# Patient Record
Sex: Female | Born: 1975 | Race: Black or African American | Hispanic: No | Marital: Married | Smoking: Current some day smoker
Health system: Southern US, Community
[De-identification: ages and names within clinical notes are randomized; demographics above are authoritative.]

## PROBLEM LIST (undated history)

## (undated) DIAGNOSIS — I10 Essential (primary) hypertension: Secondary | ICD-10-CM

## (undated) DIAGNOSIS — F1910 Other psychoactive substance abuse, uncomplicated: Secondary | ICD-10-CM

## (undated) DIAGNOSIS — G4730 Sleep apnea, unspecified: Secondary | ICD-10-CM

## (undated) DIAGNOSIS — D649 Anemia, unspecified: Secondary | ICD-10-CM

## (undated) DIAGNOSIS — F419 Anxiety disorder, unspecified: Secondary | ICD-10-CM

## (undated) DIAGNOSIS — J45909 Unspecified asthma, uncomplicated: Secondary | ICD-10-CM

## (undated) DIAGNOSIS — G4733 Obstructive sleep apnea (adult) (pediatric): Secondary | ICD-10-CM

## (undated) DIAGNOSIS — K219 Gastro-esophageal reflux disease without esophagitis: Secondary | ICD-10-CM

## (undated) DIAGNOSIS — F32A Depression, unspecified: Secondary | ICD-10-CM

## (undated) DIAGNOSIS — E119 Type 2 diabetes mellitus without complications: Secondary | ICD-10-CM

## (undated) DIAGNOSIS — T7840XA Allergy, unspecified, initial encounter: Secondary | ICD-10-CM

## (undated) DIAGNOSIS — M1990 Unspecified osteoarthritis, unspecified site: Secondary | ICD-10-CM

## (undated) HISTORY — DX: Unspecified asthma, uncomplicated: J45.909

## (undated) HISTORY — PX: KNEE ARTHROSCOPY: SUR90

## (undated) HISTORY — DX: Gastro-esophageal reflux disease without esophagitis: K21.9

## (undated) HISTORY — DX: Sleep apnea, unspecified: G47.30

## (undated) HISTORY — DX: Unspecified osteoarthritis, unspecified site: M19.90

## (undated) HISTORY — DX: Type 2 diabetes mellitus without complications: E11.9

## (undated) HISTORY — PX: BICEPS TENDON REPAIR: SHX566

## (undated) HISTORY — DX: Anemia, unspecified: D64.9

## (undated) HISTORY — DX: Essential (primary) hypertension: I10

## (undated) HISTORY — DX: Other psychoactive substance abuse, uncomplicated: F19.10

## (undated) HISTORY — PX: ROTATOR CUFF REPAIR: SHX139

## (undated) HISTORY — DX: Anxiety disorder, unspecified: F41.9

## (undated) HISTORY — DX: Allergy, unspecified, initial encounter: T78.40XA

## (undated) HISTORY — PX: FOOT SURGERY: SHX648

## (undated) HISTORY — DX: Obstructive sleep apnea (adult) (pediatric): G47.33

## (undated) HISTORY — DX: Depression, unspecified: F32.A

---

## 2000-07-28 ENCOUNTER — Emergency Department (HOSPITAL_COMMUNITY): Admission: EM | Admit: 2000-07-28 | Discharge: 2000-07-28 | Payer: Self-pay | Admitting: Emergency Medicine

## 2000-12-29 ENCOUNTER — Emergency Department (HOSPITAL_COMMUNITY): Admission: EM | Admit: 2000-12-29 | Discharge: 2000-12-29 | Payer: Self-pay

## 2001-07-09 ENCOUNTER — Encounter: Payer: Self-pay | Admitting: Emergency Medicine

## 2001-07-09 ENCOUNTER — Emergency Department (HOSPITAL_COMMUNITY): Admission: EM | Admit: 2001-07-09 | Discharge: 2001-07-09 | Payer: Self-pay | Admitting: Emergency Medicine

## 2002-08-20 ENCOUNTER — Emergency Department (HOSPITAL_COMMUNITY): Admission: EM | Admit: 2002-08-20 | Discharge: 2002-08-20 | Payer: Self-pay | Admitting: Emergency Medicine

## 2002-08-20 ENCOUNTER — Encounter: Payer: Self-pay | Admitting: Emergency Medicine

## 2004-03-13 ENCOUNTER — Emergency Department (HOSPITAL_COMMUNITY): Admission: EM | Admit: 2004-03-13 | Discharge: 2004-03-13 | Payer: Self-pay | Admitting: Family Medicine

## 2004-08-30 ENCOUNTER — Emergency Department (HOSPITAL_COMMUNITY): Admission: EM | Admit: 2004-08-30 | Discharge: 2004-08-30 | Payer: Self-pay | Admitting: Emergency Medicine

## 2005-04-12 ENCOUNTER — Emergency Department (HOSPITAL_COMMUNITY): Admission: EM | Admit: 2005-04-12 | Discharge: 2005-04-12 | Payer: Self-pay | Admitting: Emergency Medicine

## 2005-05-09 ENCOUNTER — Emergency Department (HOSPITAL_COMMUNITY): Admission: EM | Admit: 2005-05-09 | Discharge: 2005-05-09 | Payer: Self-pay | Admitting: Family Medicine

## 2005-05-31 ENCOUNTER — Encounter: Admission: RE | Admit: 2005-05-31 | Discharge: 2005-05-31 | Payer: Self-pay | Admitting: Cardiology

## 2005-07-30 ENCOUNTER — Other Ambulatory Visit: Admission: RE | Admit: 2005-07-30 | Discharge: 2005-07-30 | Payer: Self-pay | Admitting: Obstetrics and Gynecology

## 2005-09-05 ENCOUNTER — Encounter (INDEPENDENT_AMBULATORY_CARE_PROVIDER_SITE_OTHER): Payer: Self-pay | Admitting: Specialist

## 2005-09-05 ENCOUNTER — Ambulatory Visit (HOSPITAL_COMMUNITY): Admission: RE | Admit: 2005-09-05 | Discharge: 2005-09-06 | Payer: Self-pay | Admitting: Obstetrics and Gynecology

## 2005-11-27 ENCOUNTER — Encounter: Admission: RE | Admit: 2005-11-27 | Discharge: 2005-11-27 | Payer: Self-pay | Admitting: Orthopedic Surgery

## 2005-12-17 ENCOUNTER — Encounter: Admission: RE | Admit: 2005-12-17 | Discharge: 2005-12-17 | Payer: Self-pay | Admitting: Orthopedic Surgery

## 2006-02-19 HISTORY — PX: ABDOMINAL HYSTERECTOMY: SHX81

## 2006-12-13 ENCOUNTER — Encounter: Admission: RE | Admit: 2006-12-13 | Discharge: 2006-12-13 | Payer: Self-pay | Admitting: Family Medicine

## 2008-01-24 ENCOUNTER — Emergency Department (HOSPITAL_COMMUNITY): Admission: EM | Admit: 2008-01-24 | Discharge: 2008-01-24 | Payer: Self-pay | Admitting: Emergency Medicine

## 2008-03-16 ENCOUNTER — Emergency Department (HOSPITAL_COMMUNITY): Admission: EM | Admit: 2008-03-16 | Discharge: 2008-03-16 | Payer: Self-pay | Admitting: Emergency Medicine

## 2008-04-16 ENCOUNTER — Ambulatory Visit (HOSPITAL_BASED_OUTPATIENT_CLINIC_OR_DEPARTMENT_OTHER): Admission: RE | Admit: 2008-04-16 | Discharge: 2008-04-16 | Payer: Self-pay | Admitting: Family Medicine

## 2008-04-25 ENCOUNTER — Ambulatory Visit: Payer: Self-pay | Admitting: Internal Medicine

## 2008-06-28 DIAGNOSIS — Z6841 Body Mass Index (BMI) 40.0 and over, adult: Secondary | ICD-10-CM

## 2008-06-28 DIAGNOSIS — E039 Hypothyroidism, unspecified: Secondary | ICD-10-CM | POA: Insufficient documentation

## 2008-06-28 DIAGNOSIS — R079 Chest pain, unspecified: Secondary | ICD-10-CM

## 2008-06-29 ENCOUNTER — Encounter: Payer: Self-pay | Admitting: Pulmonary Disease

## 2009-02-03 ENCOUNTER — Encounter: Admission: RE | Admit: 2009-02-03 | Discharge: 2009-02-03 | Payer: Self-pay | Admitting: Family Medicine

## 2009-09-07 ENCOUNTER — Emergency Department (HOSPITAL_COMMUNITY): Admission: EM | Admit: 2009-09-07 | Discharge: 2009-09-07 | Payer: Self-pay | Admitting: Emergency Medicine

## 2009-10-25 ENCOUNTER — Observation Stay (HOSPITAL_COMMUNITY): Admission: EM | Admit: 2009-10-25 | Discharge: 2009-10-26 | Payer: Self-pay | Admitting: Emergency Medicine

## 2009-10-25 ENCOUNTER — Encounter (INDEPENDENT_AMBULATORY_CARE_PROVIDER_SITE_OTHER): Payer: Self-pay | Admitting: Internal Medicine

## 2009-11-02 ENCOUNTER — Encounter: Admission: RE | Admit: 2009-11-02 | Discharge: 2009-11-02 | Payer: Self-pay | Admitting: Family Medicine

## 2009-11-27 ENCOUNTER — Encounter: Admission: RE | Admit: 2009-11-27 | Discharge: 2009-11-27 | Payer: Self-pay | Admitting: Orthopaedic Surgery

## 2010-05-04 LAB — BASIC METABOLIC PANEL
CO2: 25 mEq/L (ref 19–32)
Calcium: 9.2 mg/dL (ref 8.4–10.5)
Chloride: 106 mEq/L (ref 96–112)
Creatinine, Ser: 0.9 mg/dL (ref 0.4–1.2)
Glucose, Bld: 98 mg/dL (ref 70–99)

## 2010-05-04 LAB — DIFFERENTIAL
Basophils Relative: 1 % (ref 0–1)
Eosinophils Absolute: 0.1 10*3/uL (ref 0.0–0.7)
Eosinophils Relative: 1 % (ref 0–5)
Monocytes Relative: 7 % (ref 3–12)
Neutrophils Relative %: 45 % (ref 43–77)

## 2010-05-04 LAB — CARDIAC PANEL(CRET KIN+CKTOT+MB+TROPI)
CK, MB: 1 ng/mL (ref 0.3–4.0)
CK, MB: 1.1 ng/mL (ref 0.3–4.0)
Relative Index: 0.5 (ref 0.0–2.5)
Relative Index: 0.5 (ref 0.0–2.5)
Relative Index: 0.5 (ref 0.0–2.5)
Total CK: 179 U/L — ABNORMAL HIGH (ref 7–177)
Total CK: 207 U/L — ABNORMAL HIGH (ref 7–177)
Troponin I: 0.01 ng/mL (ref 0.00–0.06)
Troponin I: 0.02 ng/mL (ref 0.00–0.06)

## 2010-05-04 LAB — HEPATIC FUNCTION PANEL
Albumin: 3.1 g/dL — ABNORMAL LOW (ref 3.5–5.2)
Alkaline Phosphatase: 51 U/L (ref 39–117)
Indirect Bilirubin: 0.5 mg/dL (ref 0.3–0.9)
Total Protein: 6.4 g/dL (ref 6.0–8.3)

## 2010-05-04 LAB — CBC
Hemoglobin: 13 g/dL (ref 12.0–15.0)
MCH: 30.1 pg (ref 26.0–34.0)
MCHC: 34.3 g/dL (ref 30.0–36.0)
MCV: 87.8 fL (ref 78.0–100.0)
Platelets: 174 10*3/uL (ref 150–400)

## 2010-05-04 LAB — LIPASE, BLOOD: Lipase: 28 U/L (ref 11–59)

## 2010-05-04 LAB — LIPID PANEL
Cholesterol: 140 mg/dL (ref 0–200)
LDL Cholesterol: 82 mg/dL (ref 0–99)
Triglycerides: 44 mg/dL (ref <150)
VLDL: 9 mg/dL (ref 0–40)

## 2010-05-04 LAB — TSH: TSH: 2.693 u[IU]/mL (ref 0.350–4.500)

## 2010-05-04 LAB — POCT CARDIAC MARKERS: Myoglobin, poc: 88.3 ng/mL (ref 12–200)

## 2010-06-05 LAB — POCT CARDIAC MARKERS
CKMB, poc: <1 ng/mL — ABNORMAL LOW (ref 1.0–8.0)
Troponin i, poc: <0.05 ng/mL (ref 0.00–0.09)

## 2010-06-05 LAB — BASIC METABOLIC PANEL
BUN: 8 mg/dL (ref 6–23)
CO2: 24 mEq/L (ref 19–32)
Calcium: 9.1 mg/dL (ref 8.4–10.5)
Chloride: 105 mEq/L (ref 96–112)
Creatinine, Ser: 0.75 mg/dL (ref 0.4–1.2)
Glucose, Bld: 98 mg/dL (ref 70–99)

## 2010-06-05 LAB — CBC
Platelets: 189 10*3/uL (ref 150–400)
RDW: 13.7 % (ref 11.5–15.5)
WBC: 5 10*3/uL (ref 4.0–10.5)

## 2010-06-05 LAB — COMPREHENSIVE METABOLIC PANEL
ALT: 13 U/L (ref 0–35)
AST: 23 U/L (ref 0–37)
Albumin: 3.5 g/dL (ref 3.5–5.2)
Alkaline Phosphatase: 67 U/L (ref 39–117)
GFR calc Af Amer: >60 mL/min (ref >60)
Potassium: 3.9 mEq/L (ref 3.5–5.1)
Sodium: 136 mEq/L (ref 135–145)
Total Protein: 7.6 g/dL (ref 6.0–8.3)

## 2010-06-05 LAB — D-DIMER, QUANTITATIVE: D-Dimer, Quant: 0.8 ug/mL-FEU — ABNORMAL HIGH (ref 0.00–0.48)

## 2010-07-04 NOTE — Procedures (Signed)
Monica [name], Monica [name] NO.:  [id number]   MEDICAL RECORD NO.:  [id number]          PATIENT TYPE:  OUT   LOCATION:  SLEEP CENTER                 FACILITY:  [facility]   PHYSICIAN:  Clinton D. Maple Hudson, MD, FCCP, FACPDATE OF BIRTH:  1973/07/23   DATE OF STUDY:  04/16/2008                            NOCTURNAL POLYSOMNOGRAM   REFERRING PHYSICIAN:   REFERRING PHYSICIAN:  Lillia Carmel, MD   DATE OF THE STUDY:  April 16, 2008   INDICATION FOR STUDY:  Hypersomnia with sleep apnea.   EPWORTH SLEEPINESS SCORE:  10/24, BMI 39.  Weight 304 pounds, height 74  inches.  Neck 17 inches.   MEDICATIONS:  Charted and reviewed.   SLEEP ARCHITECTURE:  Split study protocol.  During the diagnostic phase,  total sleep time was 134.5 minutes with sleep efficiency 83%.  Stage I  was 11.9%, stage II 58%, stage III absent, REM 30.1% of total sleep  time.  Sleep latency 20 minutes, REM latency 48.5 minutes, awake after  sleep onset 7.5 minutes, arousal index 14.7.  No bedtime medication was  taken.   RESPIRATORY DATA:  Split study protocol.  Apnea/hypopnea index (AHI)  13.8 per hour.  A total of 31 events was scored, all as hypopneas.  Events were not positional.  REM AHI 37.  CPAP was then titrated to 10  CWP, AHI 0 per hour.  She wore a medium ResMed full-face Quattro mask  with heated humidifier.   OXYGEN DATA:  Moderately loud snoring with oxygen desaturation to a  nadir of 89% before CPAP.  After CPAP control, snoring was prevented and  mean oxygen saturation held 95.3% on room air.   CARDIAC DATA:  Normal sinus rhythm.   MOVEMENT-PARASOMNIA:  No significant movement disturbance.  No bathroom  trips.   IMPRESSIONS-RECOMMENDATIONS:  1. Mild obstructive sleep apnea/hypopnea syndrome, apnea-hypopnea      index 13.8 per hour with non-positional events, all hypopneas.      Moderately loud snoring with oxygen desaturation to a nadir of 89%      on room air.  2. Successful  continuous positive airway pressure titration to 10      centimeters of water pressure, apnea-hypopnea index 0 per hour.      She wore a medium ResMed full-face Quattro mask with heated      humidifier.      Clinton D. Maple Hudson, MD, [facility], FACP  Diplomate, Biomedical engineer of Sleep Medicine  Electronically Signed     CDY/MEDQ  D:  04/24/2008 09:41:39  T:  04/24/2008 23:51:52  Job:  [id number]

## 2010-07-07 NOTE — H&P (Signed)
[name], [name] NO.:  [id number]   MEDICAL RECORD NO.:  [id number]          PATIENT TYPE:  AMB   LOCATION:                                FACILITY:  [facility]   PHYSICIAN:  Janine Limbo, M.D.DATE OF BIRTH:  March 23, 1975   DATE OF ADMISSION:  09/05/2005  DATE OF DISCHARGE:                                HISTORY & PHYSICAL   HISTORY OF PRESENT ILLNESS:  [name] is a 35 year old female, para 1-  0-0-1, who presents with menorrhagia, dysmenorrhea, anemia, and fibroids.  The patient has been followed at the [facility] and  Gynecology division of [facility].  She presents at this  time for vaginal hysterectomy.  Her hemoglobin has been as low as 8.8.  her  gonorrhea and chlamydia cultures were negative.  Her Pap smear was within  normal limits.  An endometrial biopsy was performed that showed benign  elements.  The patient had an ultrasound performed which showed a uterus  that measures 9.8 x 7.1 cm and several fibroids were noted.  The ovaries  appeared normal.  The patient denies a prior history of sexually transmitted  infections.   PAST MEDICAL HISTORY:  The patient reports hypertension of pregnancy as well  as diabetes of pregnancy.  Her current medications include Imitrex for  frequent headaches. She also takes iron for her anemia.  She has had surgery on her wisdom teeth as well as surgery on her knee.   DRUG ALLERGIES:  Codeine, but the patient does tolerate Vicodin.   OBSTETRICAL HISTORY:  The patient has had one term vaginal delivery.   SOCIAL HISTORY:  The patient smokes cigarettes.  She drinks alcohol  socially.  She denies other recreational drug uses.   REVIEW OF SYSTEMS:  Please see History of Present Illness.   FAMILY HISTORY:  The patient has a family history of heart disease, thyroid  disease, hypertension, diabetes, strokes, cancer, and joint problems.   PHYSICAL EXAMINATION:  VITAL SIGNS:   Height is 6 feet, weight is 250 pounds.  HEENT:  Is within normal limits.  CHEST:  The chest is clear.  HEART:  Regular rate and rhythm.  BREASTS:  Her breasts are without masses.  ABDOMEN: Her abdomen is nontender and no masses are appreciated.  EXTREMITIES:  Her extremities are within normal limits.  NEUROLOGIC:  Her neurologic exam is grossly normal.  PELVIC EXAM:  External genitalia is normal.  The vaginal is normal.  The  cervix is nontender.  The uterus is upper limits normal size.  Adnexa -  masses are appreciated and rectovaginal exam confirms.   ASSESSMENT:  1.  Fibroid uterus.  2.  Menorrhagia.  3.  Dysmenorrhea.  4.  Anemia.   PLAN:  The patient will undergo a vaginal hysterectomy.  She has tried  nonsteroidal antiinflammatory agents and she continues to have her  difficulties.  She declines long term hormonal therapy.  The patient  understands the indications for her surgical procedure and she accepts the  risk of, but not limited to, anesthetic complications, bleeding,  infection,  and possible damage to the surrounding organs.      Janine Limbo, M.D.  Electronically Signed     AVS/MEDQ  D:  09/03/2005  T:  09/03/2005  Job:  [id number]   cc:   Osvaldo Shipper. Spruill, M.D.  Fax: [phone]

## 2010-07-07 NOTE — Op Note (Signed)
Monica [name], Monica [name] NO.:  [id number]   MEDICAL RECORD NO.:  [id number]          PATIENT TYPE:  OIB   LOCATION:  9313                          FACILITY:  [facility]   PHYSICIAN:  Janine Limbo, M.D.DATE OF BIRTH:  [birth year]   DATE OF PROCEDURE:  09/05/2005  DATE OF DISCHARGE:                                 OPERATIVE REPORT   PREOPERATIVE DIAGNOSES:  1.  Fibroid uterus.  2.  Menorrhagia  3.  Dysmenorrhea.  4.  Anemia (hemoglobin 10.1).   POSTOPERATIVE DIAGNOSES:  1.  Fibroid uterus.  2.  Menorrhagia  3.  Dysmenorrhea.  4.  Anemia (hemoglobin 10.1).   PROCEDURE:  Vaginal hysterectomy.   SURGEON:  Dr. Leonard Schwartz.   FIRST ASSISTANT:  Crist Fat. Rivard, M.D.   ANESTHESIA:  General.   DISPOSITION:  Monica [name] is a 35 year old female, para 1-0-0-1, who presents  with the above-mentioned diagnosis.  Her hemoglobin has been as low as 8.8.  She understands the indications for her surgical procedure and she accepts  the risk of, but not limited to, anesthetic complications, bleeding,  infection, and possible damage to the surrounding organs.   FINDINGS:  The patient was noted to have a fibroid uterus that weighed  approximately 170 g.  The fallopian tubes and the ovaries appeared normal.   PROCEDURE:  The patient was taken to the operating room where a general  anesthetic was given.  The patient's abdomen, perineum, and vagina were  prepped with multiple layers of Betadine.  A Foley catheter was placed in  the bladder.  Examination under anesthesia was performed.  The patient was  then sterilely draped.  The cervix was injected with 28 mL of a diluted  solution of Pitressin and saline.  A circumferential incision was made  around the cervix and the vaginal mucosa was advanced anteriorly and  posteriorly.  The posterior cul-de-sac was sharply entered.  The anterior  cul-de-sac was then sharply entered.  Alternating from right to left, the  uterosacral ligaments, the paracervical tissues, the parametrial tissues,  the uterine arteries, and the upper pedicles were clamped, cut, sutured, and  tied securely.  The uterus was inverted through the posterior colpotomy.  The remainder of the upper pedicles were then secured using clamps and the  uterus was transected from our operative field.  The upper pedicles were  secured using a free tie and then a suture ligature.  Hemostasis was noted  to be adequate.  The sutures attached to the uterosacral ligaments were  brought out through the vaginal angles and tied securely.  A McCall  culdoplasty suture was placed in the posterior cul-de-sac incorporating the  uterosacral ligaments bilaterally and the posterior peritoneum.  A final  check was made for hemostasis and hemostasis was noted to be adequate.  The  vaginal cuff was then closed using figure-of-eight sutures incorporating the  anterior vaginal mucosa, the anterior peritoneum, the posterior peritoneum,  and then the posterior vaginal mucosa.  The McCall culdoplasty suture was  tied securely and the apex of the vagina was noted to elevate into  the mid  pelvis.  Sponge, needle and instrument counts were correct on two occasions.  The estimated blood loss for the procedure was 150 mL.  The patient  tolerated her procedure well.  The 0 Vicryl was the suture material used  throughout the procedure.  The patient was awakened from her anesthetic and  taken to the recovery room in stable condition.  The patient was noted to  have approximately 350 mL of clear urine in her Foley bag.  The uterus was  sent to pathology for evaluation.      Janine Limbo, M.D.  Electronically Signed     AVS/MEDQ  D:  09/05/2005  T:  09/05/2005  Job:  [id number]   cc:   Osvaldo Shipper. Spruill, M.D.  Fax: [phone]

## 2010-07-07 NOTE — Discharge Summary (Signed)
NAMESEDRA, MORFIN NO.:  [id number]   MEDICAL RECORD NO.:  [id number]          PATIENT TYPE:  OIB   LOCATION:  9313                          FACILITY:  [facility]   PHYSICIAN:  Janine Limbo, M.D.DATE OF BIRTH:  [birth year]   DATE OF ADMISSION:  09/05/2005  DATE OF DISCHARGE:  09/06/2005                                 DISCHARGE SUMMARY   DISCHARGE DIAGNOSES:  1.  Fibroid uterus.  2.  Menorrhagia.  3.  Dysmenorrhea.  4.  Anemia (hemoglobin 9.1).   PROCEDURE THIS ADMISSION:  September 05, 2005: Vaginal hysterectomy.   HISTORY OF PRESENT ILLNESS:  The patient is a 35 year old female with the  above-mentioned diagnoses.  She presents for a vaginal hysterectomy.  Please  see dictated HISTORY AND PHYSICAL EXAM for details.   ADMISSION EXAM:  The patient's uterus was upper limits of normal size and  fibroids were noted.   HOSPITAL COURSE:  On the day of admission, the patient underwent a vaginal  hysterectomy.  Operative findings included a 170-gram uterus with multiple  fibroids.  The fallopian tubes and ovaries appeared normal.  The patient  tolerated her procedure well.  The estimated blood loss was 150 mL.  The  postoperative course was uneventful.  She remained afebrile.  She quickly  tolerated a regular diet.  Her postoperative hemoglobin was 9.1  (preoperative hemoglobin 10.1).  On postoperative day #1 the patient was  felt to be ready for discharge.   DISCHARGE MEDICATIONS:  1.  Vicodin 0.2 p.o. q.4 h p.r.n. pain.  2.  Ibuprofen 100 mg q.8 h p.r.n. pain.  3.  Iron 325 mg twice each day.  4.  Phenergan 25 mg every 6 hours as needed for nausea.   DISCHARGE INSTRUCTIONS:  The patient will return to see Dr. Stefano Gaul 6 weeks  for follow-up examination.  She will refrain from driving for 2 weeks, heavy  lifting for 4 weeks, and vaginal entry for 6 weeks.  She will call for  questions or concerns.  She will call for a temperature greater than or  equal to 100.4.   She was given a copy of the postoperative instruction sheet  as prepared [facility] and Gynecology.   FINAL PATHOLOGY REPORT:  Pending.      Janine Limbo, M.D.  Electronically Signed     AVS/MEDQ  D:  09/06/2005  T:  09/06/2005  Job:  [id number]   cc:   Osvaldo Shipper. Spruill, M.D.  Fax: [phone]

## 2010-11-01 ENCOUNTER — Emergency Department (HOSPITAL_COMMUNITY): Payer: BC Managed Care – PPO

## 2010-11-01 ENCOUNTER — Emergency Department (HOSPITAL_COMMUNITY)
Admission: EM | Admit: 2010-11-01 | Discharge: 2010-11-01 | Disposition: A | Discharge status: Home / Self Care | Payer: BC Managed Care – PPO | Attending: Emergency Medicine | Admitting: Emergency Medicine

## 2010-11-01 DIAGNOSIS — M25519 Pain in unspecified shoulder: Secondary | ICD-10-CM | POA: Insufficient documentation

## 2010-11-01 DIAGNOSIS — M546 Pain in thoracic spine: Secondary | ICD-10-CM | POA: Insufficient documentation

## 2010-11-01 DIAGNOSIS — M79609 Pain in unspecified limb: Secondary | ICD-10-CM | POA: Insufficient documentation

## 2010-11-01 DIAGNOSIS — K219 Gastro-esophageal reflux disease without esophagitis: Secondary | ICD-10-CM | POA: Insufficient documentation

## 2010-11-01 DIAGNOSIS — M7989 Other specified soft tissue disorders: Secondary | ICD-10-CM

## 2010-11-01 DIAGNOSIS — R079 Chest pain, unspecified: Secondary | ICD-10-CM | POA: Insufficient documentation

## 2010-11-01 LAB — DIFFERENTIAL
Basophils Relative: 1 % (ref 0–1)
Eosinophils Absolute: 0.1 10*3/uL (ref 0.0–0.7)
Lymphs Abs: 2.1 10*3/uL (ref 0.7–4.0)
Neutro Abs: 1.3 10*3/uL — ABNORMAL LOW (ref 1.7–7.7)
Neutrophils Relative %: 34 % — ABNORMAL LOW (ref 43–77)

## 2010-11-01 LAB — D-DIMER, QUANTITATIVE: D-Dimer, Quant: 0.38 ug/mL-FEU (ref 0.00–0.48)

## 2010-11-01 LAB — POCT I-STAT, CHEM 8
Calcium, Ion: 1.2 mmol/L (ref 1.12–1.32)
Chloride: 105 mEq/L (ref 96–112)
HCT: 39 % (ref 36.0–46.0)
Sodium: 139 mEq/L (ref 135–145)

## 2010-11-01 LAB — CBC
MCV: 85 fL (ref 78.0–100.0)
Platelets: 174 10*3/uL (ref 150–400)
RBC: 4.21 MIL/uL (ref 3.87–5.11)
WBC: 3.9 10*3/uL — ABNORMAL LOW (ref 4.0–10.5)

## 2010-11-01 LAB — POCT I-STAT TROPONIN I: Troponin i, poc: 0.01 ng/mL (ref 0.00–0.08)

## 2013-01-25 ENCOUNTER — Emergency Department (HOSPITAL_COMMUNITY)
Admission: EM | Admit: 2013-01-25 | Discharge: 2013-01-25 | Disposition: A | Discharge status: Home / Self Care | Payer: BC Managed Care – PPO | Attending: Emergency Medicine | Admitting: Emergency Medicine

## 2013-01-25 ENCOUNTER — Encounter (HOSPITAL_COMMUNITY): Payer: Self-pay | Admitting: Emergency Medicine

## 2013-01-25 ENCOUNTER — Emergency Department (HOSPITAL_COMMUNITY): Payer: BC Managed Care – PPO

## 2013-01-25 DIAGNOSIS — R0602 Shortness of breath: Secondary | ICD-10-CM | POA: Insufficient documentation

## 2013-01-25 DIAGNOSIS — Z79899 Other long term (current) drug therapy: Secondary | ICD-10-CM | POA: Insufficient documentation

## 2013-01-25 DIAGNOSIS — IMO0002 Reserved for concepts with insufficient information to code with codable children: Secondary | ICD-10-CM | POA: Insufficient documentation

## 2013-01-25 DIAGNOSIS — J3489 Other specified disorders of nose and nasal sinuses: Secondary | ICD-10-CM | POA: Insufficient documentation

## 2013-01-25 DIAGNOSIS — IMO0001 Reserved for inherently not codable concepts without codable children: Secondary | ICD-10-CM | POA: Insufficient documentation

## 2013-01-25 DIAGNOSIS — J029 Acute pharyngitis, unspecified: Secondary | ICD-10-CM | POA: Insufficient documentation

## 2013-01-25 DIAGNOSIS — R0982 Postnasal drip: Secondary | ICD-10-CM | POA: Insufficient documentation

## 2013-01-25 DIAGNOSIS — B9789 Other viral agents as the cause of diseases classified elsewhere: Secondary | ICD-10-CM | POA: Insufficient documentation

## 2013-01-25 DIAGNOSIS — J069 Acute upper respiratory infection, unspecified: Secondary | ICD-10-CM

## 2013-01-25 DIAGNOSIS — B349 Viral infection, unspecified: Secondary | ICD-10-CM

## 2013-01-25 DIAGNOSIS — F17200 Nicotine dependence, unspecified, uncomplicated: Secondary | ICD-10-CM | POA: Insufficient documentation

## 2013-01-25 DIAGNOSIS — R5381 Other malaise: Secondary | ICD-10-CM | POA: Insufficient documentation

## 2013-01-25 MED ORDER — BENZONATATE 100 MG PO CAPS: 100.0000 mg | ORAL_CAPSULE | Freq: Three times a day (TID) | ORAL | Status: DC

## 2013-01-25 MED ORDER — SALINE SPRAY 0.65 % NA SOLN: 1.0000 | NASAL | Status: DC | PRN

## 2013-01-25 MED ORDER — OXYMETAZOLINE HCL 0.05 % NA SOLN: 1.0000 | Freq: Two times a day (BID) | NASAL | Status: DC

## 2013-01-25 MED ORDER — IBUPROFEN 600 MG PO TABS: 600.0000 mg | ORAL_TABLET | Freq: Four times a day (QID) | ORAL | Status: DC | PRN

## 2013-01-25 NOTE — ED Provider Notes (Signed)
CSN: [id number]     Arrival date & time 01/25/13  2440 History   First MD Initiated Contact with Patient 01/25/13 [phone]     Chief Complaint  Patient presents with  . URI   (Consider location/radiation/quality/duration/timing/severity/associated sxs/prior Treatment) HPI Comments: SUBJECTIVE:  Monica [name] is a 37 y.o. female who complains of congestion, sore throat, post nasal drip, dry cough, headache, bilateral sinus pain and clear nasal discharge for 14 days, worse since last the last 3 days. She denies a history of chills, fevers, and nausea and denies a history of asthma. Patient denies smoke cigarettes. ROS is also + for myalgias.    Patient is a 37 y.o. female presenting with URI. The history is provided by the patient.  URI Presenting symptoms: congestion, cough, fatigue, rhinorrhea and sore throat   Presenting symptoms: no fever   Associated symptoms: myalgias   Associated symptoms: no wheezing     History reviewed. No pertinent past medical history. Past Surgical History  Procedure Laterality Date  . Foot surgery      Left  . Knee arthroscopy    . Rotator cuff repair Left   . Abdominal hysterectomy     History reviewed. No pertinent family history. History  Substance Use Topics  . Smoking status: Current Some Day Smoker -- 0.50 packs/day  . Smokeless tobacco: Not on file  . Alcohol Use: No   OB History   Grav Para Term Preterm Abortions TAB SAB Ect Mult Living                 Review of Systems  Constitutional: Positive for fatigue. Negative for fever and chills.  HENT: Positive for congestion, postnasal drip, rhinorrhea, sinus pressure, sore throat and voice change. Negative for drooling and trouble swallowing.   Eyes: Negative for discharge.  Respiratory: Positive for cough and shortness of breath. Negative for chest tightness and wheezing.   Musculoskeletal: Positive for myalgias.    Allergies  Codeine  Home Medications   Current Outpatient Rx  Name   Route  Sig  Dispense  Refill  . fluticasone (FLONASE) 50 MCG/ACT nasal spray   Each Nare   Place 2 sprays into both nostrils daily as needed for allergies or rhinitis.         . FUROSEMIDE PO   Oral   Take 1 tablet by mouth daily.         . benzonatate (TESSALON) 100 MG capsule   Oral   Take 1 capsule (100 mg total) by mouth every 8 (eight) hours.   21 capsule   0   . ibuprofen (ADVIL,MOTRIN) 600 MG tablet   Oral   Take 1 tablet (600 mg total) by mouth every 6 (six) hours as needed.   30 tablet   0   . oxymetazoline (AFRIN NASAL SPRAY) 0.05 % nasal spray   Each Nare   Place 1 spray into both nostrils 2 (two) times daily.   15 mL   0     May cause rebound blood pressure elevation, use it ...   . sodium chloride (OCEAN) 0.65 % SOLN nasal spray   Each Nare   Place 1 spray into both nostrils as needed for congestion.   30 mL   0    BP 116/74  Pulse 79  Temp(Src) 98.2 F (36.8 C) (Oral)  Resp 18  Ht 6\' 2"  (1.88 m)  Wt 310 lb (140.615 kg)  BMI 39.78 kg/m2  SpO2 100% Physical Exam  Nursing  note and vitals reviewed. Constitutional: She is oriented to person, place, and time. She appears well-developed and well-nourished.  HENT:  Head: Normocephalic and atraumatic.  Eyes: EOM are normal. Pupils are equal, round, and reactive to light.  Neck: Neck supple. No JVD present.  Cardiovascular: Normal rate, regular rhythm and normal heart sounds.   No murmur heard. Pulmonary/Chest: Effort normal. No respiratory distress. She has no wheezes.  Abdominal: Soft. She exhibits no distension. There is no tenderness. There is no rebound and no guarding.  Neurological: She is alert and oriented to person, place, and time.  Skin: Skin is warm and dry.    ED Course  Procedures (including critical care time) Labs Review Labs Reviewed - No data to display Imaging Review Dg Chest 2 View  01/25/2013   CLINICAL DATA:  Productive cough and chest pain. History of tobacco use  EXAM:  CHEST  2 VIEW  COMPARISON:  Chest x-ray of November 01, 2010.  FINDINGS: The lungs are well-expanded. There is no focal infiltrate. The central hilar structures are prominent but stable. The cardiopericardial silhouette is normal in size. The mediastinum is normal in width. There is no pleural effusion or pneumothorax. The observed portions of the bony thorax exhibit no acute abnormalities.  IMPRESSION: There is no evidence of pneumonia nor CHF nor other acute cardiopulmonary abnormality.   Electronically Signed   By: David  [place]   On: 01/25/2013 11:02    EKG Interpretation    Date/Time:  Sunday January 25 2013 09:02:26 EST Ventricular Rate:  79 PR Interval:  144 QRS Duration: 76 QT Interval:  378 QTC Calculation: 433 R Axis:   66 Text Interpretation:  Normal sinus rhythm Normal ECG Normal intervals Confirmed by [place], MD, Cesia Orf ([id number]) on 01/25/2013 9:11:19 AM            MDM   1. Viral syndrome   2. Acute URI    DDX includes: Viral syndrome Influenza Pharyngitis Sinusitis Mononucleosis Electrolyte abnormality  Pt comes in with cc of congestion, cough, sinus headaches. Chest pain with cough. No fevers. CXr is clear, lung exam was clear. Sx going on for few days now. Likely a viral syndrome. Will give meds for symptom control.  Derwood Kaplan, MD 01/25/13 1145

## 2013-01-25 NOTE — ED Notes (Signed)
Dr. Nanivati at bedside  

## 2013-01-25 NOTE — ED Notes (Signed)
Pt from home c/o cough, HA, head congestion, and ear pain x 2 weeks Pt states mild symptoms getting worse last Thurs. Pt is afebrile. Complains of chest pressure.

## 2014-03-11 ENCOUNTER — Other Ambulatory Visit (INDEPENDENT_AMBULATORY_CARE_PROVIDER_SITE_OTHER): Payer: Self-pay | Admitting: Otolaryngology

## 2014-03-11 DIAGNOSIS — J0101 Acute recurrent maxillary sinusitis: Secondary | ICD-10-CM

## 2015-11-28 ENCOUNTER — Ambulatory Visit (INDEPENDENT_AMBULATORY_CARE_PROVIDER_SITE_OTHER): Payer: Worker's Compensation | Admitting: Orthopaedic Surgery

## 2015-11-28 DIAGNOSIS — M25511 Pain in right shoulder: Secondary | ICD-10-CM | POA: Diagnosis not present

## 2015-11-29 ENCOUNTER — Ambulatory Visit (INDEPENDENT_AMBULATORY_CARE_PROVIDER_SITE_OTHER): Payer: Self-pay | Admitting: Orthopaedic Surgery

## 2016-01-24 ENCOUNTER — Encounter (INDEPENDENT_AMBULATORY_CARE_PROVIDER_SITE_OTHER): Payer: Self-pay | Admitting: Orthopaedic Surgery

## 2016-01-24 ENCOUNTER — Ambulatory Visit (INDEPENDENT_AMBULATORY_CARE_PROVIDER_SITE_OTHER): Payer: Worker's Compensation | Admitting: Orthopaedic Surgery

## 2016-01-24 VITALS — BP 176/102 | HR 90

## 2016-01-24 DIAGNOSIS — M25511 Pain in right shoulder: Secondary | ICD-10-CM

## 2016-01-24 DIAGNOSIS — G8929 Other chronic pain: Secondary | ICD-10-CM | POA: Diagnosis not present

## 2016-01-24 NOTE — Progress Notes (Signed)
Office Visit Note   Patient: Monica [name]           Date of Birth: Mar 27, 1975           MRN: [id number] Visit Date: 01/24/2016              Requested by: No referring provider defined for this encounter. PCP: Pcp Not In System   Assessment & Plan: Visit Diagnoses:  1. Chronic right shoulder pain    Patient originally had a short rest with the glenoid repair this did well for. Time and then later she had the on-the-job injury some years later with the biceps tendon tear and rotator cuff tear. She had biceps tenodesis and also rotator cuff repair but continues to have problems of outstretched reaching overhead activities and has been restricted from doing these 2 activities with the weights more than 15 pounds.         Plan: Patient is reached maximal medical medical improvement concerning her right arm. Surgery that was in March 2017 include biceps tenodesis and also rotator cuff repair. Based on the surgery done with persistent symptoms are greater impairment at 15% of the right arm. Patient is at [place]. She is under work  restrictions avoiding outstretched lifting with the right arm more than 15 pounds and avoid overhead lifting more than 15 pounds. Office follow-up when necessary she is aware of for her restrictions recommendations and is aware of what activities tend aggravate her shoulder. Bess Kinds RN was the medical case manager is present for her exam today and part of the discussion. Fax is [phone].  Follow-Up Instructions: No Follow-up on file.   Orders:  No orders of the defined types were placed in this encounter.  No orders of the defined types were placed in this encounter.     Procedures: No procedures performed   Clinical Data: No additional findings.   Subjective: Chief Complaint  Patient presents with  . Right Shoulder - Pain    Patient returns for three month follow up.  She is continuing to have spasms in her right arm. She states that her  arm hurts everyday. She is at a 5/6 out of 10 on pain scale. She is status post right shoulder scope and rotator cuff repair on 05/09/2015. She is continuing modified duty at work. She describes the pain as sharp and deep.    Review of Systems 14 point review of systems updated and is unchanged.   Objective: Vital Signs: BP (!) 176/102   Pulse 90   Physical Exam patient has good cervical range of motion she continues to have problems with right hand outstretched reaching and overhead activities and still has some discomfort with impingement test. She's been through therapy strengthening exercises  some  tubing bands at home that she continue doing her strengthening exercises as instructed.   Ortho Exam arthroscopic portals are well-healed. Shows full extension the elbow good cessation of the hand good cervical range of motion.  Specialty Comments:  No specialty comments available.  Imaging: No results found.   PMFS History: Patient Active Problem List   Diagnosis Date Noted  . HYPOTHYROIDISM 06/28/2008  . OBESITY, MORBID 06/28/2008  . CHEST PAIN 06/28/2008   No past medical history on file.  No family history on file.  Past Surgical History:  Procedure Laterality Date  . ABDOMINAL HYSTERECTOMY    . FOOT SURGERY     Left  . KNEE ARTHROSCOPY    . ROTATOR CUFF REPAIR  Left    Social History   Occupational History  . Not on file.   Social History Main Topics  . Smoking status: Current Some Day Smoker    Packs/day: 0.50  . Smokeless tobacco: Not on file  . Alcohol use No  . Drug use: Unknown  . Sexual activity: Not on file

## 2016-02-28 ENCOUNTER — Ambulatory Visit (INDEPENDENT_AMBULATORY_CARE_PROVIDER_SITE_OTHER): Payer: Self-pay | Admitting: Orthopaedic Surgery

## 2016-03-16 NOTE — Addendum Note (Signed)
Addended by: Marybelle Killings on: 03/16/2016 02:15 PM   Modules accepted: Level of Service

## 2016-04-19 ENCOUNTER — Telehealth (INDEPENDENT_AMBULATORY_CARE_PROVIDER_SITE_OTHER): Payer: Self-pay | Admitting: *Deleted

## 2016-04-19 DIAGNOSIS — E119 Type 2 diabetes mellitus without complications: Secondary | ICD-10-CM

## 2016-04-19 HISTORY — DX: Type 2 diabetes mellitus without complications: E11.9

## 2016-04-19 NOTE — Telephone Encounter (Signed)
Please advise 

## 2016-04-19 NOTE — Telephone Encounter (Signed)
Patient called in this afternoon in regards to needing a latter from Dr. Lorin Mercy. She is wanting to get an Ergonomic chair to help with her arm and shoulder pain? Her CB # [phone]. Thank you

## 2016-04-22 NOTE — Telephone Encounter (Signed)
OK to do thanks 

## 2016-04-24 ENCOUNTER — Encounter: Payer: Self-pay | Admitting: Family Medicine

## 2016-04-24 ENCOUNTER — Ambulatory Visit (INDEPENDENT_AMBULATORY_CARE_PROVIDER_SITE_OTHER): Payer: BC Managed Care – PPO | Admitting: Family Medicine

## 2016-04-24 VITALS — BP 128/90 | HR 92 | Resp 12 | Ht 74.0 in | Wt 341.4 lb

## 2016-04-24 DIAGNOSIS — Z6841 Body Mass Index (BMI) 40.0 and over, adult: Secondary | ICD-10-CM | POA: Diagnosis not present

## 2016-04-24 DIAGNOSIS — Z800 Family history of malignant neoplasm of digestive organs: Secondary | ICD-10-CM | POA: Diagnosis not present

## 2016-04-24 DIAGNOSIS — Z1231 Encounter for screening mammogram for malignant neoplasm of breast: Secondary | ICD-10-CM | POA: Diagnosis not present

## 2016-04-24 DIAGNOSIS — E1165 Type 2 diabetes mellitus with hyperglycemia: Secondary | ICD-10-CM

## 2016-04-24 DIAGNOSIS — I10 Essential (primary) hypertension: Secondary | ICD-10-CM | POA: Diagnosis not present

## 2016-04-24 DIAGNOSIS — Z23 Encounter for immunization: Secondary | ICD-10-CM

## 2016-04-24 DIAGNOSIS — Z1239 Encounter for other screening for malignant neoplasm of breast: Secondary | ICD-10-CM

## 2016-04-24 LAB — HEMOGLOBIN A1C: Hgb A1c MFr Bld: 8.4 % — ABNORMAL HIGH (ref 4.6–6.5)

## 2016-04-24 LAB — BASIC METABOLIC PANEL
BUN: 8 mg/dL (ref 6–23)
CALCIUM: 9.4 mg/dL (ref 8.4–10.5)
CO2: 31 meq/L (ref 19–32)
CREATININE: 0.66 mg/dL (ref 0.40–1.20)
Chloride: 101 mEq/L (ref 96–112)
GFR: 125.67 mL/min (ref >60.00)
GLUCOSE: 161 mg/dL — AB (ref 70–99)
Potassium: 4.4 mEq/L (ref 3.5–5.1)
Sodium: 137 mEq/L (ref 135–145)

## 2016-04-24 LAB — LIPID PANEL
CHOLESTEROL: 160 mg/dL (ref 0–200)
HDL: 55.1 mg/dL (ref >39.00)
LDL Cholesterol: 87 mg/dL (ref 0–99)
NonHDL: 104.92
TRIGLYCERIDES: 89 mg/dL (ref 0.0–149.0)
Total CHOL/HDL Ratio: 3
VLDL: 17.8 mg/dL (ref 0.0–40.0)

## 2016-04-24 MED ORDER — AMLODIPINE BESYLATE 2.5 MG PO TABS: 2.5000 mg | ORAL_TABLET | Freq: Every day | ORAL | 0 refills | Status: DC

## 2016-04-24 MED ORDER — LISINOPRIL-HYDROCHLOROTHIAZIDE 10-12.5 MG PO TABS: 1.0000 | ORAL_TABLET | Freq: Every day | ORAL | 1 refills | Status: DC

## 2016-04-24 NOTE — Progress Notes (Signed)
Pre visit review using our clinic review tool, if applicable. No additional management support is needed unless otherwise documented below in the visit note. 

## 2016-04-24 NOTE — Progress Notes (Signed)
HPI:   Monica [name] is a 41 y.o. female, who is here today to establish care.  Former PCP: N/A. Last preventive routine visit: Many years ago,she has not had mammogram done and s/p hysterectomy.  Chronic medical problems: HTN,obesity,knee OA  She follows with ortho,has work permanent restrictions due to shoulder injury after work related accident.  Hypertension:   Dx about a year ago. Currently on Lisinopril-HCTZ 10-12.5 mg daily.   BP's at home > 140/90 a few times.. She is taking medications as instructed, no side effects reported.  She has not noted unusual headache, visual changes, exertional chest pain, dyspnea,  focal weakness, or edema.   Lab Results  Component Value Date   CREATININE 0.80 11/01/2010   BUN 7 11/01/2010   NA 139 11/01/2010   K 4.0 11/01/2010   CL 105 11/01/2010   CO2 25 10/24/2009   She follows a healthy diet and exercises regularly, she has lost about 15-16 pounds sine 12/2015.   Hx of OSA, she follows with pulmonologist.   Concerns today: Mammogram and labs requested.  FHx for DM II (parents and siblings),she denies Hx of DM or HLD.  FHx + for colon cancer,  father at 30 and paternal uncle at 28.  Denies abdominal pain, nausea, vomiting, changes in bowel habits, blood in stool or melena. + Smoker.   Review of Systems  Constitutional: Negative for activity change, appetite change, fatigue, fever and unexpected weight change.  HENT: Negative for mouth sores, nosebleeds and trouble swallowing.   Eyes: Negative for redness and visual disturbance.  Respiratory: Negative for cough, shortness of breath and wheezing.   Cardiovascular: Negative for chest pain, palpitations and leg swelling.  Gastrointestinal: Negative for abdominal pain, blood in stool, nausea and vomiting.       Negative for changes in bowel habits.  Endocrine: Negative for polydipsia, polyphagia and polyuria.  Genitourinary: Negative for decreased urine volume,  dysuria and hematuria.  Musculoskeletal: Positive for arthralgias. Negative for gait problem.  Neurological: Negative for syncope, weakness and headaches.  Psychiatric/Behavioral: Negative for confusion. The patient is not nervous/anxious.       No current outpatient prescriptions on file prior to visit.   No current facility-administered medications on file prior to visit.      Past Medical History:  Diagnosis Date  . Hypertension   . OSA (obstructive sleep apnea)    Allergies  Allergen Reactions  . Codeine     Unable to Urinate     Family History  Problem Relation Age of Onset  . Hyperlipidemia Mother   . Stroke Mother   . Heart disease Mother   . Hypertension Mother   . Diabetes Mother   . Cancer Father   . Hyperlipidemia Father   . Heart disease Father   . Stroke Father   . Hypertension Father   . Diabetes Father   . Diabetes Brother   . Cancer Brother     colon    Social History   Social History  . Marital status: Married    Spouse name: N/A  . Number of children: N/A  . Years of education: N/A   Social History Main Topics  . Smoking status: Current Some Day Smoker    Packs/day: 0.50  . Smokeless tobacco: Never Used  . Alcohol use No  . Drug use: No  . Sexual activity: Not Asked   Other Topics Concern  . None   Social History Narrative  . None  Vitals:   04/24/16 0847  BP: 128/90  Pulse: 92  Resp: 12  O2 sat 98% at RA.  Body mass index is 43.83 kg/m.   Wt Readings from Last 3 Encounters:  04/24/16 (!) 341 lb 6 oz (154.8 kg)  01/25/13 (!) 310 lb (140.6 kg)     Physical Exam  Nursing note and vitals reviewed. Constitutional: She is oriented to person, place, and time. She appears well-developed. No distress.  HENT:  Head: Atraumatic.  Mouth/Throat: Oropharynx is clear and moist and mucous membranes are normal.  Eyes: Conjunctivae and EOM are normal. Pupils are equal, round, and reactive to light.  Neck: No JVD present. No  tracheal deviation present. No thyroid mass and no thyromegaly present.  Cardiovascular: Normal rate and regular rhythm.   No murmur heard. Pulses:      Dorsalis pedis pulses are 2+ on the right side, and 2+ on the left side.  Respiratory: Effort normal and breath sounds normal. No respiratory distress.  GI: Soft. She exhibits no mass. There is no hepatomegaly. There is no tenderness.  Musculoskeletal: She exhibits no edema.  Lymphadenopathy:    She has no cervical adenopathy.  Neurological: She is alert and oriented to person, place, and time. She has normal strength. Coordination and gait normal.  Skin: Skin is warm. No erythema.  Psychiatric: She has a normal mood and affect.  Well groomed, good eye contact.      ASSESSMENT AND PLAN:    Camisha was seen today for establish care.  Diagnoses and all orders for this visit:  Hypertension, essential, benign  DBP slightly elevated. No changes in current management. DASH-low salt diet recommended. Amlodipine added,some side effects discussed.  Eye exam recommended annually. Monitor BP at home. F/U in 2-3 months, before if needed.  -     Lipid panel -     Basic metabolic panel -     Hemoglobin A1c -     amLODipine (NORVASC) 2.5 MG tablet; Take 1 tablet (2.5 mg total) by mouth daily. -     lisinopril-hydrochlorothiazide (PRINZIDE,ZESTORETIC) 10-12.5 MG tablet; Take 1 tablet by mouth daily.  BMI 40.0-44.9, adult ([place])  Reporting wt loss since she started a healthier lifestyle. We discussed benefits of wt loss as well as adverse effects of obesity. Consistency with healthy diet and physical activity recommended.   -     Lipid panel -     Basic metabolic panel -     Hemoglobin A1c -     Ambulatory referral to diabetic education  Breast cancer screening -     MM SCREENING BREAST TOMO BILATERAL; Future  Family history of colon cancer in father -     Ambulatory referral to Gastroenterology  Need for  diphtheria-tetanus-pertussis (Tdap) vaccine -     Tdap vaccine greater than or equal to 7yo IM  Uncontrolled type 2 diabetes mellitus with hyperglycemia, without long-term current use of insulin ([place])  Lab Results  Component Value Date   HGBA1C 8.4 (H) 04/24/2016   New Dx. Metfromin will be recommended. Foot exam to be done next OV.   -     Ambulatory referral to diabetic education        Abeer Deskins G. Martinique, MD  [facility]. [place] office.

## 2016-04-24 NOTE — Patient Instructions (Addendum)
A few things to remember from today's visit:   BMI 40.0-44.9, adult ([place]) - Plan: Lipid panel, Basic metabolic panel, Hemoglobin A1c  Hypertension, essential, benign - Plan: Lipid panel, Basic metabolic panel, Hemoglobin A1c, amLODipine (NORVASC) 2.5 MG tablet, lisinopril-hydrochlorothiazide (PRINZIDE,ZESTORETIC) 10-12.5 MG tablet  Breast cancer screening - Plan: MM SCREENING BREAST TOMO BILATERAL  Family history of colon cancer in father - Plan: Ambulatory referral to Gastroenterology  Blood pressure goal for most people is less than 140/90. Some populations (older than 60) the goal is less than 150/90.  Most recent cardiologists' recommendations recommend blood pressure at or less than 130/80.   Elevated blood pressure increases the risk of strokes, heart and kidney disease, and eye problems. Regular physical activity and a healthy diet (DASH diet) usually help. Low salt diet. Take medications as instructed.  Caution with some over the counter medications as cold medications, dietary products (for weight loss), and Ibuprofen or Aleve (frequent use);all these medications could cause elevation of blood pressure.  Please work on smoking cessation.  We have ordered labs or studies at this visit.  It can take up to 1-2 weeks for results and processing. IF results require follow up or explanation, we will call you with instructions. Clinically stable results will be released to your [facility]. If you have not heard from [place] or cannot find your results in [facility] in 2 weeks please contact our office at [phone].  If you are not yet signed up for [facility], please consider signing up  Please be sure medication list is accurate. If a new problem present, please set up appointment sooner than planned today.

## 2016-04-25 ENCOUNTER — Encounter: Payer: Self-pay | Admitting: Gastroenterology

## 2016-04-25 ENCOUNTER — Other Ambulatory Visit: Payer: Self-pay

## 2016-04-25 MED ORDER — METFORMIN HCL 500 MG PO TABS: 500.0000 mg | ORAL_TABLET | Freq: Two times a day (BID) | ORAL | 3 refills | Status: DC

## 2016-04-27 ENCOUNTER — Encounter: Payer: Self-pay | Admitting: Family Medicine

## 2016-04-28 DIAGNOSIS — E1169 Type 2 diabetes mellitus with other specified complication: Secondary | ICD-10-CM | POA: Insufficient documentation

## 2016-04-29 ENCOUNTER — Encounter: Payer: Self-pay | Admitting: Family Medicine

## 2016-05-03 ENCOUNTER — Ambulatory Visit (INDEPENDENT_AMBULATORY_CARE_PROVIDER_SITE_OTHER): Payer: BC Managed Care – PPO | Admitting: Gastroenterology

## 2016-05-03 ENCOUNTER — Encounter: Payer: Self-pay | Admitting: Gastroenterology

## 2016-05-03 VITALS — BP 110/78 | HR 104 | Ht 74.0 in | Wt 337.2 lb

## 2016-05-03 DIAGNOSIS — Z800 Family history of malignant neoplasm of digestive organs: Secondary | ICD-10-CM | POA: Insufficient documentation

## 2016-05-03 DIAGNOSIS — Z1211 Encounter for screening for malignant neoplasm of colon: Secondary | ICD-10-CM | POA: Diagnosis not present

## 2016-05-03 MED ORDER — NA SULFATE-K SULFATE-MG SULF 17.5-3.13-1.6 GM/177ML PO SOLN: 1.0000 | ORAL | 0 refills | Status: DC

## 2016-05-03 NOTE — Patient Instructions (Signed)

## 2016-05-03 NOTE — Progress Notes (Addendum)
     05/03/2016 CYNDRA FEINBERG [id number] 05-02-1975   HISTORY OF PRESENT ILLNESS:  This is a pleasant 41 year old female who is new to our practice.  She is here today to discuss colonoscopy.  She has family history of colon cancer in her father and a paternal uncle. Both diagnosed in their 44's and both died from the disease within a couple of years of each other.  The patient denies any GI complaints.   Past Medical History:  Diagnosis Date  . Diabetes mellitus without complication ([place]) 65/4650  . Hypertension   . OSA (obstructive sleep apnea)    Past Surgical History:  Procedure Laterality Date  . ABDOMINAL HYSTERECTOMY    . FOOT SURGERY     Left  . KNEE ARTHROSCOPY    . ROTATOR CUFF REPAIR Left     reports that she has been smoking.  She has been smoking about 0.50 packs per day. She has never used smokeless tobacco. She reports that she drinks alcohol. She reports that she does not use drugs. family history includes Colon cancer in her father and paternal uncle; Diabetes in her brother, father, and mother; Diabetes Mellitus II in her maternal grandmother; Heart disease in her father and mother; Hyperlipidemia in her father and mother; Hypertension in her father, maternal grandmother, and mother; Stroke in her father and mother. Allergies  Allergen Reactions  . Codeine     Unable to Urinate       Outpatient Encounter Prescriptions as of 05/03/2016  Medication Sig  . amLODipine (NORVASC) 2.5 MG tablet Take 1 tablet (2.5 mg total) by mouth daily.  Marland Kitchen lisinopril-hydrochlorothiazide (PRINZIDE,ZESTORETIC) 10-12.5 MG tablet Take 1 tablet by mouth daily.  . metFORMIN (GLUCOPHAGE) 500 MG tablet Take 1 tablet (500 mg total) by mouth 2 (two) times daily with a meal.  . ranitidine (ZANTAC) 150 MG tablet Take 150 mg by mouth as needed.    No facility-administered encounter medications on file as of 05/03/2016.      REVIEW OF SYSTEMS  : All other systems reviewed and negative except  where noted in the History of Present Illness.   PHYSICAL EXAM: BP 110/78   Pulse (!) 104   Ht 6\' 2"  (1.88 m)   Wt (!) 337 lb 4 oz (153 kg)   BMI 43.30 kg/m  General: Well developed black female in no acute distress Head: Normocephalic and atraumatic Eyes:  Sclerae anicteric, conjunctiva pink. Ears: Normal auditory acuity Lungs: Clear throughout to auscultation Heart: Regular rate and rhythm Abdomen: Soft, non-distended.  Normal bowel sounds.  Non-tender. Rectal:  Will be done at the time of colonoscopy. Musculoskeletal: Symmetrical with no gross deformities  Skin: No lesions on visible extremities Extremities: No edema  Neurological: Alert oriented x 4, grossly non-focal Psychological:  Alert and cooperative. Normal mood and affect  ASSESSMENT AND PLAN: -Family history of colon cancer in her father and a paternal uncle:  Both diagnosed in their 35's and both died from the disease within a couple of years of each other.  Will schedule patient for colonoscopy.  The risks, benefits, and alternatives to colonoscopy were discussed with the patient and she consents to proceed.   CC:  Martinique, Betty G, MD  Addendum: Reviewed and agree with initial management. Jerene Bears, MD

## 2016-05-04 NOTE — Telephone Encounter (Signed)
Letter completed. Patient advised. Faxed to [phone] per patient request.

## 2016-05-07 ENCOUNTER — Other Ambulatory Visit: Payer: Self-pay | Admitting: Family Medicine

## 2016-05-07 DIAGNOSIS — E1165 Type 2 diabetes mellitus with hyperglycemia: Secondary | ICD-10-CM

## 2016-05-07 MED ORDER — EMPAGLIFLOZIN 10 MG PO TABS: 10.0000 mg | ORAL_TABLET | Freq: Every day | ORAL | 4 refills | Status: DC

## 2016-05-10 ENCOUNTER — Encounter: Payer: Self-pay | Admitting: Internal Medicine

## 2016-05-21 ENCOUNTER — Encounter: Payer: BC Managed Care – PPO | Admitting: Internal Medicine

## 2016-05-21 ENCOUNTER — Telehealth: Payer: Self-pay | Admitting: Internal Medicine

## 2016-05-21 MED ORDER — NA SULFATE-K SULFATE-MG SULF 17.5-3.13-1.6 GM/177ML PO SOLN: 1.0000 | ORAL | 0 refills | Status: DC

## 2016-05-21 NOTE — Telephone Encounter (Signed)
No charge Okay to refill prep and please facilitate rescheduling

## 2016-05-21 NOTE — Telephone Encounter (Signed)
Rx resent to [facility]

## 2016-05-23 ENCOUNTER — Ambulatory Visit (INDEPENDENT_AMBULATORY_CARE_PROVIDER_SITE_OTHER): Payer: BC Managed Care – PPO | Admitting: Podiatry

## 2016-05-23 ENCOUNTER — Encounter: Payer: Self-pay | Admitting: Podiatry

## 2016-05-23 VITALS — BP 155/90 | HR 84

## 2016-05-23 DIAGNOSIS — B351 Tinea unguium: Secondary | ICD-10-CM

## 2016-05-23 DIAGNOSIS — M79676 Pain in unspecified toe(s): Secondary | ICD-10-CM | POA: Diagnosis not present

## 2016-05-23 DIAGNOSIS — L600 Ingrowing nail: Secondary | ICD-10-CM

## 2016-05-23 NOTE — Progress Notes (Signed)
   Subjective:    Patient ID: Monica [name], female    DOB: May 10, 1975, 41 y.o.   MRN: [id number]  HPI this patient presents the office with chief complaint of pain noted on the outside border the big toe of the right foot. She says that the nail grows into the side and she usually days out the painful corner  of the nail.  She denies any drainage or pus noted from this site. She says she has lived with her long thick nails for a long period of time. She presents the office today for an evaluation and definitive treatment for her painful nail    Review of Systems     Objective:   Physical Exam GENERAL APPEARANCE: Alert, conversant. Appropriately groomed. No acute distress.  VASCULAR: Pedal pulses are  palpable at  [facility] and PT bilateral.  Capillary refill time is immediate to all digits,  Normal temperature gradient.   NEUROLOGIC: sensation is normal to 5.07 monofilament at 5/5 sites bilateral.  Light touch is intact bilateral, Muscle strength normal.  MUSCULOSKELETAL: acceptable muscle strength, tone and stability bilateral.  Intrinsic muscluature intact bilateral.  Rectus appearance of foot and digits noted bilateral.   DERMATOLOGIC: skin color, texture, and turgor are within normal limits.  No preulcerative lesions or ulcers  are seen, no interdigital maceration noted.  No open lesions present.   No drainage noted.  NAILS  Thick disfigured hallux toenails bilaterally.  Marked incurvation noted along the lateral border of the right great toenail. No evidence of any redness, swelling or drainage only pain.          Assessment & Plan:  Onychomycosis  B/L  Ingrown Toenail right hallux toenail.  IE  To schedule this patient for stabilization of the lateral border of the right great toe at a later date.   Gardiner Barefoot DPM

## 2016-05-30 NOTE — Telephone Encounter (Signed)
Patient has rescheduled to 07/05/16 @ 2:30 pm. New instructions have been mailed to patient and she verbalizes understanding.

## 2016-05-31 ENCOUNTER — Encounter: Payer: Self-pay | Admitting: Podiatry

## 2016-05-31 ENCOUNTER — Ambulatory Visit (INDEPENDENT_AMBULATORY_CARE_PROVIDER_SITE_OTHER): Payer: BC Managed Care – PPO | Admitting: Podiatry

## 2016-05-31 DIAGNOSIS — L600 Ingrowing nail: Secondary | ICD-10-CM | POA: Diagnosis not present

## 2016-05-31 DIAGNOSIS — M79676 Pain in unspecified toe(s): Secondary | ICD-10-CM

## 2016-05-31 DIAGNOSIS — B351 Tinea unguium: Secondary | ICD-10-CM

## 2016-05-31 NOTE — Progress Notes (Signed)
   Subjective:    Patient ID: Monica [name], female    DOB: 1976/02/12, 41 y.o.   MRN: [id number]  HPI this patient presents the office with chief complaint of pain noted on the outside border the big toe of the right foot. She says that the nail grows into the side and she usually days out the painful corner  of the nail.  She was initially seen last week and scheduled for nail surgery today.  She presents for surgery.    Review of Systems     Objective:   Physical Exam GENERAL APPEARANCE: Alert, conversant. Appropriately groomed. No acute distress.  VASCULAR: Pedal pulses are  palpable at  [facility] and PT bilateral.  Capillary refill time is immediate to all digits,  Normal temperature gradient.   NEUROLOGIC: sensation is normal to 5.07 monofilament at 5/5 sites bilateral.  Light touch is intact bilateral, Muscle strength normal.  MUSCULOSKELETAL: acceptable muscle strength, tone and stability bilateral.  Intrinsic muscluature intact bilateral.  Rectus appearance of foot and digits noted bilateral.   DERMATOLOGIC: skin color, texture, and turgor are within normal limits.  No preulcerative lesions or ulcers  are seen, no interdigital maceration noted.  No open lesions present.   No drainage noted.  NAILS  Thick disfigured hallux toenails bilaterally.  Marked incurvation noted along the lateral border of the right great toenail. No evidence of any redness, swelling or drainage only pain.          Assessment & Plan:  Onychomycosis  B/L  Ingrown Toenail right hallux toenail.  Nail surgery. Treatment options and alternatives discussed.  Recommended permanent phenol matrixectomy and patient agreed.  Right hallux  was prepped with alcohol and a toe block of 3cc of 2% lidocaine plain was administered in a digital toe block. .  The toe was then prepped with betadine solution .  The offending nail border was then excised and matrix tissue exposed.  Phenol was then applied to the matrix tissue  followed by an alcohol wash.  Antibiotic ointment and a dry sterile dressing was applied.  The patient was dispensed instructions for aftercare. RTC 1 week.     Gardiner Barefoot DPM    Gardiner Barefoot DPM

## 2016-06-06 ENCOUNTER — Encounter: Payer: Self-pay | Admitting: Family Medicine

## 2016-06-06 ENCOUNTER — Encounter: Payer: BC Managed Care – PPO | Attending: Family Medicine | Admitting: Registered"

## 2016-06-06 DIAGNOSIS — Z6841 Body Mass Index (BMI) 40.0 and over, adult: Secondary | ICD-10-CM | POA: Diagnosis not present

## 2016-06-06 DIAGNOSIS — E1165 Type 2 diabetes mellitus with hyperglycemia: Secondary | ICD-10-CM | POA: Insufficient documentation

## 2016-06-06 DIAGNOSIS — Z713 Dietary counseling and surveillance: Secondary | ICD-10-CM | POA: Insufficient documentation

## 2016-06-06 DIAGNOSIS — E119 Type 2 diabetes mellitus without complications: Secondary | ICD-10-CM

## 2016-06-06 NOTE — Patient Instructions (Signed)
Plan:   Aim for 2-4 Carb Choices per meal (30-60 grams) +/- 1 either way   Aim for 0-1 Carbs per snack if hungry   Include protein in moderation with your meals and snacks  Consider including nuts in your diet on a regular basis  Consider reading food labels for Total Carbohydrate and sat Fat Grams of foods  Continue your physical activity as tolerated  Call insurance to see what meter is covered, then call Dr for prescription,  Check BG 2x day (not necessary every day); in the morning before eating, and once 2 hrs after a meal.  Consider checking BG at alternate times per day as directed by MD   Continue taking medication as directed by MD  website resources  Www.diabetes.org  Www.calorieking.com  https://www.healthydiningfinder.com/

## 2016-06-06 NOTE — Progress Notes (Signed)
Diabetes Self-Management Education  Visit Type: First/Initial  Appt. Start Time: 1005 Appt. End Time: 1120  06/06/2016  [name], identified by name and date of birth, is a 41 y.o. female with a diagnosis of Diabetes: Type 2.   ASSESSMENT Pt states she has a family history of diabetes and wants to prevent having complications. Pt reports making several lifestyle changes since diagnosis including increasing exercise, increased meal prepping and eating more structured meals. Pt states she wants to start checking her BG. Pt currently tracks her intake on my fitness pal. Pt states she has lost ~40 lbs since she has made lifestyle changes.  Height 6\' 2"  (1.88 m), weight (!) 328 lb 12.8 oz (149.1 kg). Body mass index is 42.22 kg/m.      Diabetes Self-Management Education - 06/06/16 1011      Visit Information   Visit Type First/Initial     Initial Visit   Diabetes Type Type 2   Are you currently following a meal plan? Yes   What type of meal plan do you follow? pescatarian - started when diagnosed   Are you taking your medications as prescribed? Yes   Date Diagnosed March 2018     Health Coping   How would you rate your overall health? Fair     Psychosocial Assessment   Patient Belief/Attitude about Diabetes Motivated to manage diabetes   How often do you need to have someone help you when you read instructions, pamphlets, or other written materials from your doctor or pharmacy? 1 - Never   What is the last grade level you completed in school? [employer]     Complications   Last HgB A1C per patient/outside source 8.4 %  March 2018   How often do you check your blood sugar? 0 times/day (not testing)   Number of hypoglycemic episodes per month 0  accidentally took double dose of jardiance   Have you had a dilated eye exam in the past 12 months? Yes   Have you had a dental exam in the past 12 months? Yes   Are you checking your feet? Yes   How many days per week are you  checking your feet? 7  checked by podiatristrist recently     Dietary Intake   Breakfast yogurt, banana OR overnight oats with fruit OR apple crisp with chia seed   Snack (morning) none   Lunch salmon salad, edamame, cheese, cranberries   Snack (afternoon) none   Dinner salmon, veggie   Snack (evening) apple OR cereal OR rice cakes   Beverage(s) water, (pH)     Exercise   Exercise Type Moderate (swimming / aerobic walking)  joined a gym   How many days per week to you exercise? 4   How many minutes per day do you exercise? 30   Total minutes per week of exercise 120     Patient Education   Previous Diabetes Education No   Disease state  Definition of diabetes, type 1 and 2, and the diagnosis of diabetes   Nutrition management  Role of diet in the treatment of diabetes and the relationship between the three main macronutrients and blood glucose level;Carbohydrate counting;Food label reading, portion sizes and measuring food.;Reviewed blood glucose goals for pre and post meals and how to evaluate the patients' food intake on their blood glucose level.;Effects of alcohol on blood glucose and safety factors with consumption of alcohol.;Information on hints to eating out and maintain blood glucose control.  Physical activity and exercise  Role of exercise on diabetes management, blood pressure control and cardiac health.   Monitoring Identified appropriate SMBG and/or A1C goals.;Purpose and frequency of SMBG.   Acute complications Taught treatment of hypoglycemia - the 15 rule.   Chronic complications Relationship between chronic complications and blood glucose control;Assessed and discussed foot care and prevention of foot problems;Retinopathy and reason for yearly dilated eye exams     Individualized Goals (developed by patient)   Nutrition Follow meal plan discussed     Outcomes   Expected Outcomes Demonstrated interest in learning. Expect positive outcomes   Future DMSE PRN    Program Status Completed      Individualized Plan for Diabetes Self-Management Training:   Learning Objective:  Patient will have a greater understanding of diabetes self-management. Patient education plan is to attend individual and/or group sessions per assessed needs and concerns.   Patient Instructions  Plan:   Aim for 2-4 Carb Choices per meal (30-60 grams) +/- 1 either way   Aim for 0-1 Carbs per snack if hungry   Include protein in moderation with your meals and snacks  Consider including nuts in your diet on a regular basis  Consider reading food labels for Total Carbohydrate and sat Fat Grams of foods  Continue your physical activity as tolerated  Call insurance to see what meter is covered, then call Dr for prescription,  Check BG 2x day (not necessary every day); in the morning before eating, and once 2 hrs after a meal.  Consider checking BG at alternate times per day as directed by MD   Continue taking medication as directed by MD  website resources  Www.diabetes.org  Www.calorieking.com  https://www.healthydiningfinder.com/   Expected Outcomes:  Demonstrated interest in learning. Expect positive outcomes  Education material provided: Living Well with Diabetes, A1C conversion sheet, My Plate and Carbohydrate counting sheet, Diabetes.org sign-up sheet  If problems or questions, patient to contact team via:  Phone and Email  Future DSME appointment: PRN

## 2016-06-07 ENCOUNTER — Other Ambulatory Visit: Payer: Self-pay

## 2016-06-07 ENCOUNTER — Ambulatory Visit (INDEPENDENT_AMBULATORY_CARE_PROVIDER_SITE_OTHER): Payer: BC Managed Care – PPO | Admitting: Podiatry

## 2016-06-07 DIAGNOSIS — Z09 Encounter for follow-up examination after completed treatment for conditions other than malignant neoplasm: Secondary | ICD-10-CM

## 2016-06-07 MED ORDER — ONETOUCH DELICA LANCETS 33G MISC: 1 refills | Status: DC

## 2016-06-07 MED ORDER — GLUCOSE BLOOD VI STRP: ORAL_STRIP | 1 refills | Status: DC

## 2016-06-07 MED ORDER — CEPHALEXIN 500 MG PO CAPS: 500.0000 mg | ORAL_CAPSULE | Freq: Two times a day (BID) | ORAL | 0 refills | Status: DC

## 2016-06-07 NOTE — Progress Notes (Signed)
This patient returns to the office following nail surgery one week ago.  The patient says toe has been soaked and bandaged as directed.  There has been improvement of the toe since the surgery has been performed. The patient presents for continued evaluation and treatment.  GENERAL APPEARANCE: Alert, conversant. Appropriately groomed. No acute distress.  VASCULAR: Pedal pulses palpable at  [facility] and PT bilateral.  Capillary refill time is immediate to all digits,  Normal temperature gradient.    NEUROLOGIC: sensation is normal to 5.07 monofilament at 5/5 sites bilateral.  Light touch is intact bilateral, Muscle strength normal.  MUSCULOSKELETAL: acceptable muscle strength, tone and stability bilateral.  Intrinsic muscluature intact bilateral.  Rectus appearance of foot and digits noted bilateral.   DERMATOLOGIC: skin color, texture, and turgor are within normal limits.  No preulcerative lesions or ulcers  are seen, no interdigital maceration noted.   NAILS  There is necrotic tissue along the nail groove  In the absence of redness swelling and pain. Redness and swelling noted along nail groove with drainage.  DX  S/p nail surgery  ROV  Home instructions were discussed.  Patient to call the office if there are any questions or concerns. Prescribed cephalexin 500 mg  # 15.   RTC prn  Gardiner Barefoot DPM

## 2016-06-07 NOTE — Addendum Note (Signed)
Addended byDeidre Ala, Annaelle Kasel L on: 06/07/2016 02:20 PM   Modules accepted: Orders

## 2016-06-09 ENCOUNTER — Emergency Department (HOSPITAL_COMMUNITY)
Admission: EM | Admit: 2016-06-09 | Discharge: 2016-06-09 | Disposition: A | Discharge status: Home / Self Care | Payer: BC Managed Care – PPO | Attending: Emergency Medicine | Admitting: Emergency Medicine

## 2016-06-09 ENCOUNTER — Encounter (HOSPITAL_COMMUNITY): Payer: Self-pay | Admitting: *Deleted

## 2016-06-09 ENCOUNTER — Emergency Department (HOSPITAL_COMMUNITY): Payer: BC Managed Care – PPO

## 2016-06-09 DIAGNOSIS — Z79899 Other long term (current) drug therapy: Secondary | ICD-10-CM | POA: Diagnosis not present

## 2016-06-09 DIAGNOSIS — W1812XA Fall from or off toilet with subsequent striking against object, initial encounter: Secondary | ICD-10-CM | POA: Diagnosis not present

## 2016-06-09 DIAGNOSIS — R0781 Pleurodynia: Secondary | ICD-10-CM | POA: Insufficient documentation

## 2016-06-09 DIAGNOSIS — M7918 Myalgia, other site: Secondary | ICD-10-CM

## 2016-06-09 DIAGNOSIS — Y92002 Bathroom of unspecified non-institutional (private) residence single-family (private) house as the place of occurrence of the external cause: Secondary | ICD-10-CM | POA: Insufficient documentation

## 2016-06-09 DIAGNOSIS — Y939 Activity, unspecified: Secondary | ICD-10-CM | POA: Insufficient documentation

## 2016-06-09 DIAGNOSIS — W19XXXA Unspecified fall, initial encounter: Secondary | ICD-10-CM

## 2016-06-09 DIAGNOSIS — M25562 Pain in left knee: Secondary | ICD-10-CM | POA: Diagnosis not present

## 2016-06-09 DIAGNOSIS — Y999 Unspecified external cause status: Secondary | ICD-10-CM | POA: Diagnosis not present

## 2016-06-09 DIAGNOSIS — E039 Hypothyroidism, unspecified: Secondary | ICD-10-CM | POA: Insufficient documentation

## 2016-06-09 DIAGNOSIS — I10 Essential (primary) hypertension: Secondary | ICD-10-CM | POA: Insufficient documentation

## 2016-06-09 DIAGNOSIS — Z7984 Long term (current) use of oral hypoglycemic drugs: Secondary | ICD-10-CM | POA: Diagnosis not present

## 2016-06-09 DIAGNOSIS — E119 Type 2 diabetes mellitus without complications: Secondary | ICD-10-CM | POA: Diagnosis not present

## 2016-06-09 DIAGNOSIS — S0990XA Unspecified injury of head, initial encounter: Secondary | ICD-10-CM | POA: Insufficient documentation

## 2016-06-09 DIAGNOSIS — F17200 Nicotine dependence, unspecified, uncomplicated: Secondary | ICD-10-CM | POA: Diagnosis not present

## 2016-06-09 MED ORDER — METHOCARBAMOL 500 MG PO TABS: 500.0000 mg | ORAL_TABLET | Freq: Two times a day (BID) | ORAL | 0 refills | Status: DC

## 2016-06-09 MED ORDER — IBUPROFEN 400 MG PO TABS
600.0000 mg | ORAL_TABLET | Freq: Once | ORAL | Status: AC
  Administered 2016-06-09: 16:00:00 600 mg via ORAL
  Filled 2016-06-09: qty 1

## 2016-06-09 MED ORDER — OXYCODONE-ACETAMINOPHEN 5-325 MG PO TABS
1.0000 | ORAL_TABLET | Freq: Once | ORAL | Status: AC
  Administered 2016-06-09: 1 via ORAL
  Filled 2016-06-09: qty 1

## 2016-06-09 MED ORDER — IBUPROFEN 600 MG PO TABS: 600.0000 mg | ORAL_TABLET | Freq: Four times a day (QID) | ORAL | 0 refills | Status: DC | PRN

## 2016-06-09 NOTE — ED Provider Notes (Signed)
[facility] DEPT Provider Note   CSN: [id number] Arrival date & time: 06/09/16  1233  By signing my name below, I, Monica [name], attest that this documentation has been prepared under the direction and in the presence of Monica Decamp, PA-C. Electronically Signed: Neta [name], ED Scribe. 06/09/2016. 4:05 PM.   History   Chief Complaint Chief Complaint  Patient presents with  . Head Injury  . Fall   The history is provided by the patient. No language interpreter was used.   HPI Comments: Monica [name] is a 41 y.o. female with PMHx of DM and HTN, who presents to the Emergency Department here due to a head injury that occurred at 83 this AM. Pt reports that she walked to the bathroom because she felt nauseous and then she fell over, hitting her head on the toilet.  She also hit her left knee on the floor and her left side on the wall during the fall. She did not vomit or lose consciousness. She complains of a headache and worsening pain and swelling to the left knee. She has taken tylenol with no relief. Denies blurred vision, numbness, tingling.   Past Medical History:  Diagnosis Date  . Diabetes mellitus without complication ([place]) 39/0300  . Hypertension   . OSA (obstructive sleep apnea)     Patient Active Problem List   Diagnosis Date Noted  . Special screening for malignant neoplasms, colon 05/03/2016  . Family history of colon cancer in father 05/03/2016  . Diabetes mellitus type II, uncontrolled (Monica [name]) 04/28/2016  . Hypertension, essential, benign 04/24/2016  . HYPOTHYROIDISM 06/28/2008  . BMI 40.0-44.9, adult ([place]) 06/28/2008  . CHEST PAIN 06/28/2008    Past Surgical History:  Procedure Laterality Date  . ABDOMINAL HYSTERECTOMY    . FOOT SURGERY     Left  . KNEE ARTHROSCOPY    . ROTATOR CUFF REPAIR Left     OB History    No data available     Home Medications    Prior to Admission medications   Medication Sig Start Date End Date Taking?  Authorizing Provider  amLODipine (NORVASC) 2.5 MG tablet Take 1 tablet (2.5 mg total) by mouth daily. 04/24/16   Betty G Martinique, MD  cephALEXin (KEFLEX) 500 MG capsule Take 1 capsule (500 mg total) by mouth 2 (two) times daily. 06/07/16   Gardiner Barefoot, DPM  Dextromethorphan-Guaifenesin (MUCINEX DM PO) Take by mouth.    Historical Provider, MD  empagliflozin (JARDIANCE) 10 MG TABS tablet Take 10 mg by mouth daily. In the morning 05/07/16   Betty G Martinique, MD  glucose blood test strip Use to test blood sugar once daily. One Touch Verio Test Strips. 06/07/16   Betty G Martinique, MD  lisinopril-hydrochlorothiazide (PRINZIDE,ZESTORETIC) 10-12.5 MG tablet Take 1 tablet by mouth daily. 04/24/16   Betty G Martinique, MD  metFORMIN (GLUCOPHAGE) 500 MG tablet Take 1 tablet (500 mg total) by mouth 2 (two) times daily with a meal. Patient not taking: Reported on 06/06/2016 04/25/16   Betty G Martinique, MD  Na Sulfate-K Sulfate-Mg Sulf 17.5-3.13-1.6 GM/180ML SOLN Take 1 kit by mouth as directed. Patient not taking: Reported on 05/23/2016 05/21/16   Jerene Bears, MD  [facility] DELICA LANCETS 92Z MISC Use to test blood sugar once daily. 06/07/16   Betty G Martinique, MD  ranitidine (ZANTAC) 150 MG tablet Take 150 mg by mouth as needed.     Historical Provider, MD    Family History Family History  Problem Relation  Age of Onset  . Hyperlipidemia Mother   . Stroke Mother   . Heart disease Mother   . Hypertension Mother   . Diabetes Mother   . Hyperlipidemia Father   . Heart disease Father   . Stroke Father   . Hypertension Father   . Diabetes Father   . Colon cancer Father   . Diabetes Brother   . Hypertension Maternal Grandmother   . Diabetes Mellitus II Maternal Grandmother   . Colon cancer Paternal Uncle     Social History Social History  Substance Use Topics  . Smoking status: Current Some Day Smoker    Packs/day: 0.50  . Smokeless tobacco: Never Used     Comment: down to 3 cigarettes per day  . Alcohol use Yes      Comment: socially   Allergies   Codeine  Review of Systems Review of Systems  Eyes: Negative for visual disturbance.  Gastrointestinal: Positive for nausea. Negative for vomiting.  Musculoskeletal: Positive for arthralgias and joint swelling.  Neurological: Positive for headaches. Negative for syncope and numbness.    Physical Exam Updated Vital Signs BP 115/69 (BP Location: Right Arm)   Pulse 86   Temp 98.7 F (37.1 C) (Oral)   Resp 18   SpO2 99%   Physical Exam  Constitutional: She is oriented to person, place, and time. Vital signs are normal. She appears well-developed and well-nourished.  HENT:  Head: Normocephalic and atraumatic.  Right Ear: Hearing normal.  Left Ear: Hearing normal.  Eyes: Conjunctivae and EOM are normal. Pupils are equal, round, and reactive to light.  Neck: Normal range of motion. Neck supple.  Cardiovascular: Normal rate, regular rhythm, normal heart sounds and intact distal pulses.   Pulmonary/Chest: Effort normal and breath sounds normal.  Abdominal: Soft. There is no tenderness.  Musculoskeletal: Normal range of motion.  Left Knee: Negative anterior/poster drawer bilaterally. Negative ballottement test. No varus or valgus laxity. No crepitus. No pain with flexion or extension. No TTP of knees or ankles.   Neurological: She is alert and oriented to person, place, and time. She has normal strength. No cranial nerve deficit or sensory deficit.  Cranial Nerves:  II: Pupils equal, round, reactive to light III,IV, VI: ptosis not present, extra-ocular motions intact bilaterally  V,VII: smile symmetric, facial light touch sensation equal VIII: hearing grossly normal bilaterally  IX,X: midline uvula rise  XI: bilateral shoulder shrug equal and strong XII: midline tongue extension  Skin: Skin is warm and dry.  Psychiatric: She has a normal mood and affect. Her speech is normal and behavior is normal. Thought content normal.  Nursing note and vitals  reviewed.  ED Treatments / Results  DIAGNOSTIC STUDIES:  Oxygen Saturation is 99% on RA, normal by my interpretation.    COORDINATION OF CARE:  4:03 PM Discussed treatment plan with pt at bedside and pt agreed to plan.  Labs (all labs ordered are listed, but only abnormal results are displayed) Labs Reviewed - No data to display  EKG  EKG Interpretation None       Radiology Dg Ribs Unilateral W/chest Left  Result Date: 06/09/2016 CLINICAL DATA:  Left upper and lower rib pain following a fall. EXAM: LEFT RIBS AND CHEST - 3+ VIEW COMPARISON:  01/25/2013. FINDINGS: Normal sized heart.  Clear lungs.  No fracture or pneumothorax seen. IMPRESSION: No acute abnormality. Electronically Signed   By: Claudie Revering M.D.   On: 06/09/2016 16:38   Dg Knee Complete 4 Views Left  Result Date: 06/09/2016 CLINICAL DATA:  Left knee pain following a fall. EXAM: LEFT KNEE - COMPLETE 4+ VIEW COMPARISON:  None. FINDINGS: Minimal medial spur formation and vacuum phenomenon. Moderate anterior patellar spur formation. Anterior tibial spine spur. No fracture, dislocation or effusion. IMPRESSION: No fracture.  Minimal medial compartment degenerative changes. Electronically Signed   By: Claudie Revering M.D.   On: 06/09/2016 16:39    Procedures Procedures (including critical care time)  Medications Ordered in ED Medications - No data to display  Initial Impression / Assessment and Plan / ED Course  I have reviewed the triage vital signs and the nursing notes.  Pertinent labs & imaging results that were available during my care of the patient were reviewed by me and considered in my medical decision making (see chart for details).  Final Clinical Impressions(s) / ED Diagnoses   {I have reviewed and evaluated the relevant imaging studies.  {I have reviewed the relevant previous healthcare records.  {I obtained HPI from historian.   ED Course:  Assessment: Pt with mechanical fall this AM. Noted head  trauma. No LOC. No N/V. No vision changes. CN evaluated and unremarkable. Noted left knee pain and left rib cage pain. Patient X-Ray negative for obvious fracture or dislocation.  Pt advised to follow up with PCP. Patient given knee brace while in ED, conservative therapy recommended and discussed. Patient will be discharged home & is agreeable with above plan. Returns precautions discussed. Pt appears safe for discharge.  Disposition/Plan:  DC Home Additional Verbal discharge instructions given and discussed with patient.  Pt Instructed to f/u with PCP in the next week for evaluation and treatment of symptoms. Return precautions given Pt acknowledges and agrees with plan  Supervising Physician Deno Etienne, DO  Final diagnoses:  Fall, initial encounter  Acute pain of left knee  Rib pain on left side  Musculoskeletal pain    New Prescriptions New Prescriptions   No medications on file   I personally performed the services described in this documentation, which was scribed in my presence. The recorded information has been reviewed and is accurate.    Monica Decamp, PA-C 06/09/16 [place], DO 06/10/16 [phone]

## 2016-06-09 NOTE — ED Notes (Signed)
Pt not tolerating XL knee sleeve. Ortho paged.

## 2016-06-09 NOTE — Progress Notes (Signed)
Orthopedic Tech Progress Note Patient Details:  Monica [name] 08-19-1975 [id number]  Ortho Devices Type of Ortho Device: Ace wrap, Knee Sleeve   [name] 06/09/2016, 5:39 PM

## 2016-06-09 NOTE — Discharge Instructions (Signed)
Please read and follow all provided instructions.  Your diagnoses today include:  1. Fall, initial encounter   2. Acute pain of left knee   3. Rib pain on left side   4. Musculoskeletal pain     Tests performed today include: Vital signs. See below for your results today.   Medications prescribed:  Take as prescribed   Home care instructions:  Follow any educational materials contained in this packet.  Follow-up instructions: Please follow-up with your primary care provider for further evaluation of symptoms and treatment   Return instructions:  Please return to the Emergency Department if you do not get better, if you get worse, or new symptoms OR  - Fever (temperature greater than 101.46F)  - Bleeding that does not stop with holding pressure to the area    -Severe pain (please note that you may be more sore the day after your accident)  - Chest Pain  - Difficulty breathing  - Severe nausea or vomiting  - Inability to tolerate food and liquids  - Passing out  - Skin becoming red around your wounds  - Change in mental status (confusion or lethargy)  - New numbness or weakness    Please return if you have any other emergent concerns.  Additional Information:  Your vital signs today were: BP 115/69 (BP Location: Right Arm)    Pulse 86    Temp 98.7 F (37.1 C) (Oral)    Resp 18    SpO2 99%  If your blood pressure (BP) was elevated above 135/85 this visit, please have this repeated by your doctor within one month. ---------------

## 2016-06-09 NOTE — ED Notes (Signed)
Applied XLarge knee sleeve, pt did not tolerate sleeve well. States sleeve was too tight, notified Jessica(RN). Ortho tech contacted.

## 2016-06-09 NOTE — ED Triage Notes (Signed)
Pt reports that she felt nauseated this am, went to bathroom and hit her head on toilet and injured left knee. Denies loc.

## 2016-06-18 ENCOUNTER — Ambulatory Visit
Admission: RE | Admit: 2016-06-18 | Discharge: 2016-06-18 | Disposition: A | Discharge status: Home / Self Care | Payer: BC Managed Care – PPO | Source: Ambulatory Visit | Attending: Family Medicine | Admitting: Family Medicine

## 2016-06-18 DIAGNOSIS — Z1239 Encounter for other screening for malignant neoplasm of breast: Secondary | ICD-10-CM

## 2016-06-20 ENCOUNTER — Telehealth: Payer: Self-pay | Admitting: *Deleted

## 2016-06-20 NOTE — Telephone Encounter (Signed)
Pt states she has completed the antibiotics, but it does not look any better. I encouraged pt to make an appt if the toe did not look any better. Pt agreed and I transferred to schedulers.

## 2016-06-27 ENCOUNTER — Ambulatory Visit (INDEPENDENT_AMBULATORY_CARE_PROVIDER_SITE_OTHER): Payer: Self-pay | Admitting: Podiatry

## 2016-06-27 DIAGNOSIS — L03031 Cellulitis of right toe: Secondary | ICD-10-CM

## 2016-06-27 DIAGNOSIS — Z09 Encounter for follow-up examination after completed treatment for conditions other than malignant neoplasm: Secondary | ICD-10-CM

## 2016-06-27 DIAGNOSIS — L02611 Cutaneous abscess of right foot: Secondary | ICD-10-CM

## 2016-06-27 MED ORDER — DOXYCYCLINE HYCLATE 100 MG PO TABS: 100.0000 mg | ORAL_TABLET | Freq: Two times a day (BID) | ORAL | 0 refills | Status: DC

## 2016-06-27 NOTE — Progress Notes (Signed)
This patient returns to the office following nail surgery  .  The patient says toe has been soaked and bandaged as directed.  She says there is continued drainage and pain at the surgical site.  Patient was prescribed cephalexin and soaks but pain and drainage persists.  The patient presents for continued evaluation and treatment. This patient is diabetic.  GENERAL APPEARANCE: Alert, conversant. Appropriately groomed. No acute distress.  VASCULAR: Pedal pulses palpable at  [facility] and PT bilateral.  Capillary refill time is immediate to all digits,  Normal temperature gradient.    NEUROLOGIC: sensation is normal to 5.07 monofilament at 5/5 sites bilateral.  Light touch is intact bilateral, Muscle strength normal.  MUSCULOSKELETAL: acceptable muscle strength, tone and stability bilateral.  Intrinsic muscluature intact bilateral.  Rectus appearance of foot and digits noted bilateral.   DERMATOLOGIC: skin color, texture, and turgor are within normal limits.  No preulcerative lesions or ulcers  are seen, no interdigital maceration noted.   NAILS  Healing of skin at surgical site.  There is continued pain and swelling and pus at the proximal nail fold.  DX  S/p nail surgery  ROV  Home instructions were discussed.  Patient to call the office if there are any questions or concerns. Prescribed doxycycline for infection.  RTC 10 days   RTC prn  Gardiner Barefoot DPM

## 2016-07-04 ENCOUNTER — Ambulatory Visit: Payer: Worker's Compensation | Admitting: Podiatry

## 2016-07-05 ENCOUNTER — Encounter: Payer: BC Managed Care – PPO | Admitting: Internal Medicine

## 2016-07-18 ENCOUNTER — Other Ambulatory Visit: Payer: Self-pay | Admitting: Family Medicine

## 2016-07-18 DIAGNOSIS — I10 Essential (primary) hypertension: Secondary | ICD-10-CM

## 2016-07-20 ENCOUNTER — Other Ambulatory Visit: Payer: BC Managed Care – PPO

## 2016-07-23 NOTE — Progress Notes (Deleted)
HPI:   Monica [name] is a 41 y.o. female, who is here today for her routine physical.  Last seen on 04/24/16.   Regular exercise 3 or more time per week: *** Following a healthy diet: *** She lives with ***  Chronic medical problems: HTN,OA, OSA,and obesity among some. Also recently Dx with DM II.  Lab Results  Component Value Date   HGBA1C 8.4 (H) 04/24/2016   S/P hysterectomy. Pap smear **** Hx of abnormal pap smears: *** Hx of STD's ***  Mammogram: 06/18/16 [place] 1  She has *** concerns today.    Review of Systems    Current Outpatient Prescriptions on File Prior to Visit  Medication Sig Dispense Refill  . amLODipine (NORVASC) 2.5 MG tablet TAKE 1 TABLET(2.5 MG) BY MOUTH DAILY 90 tablet 1  . cephALEXin (KEFLEX) 500 MG capsule Take 1 capsule (500 mg total) by mouth 2 (two) times daily. 15 capsule 0  . Dextromethorphan-Guaifenesin (MUCINEX DM PO) Take by mouth.    . doxycycline (VIBRA-TABS) 100 MG tablet Take 1 tablet (100 mg total) by mouth 2 (two) times daily. 20 tablet 0  . empagliflozin (JARDIANCE) 10 MG TABS tablet Take 10 mg by mouth daily. In the morning 30 tablet 4  . glucose blood test strip Use to test blood sugar once daily. One Touch Verio Test Strips. 100 each 1  . ibuprofen (ADVIL,MOTRIN) 600 MG tablet Take 1 tablet (600 mg total) by mouth every 6 (six) hours as needed. 30 tablet 0  . lisinopril-hydrochlorothiazide (PRINZIDE,ZESTORETIC) 10-12.5 MG tablet Take 1 tablet by mouth daily. 90 tablet 1  . metFORMIN (GLUCOPHAGE) 500 MG tablet Take 1 tablet (500 mg total) by mouth 2 (two) times daily with a meal. (Patient not taking: Reported on 06/06/2016) 60 tablet 3  . methocarbamol (ROBAXIN) 500 MG tablet Take 1 tablet (500 mg total) by mouth 2 (two) times daily. 20 tablet 0  . Na Sulfate-K Sulfate-Mg Sulf 17.5-3.13-1.6 GM/180ML SOLN Take 1 kit by mouth as directed. (Patient not taking: Reported on 05/23/2016) 354 mL 0  . ONETOUCH DELICA LANCETS 22L  MISC Use to test blood sugar once daily. 100 each 1  . ranitidine (ZANTAC) 150 MG tablet Take 150 mg by mouth as needed.      No current facility-administered medications on file prior to visit.      Past Medical History:  Diagnosis Date  . Diabetes mellitus without complication ([place]) 79/8921  . Hypertension   . OSA (obstructive sleep apnea)     Allergies  Allergen Reactions  . Codeine     Unable to Urinate     Family History  Problem Relation Age of Onset  . Hyperlipidemia Mother   . Stroke Mother   . Heart disease Mother   . Hypertension Mother   . Diabetes Mother   . Hyperlipidemia Father   . Heart disease Father   . Stroke Father   . Hypertension Father   . Diabetes Father   . Colon cancer Father   . Diabetes Brother   . Hypertension Maternal Grandmother   . Diabetes Mellitus II Maternal Grandmother   . Colon cancer Paternal Uncle     Social History   Social History  . Marital status: Married    Spouse name: N/A  . Number of children: 1  . Years of education: N/A   Occupational History  . Technology Support Analyst    Social History Main Topics  . Smoking status: Current Some  Day Smoker    Packs/day: 0.50  . Smokeless tobacco: Never Used     Comment: down to 3 cigarettes per day  . Alcohol use Yes     Comment: socially  . Drug use: No  . Sexual activity: Not on file   Other Topics Concern  . Not on file   Social History Narrative  . No narrative on file     There were no vitals filed for this visit. There is no height or weight on file to calculate BMI.  _0 (3)@  Wt Readings from Last 3 Encounters:  06/06/16 (!) 328 lb 12.8 oz (149.1 kg)  05/03/16 (!) 337 lb 4 oz (153 kg)  04/24/16 (!) 341 lb 6 oz (154.8 kg)          Physical Exam    ASSESSMENT AND PLAN:      There are no diagnoses linked to this encounter.       HgA1C , BMP,fructosamine,microal/cr    No Follow-up on file.          Americus Scheurich G.  Martinique, MD  [facility]. [place] office.

## 2016-07-24 ENCOUNTER — Encounter: Payer: BC Managed Care – PPO | Admitting: Family Medicine

## 2016-07-24 DIAGNOSIS — Z0289 Encounter for other administrative examinations: Secondary | ICD-10-CM

## 2016-08-12 NOTE — Progress Notes (Signed)
HPI:   Monica [name] is a 41 y.o. female, who is here today for her routine physical.  Last seen on 04/24/16.  Regular exercise 3 or more time per week: Yes, cross fit gym at least 4 times per week. Following a healthy diet: Yes. She lives with wife and 2 children.  Chronic medical problems: HTN,OA, OSA,and obesity among some.  Also recently Dx with DM II.  Lab Results  Component Value Date   HGBA1C 8.4 (H) 04/24/2016   FG: 130's, post prandial 150's. She discontinued Metformin because GI side effects. She is on Jardiance 10 mg daily.  Hx of hypothyroidism, medication was discontinued a few years ago.   HTN:  Home BP's: 118-121/90-low 100's. Denies headache, visual changes, chest pain, dyspnea, palpitation, claudication, focal weakness, or edema. Currently she is on Lisinopril-HCTZ 10-12.5 mg and Amlodipine 2.5 mg daily.   S/P hysterectomy because heavy menses and anemia. Hx of abnormal pap smears: Denies. Hx of STD's: Denies.  Last eye exam 2 months ago.  Mammogram: 06/18/16 [facility] 1  Colonoscopy was recommended because positive FHX, she cancelled appt and has not re-scheduled it. + Smoker.   Review of Systems  Constitutional: Negative for appetite change, fatigue and fever.  HENT: Positive for ear pain (Left ear, 2 weeks ago.). Negative for dental problem, ear discharge, hearing loss, mouth sores, sinus pain, sore throat, trouble swallowing and voice change.   Eyes: Negative for pain and visual disturbance.  Respiratory: Negative for cough, shortness of breath and wheezing.   Cardiovascular: Negative for chest pain and leg swelling.  Gastrointestinal: Negative for abdominal pain, nausea and vomiting.       No changes in bowel habits.  Endocrine: Negative for cold intolerance, heat intolerance, polydipsia, polyphagia and polyuria.  Genitourinary: Negative for decreased urine volume, dysuria, hematuria, vaginal bleeding and vaginal discharge.    Musculoskeletal: Positive for arthralgias and back pain. Negative for neck pain.       Hx of OA and back pain.  Skin: Negative for color change and rash.  Neurological: Negative for syncope, weakness, numbness and headaches.  Hematological: Negative for adenopathy. Does not bruise/bleed easily.  Psychiatric/Behavioral: Negative for confusion and sleep disturbance. The patient is not nervous/anxious.   All other systems reviewed and are negative.     Current Outpatient Prescriptions on File Prior to Visit  Medication Sig Dispense Refill  . amLODipine (NORVASC) 2.5 MG tablet TAKE 1 TABLET(2.5 MG) BY MOUTH DAILY 90 tablet 1  . empagliflozin (JARDIANCE) 10 MG TABS tablet Take 10 mg by mouth daily. In the morning 30 tablet 4  . glucose blood test strip Use to test blood sugar once daily. One Touch Verio Test Strips. 100 each 1  . Na Sulfate-K Sulfate-Mg Sulf 17.5-3.13-1.6 GM/180ML SOLN Take 1 kit by mouth as directed. 354 mL 0  . ONETOUCH DELICA LANCETS 28N MISC Use to test blood sugar once daily. 100 each 1  . ranitidine (ZANTAC) 150 MG tablet Take 150 mg by mouth as needed.      No current facility-administered medications on file prior to visit.      Past Medical History:  Diagnosis Date  . Diabetes mellitus without complication ([place]) 86/7672  . Hypertension   . OSA (obstructive sleep apnea)     Allergies  Allergen Reactions  . Codeine     Unable to Urinate     Family History  Problem Relation Age of Onset  . Hyperlipidemia Mother   . Stroke  Mother   . Heart disease Mother   . Hypertension Mother   . Diabetes Mother   . Hyperlipidemia Father   . Heart disease Father   . Stroke Father   . Hypertension Father   . Diabetes Father   . Colon cancer Father   . Diabetes Brother   . Hypertension Maternal Grandmother   . Diabetes Mellitus II Maternal Grandmother   . Colon cancer Paternal Uncle     Social History   Social History  . Marital status: Married    Spouse  name: N/A  . Number of children: 1  . Years of education: N/A   Occupational History  . Technology Support Analyst    Social History Main Topics  . Smoking status: Current Some Day Smoker    Packs/day: 0.50  . Smokeless tobacco: Never Used     Comment: down to 3 cigarettes per day  . Alcohol use Yes     Comment: socially  . Drug use: No  . Sexual activity: Not Asked   Other Topics Concern  . None   Social History Narrative  . None     Vitals:   08/13/16 0649 08/13/16 0730  BP: 130/84 125/90  Pulse: 100   Resp: 12    Body mass index is 41.78 kg/m.  O2 sat at RA 96%  Wt Readings from Last 3 Encounters:  08/13/16 (!) 325 lb 6 oz (147.6 kg)  06/06/16 (!) 328 lb 12.8 oz (149.1 kg)  05/03/16 (!) 337 lb 4 oz (153 kg)     Physical Exam  Nursing note and vitals reviewed. Constitutional: She is oriented to person, place, and time. She appears well-developed. No distress.  HENT:  Head: Atraumatic.  Right Ear: Hearing, tympanic membrane, external ear and ear canal normal.  Left Ear: Hearing, tympanic membrane, external ear and ear canal normal.  Mouth/Throat: Uvula is midline, oropharynx is clear and moist and mucous membranes are normal.  Eyes: Conjunctivae and EOM are normal. Pupils are equal, round, and reactive to light.  Neck: No tracheal deviation present. No thyromegaly present.  Cardiovascular: Normal rate and regular rhythm.   No murmur heard. Pulses:      Dorsalis pedis pulses are 2+ on the right side, and 2+ on the left side.  Respiratory: Effort normal and breath sounds normal. No respiratory distress.  GI: Soft. She exhibits no mass. There is no hepatomegaly. There is no tenderness.  Genitourinary: No breast swelling or tenderness.  Genitourinary Comments: Breast: No masses,skin changes,or nipple discharge bilateral. Inverted nipples bilateral.  Musculoskeletal: She exhibits edema (Trace pitting LE edema.Bilateral).  No major deformity or signs of  synovitis appreciated.  Lymphadenopathy:    She has no cervical adenopathy.    She has no axillary adenopathy.       Right: No supraclavicular adenopathy present.       Left: No supraclavicular adenopathy present.  Neurological: She is alert and oriented to person, place, and time. She has normal strength. No cranial nerve deficit. Coordination and gait normal.  Reflex Scores:      Bicep reflexes are 2+ on the right side and 2+ on the left side.      Patellar reflexes are 2+ on the right side and 2+ on the left side. Skin: Skin is warm. No rash noted. No erythema.  Psychiatric: She has a normal mood and affect. Her speech is normal.  Well groomed, good eye contact.    Diabetic foot exam:  Monofilament normal bilateral. Peripheral  pulses present (DP). No calluses + Hypertrophic/long toenails.  ASSESSMENT AND PLAN:   Shiza was seen today for annual exam.  Diagnoses and all orders for this visit:  Lab Results  Component Value Date   HGBA1C 7.6 (H) 08/13/2016   Lab Results  Component Value Date   CREATININE 0.83 08/13/2016   BUN 11 08/13/2016   NA 136 08/13/2016   K 4.6 08/13/2016   CL 101 08/13/2016   CO2 26 08/13/2016   Lab Results  Component Value Date   CHOL 183 08/13/2016   HDL 54.60 08/13/2016   LDLCALC 112 (H) 08/13/2016   TRIG 79.0 08/13/2016   CHOLHDL 3 08/13/2016    Routine general medical examination at a health care facility  We discussed the importance of regular physical activity and healthy diet for prevention of chronic illness and/or complications. Preventive guidelines reviewed. Vaccination updated. Smoking cessation strongly recommended. Ca++ and vit D supplementation recommended. She needs to reschedule colonoscopy. Next CPE in 1-2 years.  Uncontrolled type 2 diabetes mellitus with hyperglycemia, without long-term current use of insulin ([place])  HgA1C pending. No changes in current management for now, will consider increasing  Jardiance. Regular exercise and healthy diet with avoidance of added sugar food intake is an important part of treatment and recommended to continue. Annual eye exam, periodic dental and foot care recommended. F/U in 5-6 months  -     Lipid panel -     Hemoglobin A1c -     Basic metabolic panel -     Fructosamine -     Microalbumin / creatinine urine ratio  Hypertension, essential, benign  Mildly elevated and also reported DBP > 90 at home. Increase Lisinopril dose from 10 to 20 mg daily. HCTZ and Amlodipine unchanged. Possible complications of elevated BP discussed. Annual eye examination. F/U in 6 weeks (nurse visit) and I will see her back in 5 months.  -     Basic metabolic panel -     lisinopril-hydrochlorothiazide (ZESTORETIC) 20-12.5 MG tablet; Take 1 tablet by mouth daily.  Hypothyroidism, unspecified type  Further recommendations will be given according to lab results.  -     TSH  Need for 23-polyvalent pneumococcal polysaccharide vaccine -     Pneumococcal polysaccharide vaccine 23-valent greater than or equal to 2yo subcutaneous/IM    Return in 5 months (on 01/13/2017) for Nurse visit BP check in 6 weeks. .          Loyola Santino G. Martinique, MD  [facility]. [place] office.

## 2016-08-13 ENCOUNTER — Ambulatory Visit (INDEPENDENT_AMBULATORY_CARE_PROVIDER_SITE_OTHER): Payer: BC Managed Care – PPO | Admitting: Family Medicine

## 2016-08-13 ENCOUNTER — Encounter: Payer: Self-pay | Admitting: Family Medicine

## 2016-08-13 VITALS — BP 125/90 | HR 100 | Resp 12 | Ht 74.0 in | Wt 325.4 lb

## 2016-08-13 DIAGNOSIS — Z0000 Encounter for general adult medical examination without abnormal findings: Secondary | ICD-10-CM | POA: Diagnosis not present

## 2016-08-13 DIAGNOSIS — E1165 Type 2 diabetes mellitus with hyperglycemia: Secondary | ICD-10-CM | POA: Diagnosis not present

## 2016-08-13 DIAGNOSIS — Z23 Encounter for immunization: Secondary | ICD-10-CM

## 2016-08-13 DIAGNOSIS — E039 Hypothyroidism, unspecified: Secondary | ICD-10-CM

## 2016-08-13 DIAGNOSIS — I10 Essential (primary) hypertension: Secondary | ICD-10-CM | POA: Diagnosis not present

## 2016-08-13 LAB — BASIC METABOLIC PANEL
BUN: 11 mg/dL (ref 6–23)
CO2: 26 meq/L (ref 19–32)
Calcium: 9.4 mg/dL (ref 8.4–10.5)
Chloride: 101 mEq/L (ref 96–112)
Creatinine, Ser: 0.83 mg/dL (ref 0.40–1.20)
GFR: 96.33 mL/min (ref >60.00)
GLUCOSE: 157 mg/dL — AB (ref 70–99)
POTASSIUM: 4.6 meq/L (ref 3.5–5.1)
SODIUM: 136 meq/L (ref 135–145)

## 2016-08-13 LAB — LIPID PANEL
CHOL/HDL RATIO: 3
CHOLESTEROL: 183 mg/dL (ref 0–200)
HDL: 54.6 mg/dL (ref >39.00)
LDL Cholesterol: 112 mg/dL — ABNORMAL HIGH (ref 0–99)
NonHDL: 127.96
TRIGLYCERIDES: 79 mg/dL (ref 0.0–149.0)
VLDL: 15.8 mg/dL (ref 0.0–40.0)

## 2016-08-13 LAB — HEMOGLOBIN A1C: Hgb A1c MFr Bld: 7.6 % — ABNORMAL HIGH (ref 4.6–6.5)

## 2016-08-13 LAB — MICROALBUMIN / CREATININE URINE RATIO
CREATININE, U: 159.5 mg/dL
MICROALB/CREAT RATIO: 0.6 mg/g (ref 0.0–30.0)
Microalb, Ur: 1 mg/dL (ref 0.0–1.9)

## 2016-08-13 LAB — TSH: TSH: 2.87 u[IU]/mL (ref 0.35–4.50)

## 2016-08-13 MED ORDER — LISINOPRIL-HYDROCHLOROTHIAZIDE 20-12.5 MG PO TABS: 1.0000 | ORAL_TABLET | Freq: Every day | ORAL | 2 refills | Status: DC

## 2016-08-13 NOTE — Patient Instructions (Signed)
A few things to remember from today's visit:   Routine general medical examination at a health care facility  Uncontrolled type 2 diabetes mellitus with hyperglycemia, without long-term current use of insulin ([place]) - Plan: Lipid panel, Hemoglobin Q7M, Basic metabolic panel, Fructosamine, Microalbumin / creatinine urine ratio  Hypertension, essential, benign - Plan: lisinopril-hydrochlorothiazide (ZESTORETIC) 20-12.5 MG tablet  Hypothyroidism, unspecified type - Plan: TSH    At least 150 minutes of moderate exercise per week, daily brisk walking for 15-30 min is a good exercise option. Healthy diet low in saturated (animal) fats and sweets and consisting of fresh fruits and vegetables, lean meats such as fish and white chicken and whole grains.   - Vaccines:  Tdap vaccine every 10 years.  Shingles vaccine recommended at age 49, could be given after 43 years of age but not sure about insurance coverage.  Pneumonia vaccines:  Prevnar 13 at 65 and Pneumovax at 79.  Screening recommendations for low/normal risk women:  Screening for diabetes at age 60-45 and every 3 years.  Cervical cancer prevention:  -HPV vaccination between 59-22 years old. -Pap smear starts at 43 years of age and continues periodically until 43 years old in low risk women. Pap smear every 3 years between 55 and 21 years old. Pap smear every 3 years between women 40 and older if pap smear negative and HPV screening negative.   -Breast cancer: Mammogram: There is disagreement between experts about when to start screening in low risk asymptomatic female but recent recommendations are to start screening at 65 and not later than 44 years old , every 1-2 years and after 43 yo q 2 years. Screening is recommended until 43 years old but some women can continue screening depending of healthy issues.   Colon cancer screening: starts at 43 years old until 43 years old.  Cholesterol disorder screening at age 24 and every 3  years.  Also recommended:  1. Dental visit- Brush and floss your teeth twice daily; visit your dentist twice a year. 2. Eye doctor- Get an eye exam at least every 2 years. 3. Helmet use- Always wear a helmet when riding a bicycle, motorcycle, rollerblading or skateboarding. 4. Safe sex- If you may be exposed to sexually transmitted infections, use a condom. 5. Seat belts- Seat belts can save your live; always wear one. 6. Smoke/Carbon Monoxide detectors- These detectors need to be installed on the appropriate level of your home. Replace batteries at least once a year. 7. Skin cancer- When out in the sun please cover up and use sunscreen 15 SPF or higher. 8. Violence- If anyone is threatening or hurting you, please tell your healthcare provider.  9. Drink alcohol in moderation- Limit alcohol intake to one drink or less per day. Never drink and drive.  Please be sure medication list is accurate. If a new problem present, please set up appointment sooner than planned today.

## 2016-08-15 LAB — FRUCTOSAMINE: Fructosamine: 254 umol/L (ref 190–270)

## 2016-08-25 ENCOUNTER — Other Ambulatory Visit: Payer: Self-pay | Admitting: Family Medicine

## 2016-08-25 ENCOUNTER — Encounter: Payer: Self-pay | Admitting: Family Medicine

## 2016-08-25 DIAGNOSIS — E1165 Type 2 diabetes mellitus with hyperglycemia: Secondary | ICD-10-CM

## 2016-08-25 MED ORDER — EMPAGLIFLOZIN 25 MG PO TABS: 25.0000 mg | ORAL_TABLET | Freq: Every day | ORAL | 2 refills | Status: DC

## 2016-09-27 ENCOUNTER — Ambulatory Visit: Payer: BC Managed Care – PPO

## 2016-09-27 ENCOUNTER — Telehealth: Payer: Self-pay

## 2016-09-27 VITALS — BP 110/96

## 2016-09-27 DIAGNOSIS — I10 Essential (primary) hypertension: Secondary | ICD-10-CM

## 2016-09-27 NOTE — Telephone Encounter (Signed)
Patient visited to have her BP check this morning, BP recorded was  110/96 on the right Arm with a large cuff, and a recheck on the left Arm was 118/92 with a large cuff.

## 2016-09-27 NOTE — Progress Notes (Signed)
Patient had a visit for BP check, Redings sent to Dr Martinique for Advise.

## 2016-10-01 NOTE — Telephone Encounter (Signed)
Spoke with pt voiced understanding that doctor Martinique is recommending for her to continue monitoring her BP at home and to keep her f/u appointment scheduled for November 2018.

## 2016-10-01 NOTE — Telephone Encounter (Signed)
For now she can continue monitoring at home, no changes. Keep f/u appt. Thanks, BJ

## 2016-10-04 ENCOUNTER — Encounter: Payer: Self-pay | Admitting: Family Medicine

## 2016-10-15 ENCOUNTER — Ambulatory Visit (INDEPENDENT_AMBULATORY_CARE_PROVIDER_SITE_OTHER): Payer: BC Managed Care – PPO

## 2016-10-15 ENCOUNTER — Telehealth: Payer: Self-pay | Admitting: Family Medicine

## 2016-10-15 DIAGNOSIS — Z111 Encounter for screening for respiratory tuberculosis: Secondary | ICD-10-CM | POA: Diagnosis not present

## 2016-10-15 NOTE — Progress Notes (Signed)
PPD Placement note Monica [name], 41 y.o. female is here today for placement of PPD test Reason for PPD test: employment Pt taken PPD test before: yes Verified in allergy area and with patient that they are not allergic to the products PPD is made of (Phenol or Tween). Yes Is patient taking any oral or IV steroid medication now or have they taken it in the last month? no Has the patient ever received the BCG vaccine?: no Has the patient been in recent contact with anyone known or suspected of having active TB disease?: no      Date of exposure (if applicable): n/a      Name of person they were exposed to (if applicable): n/a Patient's Country of origin?: [place] O: Alert and oriented in NAD. P:  PPD placed on 10/15/2016.  Patient advised to return for reading within 48-72 hours.

## 2016-10-15 NOTE — Telephone Encounter (Signed)
Form on MD's desk.

## 2016-10-15 NOTE — Telephone Encounter (Signed)
Patient dropped off Health Assessment /Medical Report Call patient to pick up at: [phone]  Would like to pick up when she comes back in for her TB test reading on Wednesday.  Disposition: Dr's Folder

## 2016-10-16 NOTE — Telephone Encounter (Signed)
Form is completed for patient to pick up tomorrow when she comes back in for her TB test to be read.

## 2016-10-17 NOTE — Progress Notes (Signed)
PPD Reading Note  PPD read and results entered in EpicCare.  Result: 0  mm induration.  Interpretation:Negative   If test not read within 48-72 hours of initial placement, patient advised to repeat in other arm 1-3 weeks after this test.  Allergic reaction:No

## 2016-11-08 ENCOUNTER — Encounter: Payer: Self-pay | Admitting: Family Medicine

## 2016-12-12 ENCOUNTER — Encounter: Payer: Self-pay | Admitting: Family Medicine

## 2016-12-12 ENCOUNTER — Ambulatory Visit (INDEPENDENT_AMBULATORY_CARE_PROVIDER_SITE_OTHER): Payer: BC Managed Care – PPO | Admitting: Family Medicine

## 2016-12-12 VITALS — BP 118/78 | HR 96 | Temp 98.1°F | Resp 12 | Ht 74.0 in | Wt 330.0 lb

## 2016-12-12 DIAGNOSIS — J309 Allergic rhinitis, unspecified: Secondary | ICD-10-CM | POA: Diagnosis not present

## 2016-12-12 DIAGNOSIS — R0981 Nasal congestion: Secondary | ICD-10-CM | POA: Diagnosis not present

## 2016-12-12 DIAGNOSIS — H9203 Otalgia, bilateral: Secondary | ICD-10-CM

## 2016-12-12 DIAGNOSIS — Z23 Encounter for immunization: Secondary | ICD-10-CM

## 2016-12-12 MED ORDER — FLUTICASONE PROPIONATE 50 MCG/ACT NA SUSP: 1.0000 | Freq: Two times a day (BID) | NASAL | 3 refills | Status: DC

## 2016-12-12 MED ORDER — AMOXICILLIN-POT CLAVULANATE 875-125 MG PO TABS: 1.0000 | ORAL_TABLET | Freq: Two times a day (BID) | ORAL | 0 refills | Status: AC

## 2016-12-12 MED ORDER — PREDNISONE 20 MG PO TABS: 40.0000 mg | ORAL_TABLET | Freq: Every day | ORAL | 0 refills | Status: AC

## 2016-12-12 NOTE — Patient Instructions (Signed)
A few things to remember from today's visit:   Nasal sinus congestion - Plan: predniSONE (DELTASONE) 20 MG tablet, fluticasone (FLONASE) 50 MCG/ACT nasal spray  Allergic rhinitis, unspecified seasonality, unspecified trigger  There are 2 forms of allergic rhinitis: . Seasonal (hay fever): Caused by an allergy to pollen and/or mold spores in the air. Pollen is the fine powder that comes from the stamen of flowering plants. It can be carried through the air and is easily inhaled. Symptoms are seasonal and usually occur in spring, late summer, and fall. Mar[place] Kitchen Perennial: Caused by other allergens such as dust mites, pet hair or dander, or mold. Symptoms occur year-round.  Symptoms: Your symptoms can vary, depending on the severity of your allergies. Symptoms can include: Sneezing, coughing.itching (mostly eyes, nose, mouth, throat and skin),runny nose,stuffy nose.headache,pressure in the nose and cheeks,ear fullness and popping, sore throat.watery, red, or swollen eyes,dark circles under your eyes,trouble smelling, and sometimes hives.  Allergic rhinitis cannot be prevented. You can help your symptoms by avoiding the things that you are allergic, including: . Keeping windows closed. This is especially important during high-pollen seasons. . Washing your hands after petting animals. . Using dust- and mite-proof bedding and mattress covers. . Wearing glasses outside to protect your eyes. . Showering before bed to wash off allergens from hair and skin. You can also avoid things that can make your symptoms worse, such as: . aerosol sprays . air pollution . cold temperatures . humidity . irritating fumes . tobacco smoke . wind . wood smoke.   Antihistamines help reduce the sneezing, runny nose, and itchiness of allergies. These come in pill form and as nasal sprays. Allegra,Zyrtec,or Claritin are some examples. Decongestants, such as pseudoephedrine and phenylephrine, help temporarily relieve the  stuffy nose of allergies. Decongestants are found in many medicines and come as pills, nose sprays, and nose drops. They could increase heart rate and cause tachycardia and tremor. Nasal Afrin should not be used for more than 3 days because you can become dependent on them. This causes you to feel even more stopped-up when you try to quit using them.  Nasal sprays: steroids or antihistaminics. Over the counter intranasal sterids: Nasocort,Rhinocort,or Flonase.You won't notice their benefits for up to 2 weeks after starting them. Allergy shots or sublingual tablets when other treatment do not help.This is done by immunologists.     Antibiotics also have side effects that go from gastrointestinal symptoms (diarrhea,nausea,bloating sensation) to bacterial resistance , and even serious illness as C. Diff  Infections.  If you are not feeling ANY better in 3-4 days you can start antibiotic treatment.   Please be sure medication list is accurate. If a new problem present, please set up appointment sooner than planned today.

## 2016-12-12 NOTE — Progress Notes (Signed)
ACUTE VISIT   HPI:  Chief Complaint  Patient presents with  . Otalgia  . Gum pain  . Sinus pressure    [name] is a 41 y.o. female, who is here today complaining of 2 weeks of intermittent upper gums achy pain, bilateral earache L>R,  She did have drainage from left ear for 2 weeks,resolved.  Recently did fly to [place] and symptoms are getting worse sine then.  Frontal pressure headaches.She denies associated visual changes. + Hx of allergies.She is not on treatment currently. She has not noted dental issues that could cause pain.  She has not identified exacerbating or alleviating factors.  Mild non productive cough. She denies fever, chills, body aches, dyspnea, wheezing, abdominal pain, nausea, or vomiting. No sick contact.  She has not tried OTC medication to treat symptoms.  History of DM 2, BS's reported as "good." + Smoker, has decreased cig/day.   Review of Systems  Constitutional: Negative for activity change, appetite change, fatigue and fever.  HENT: Positive for congestion, ear discharge, ear pain, postnasal drip, rhinorrhea and sinus pressure. Negative for dental problem, facial swelling, hearing loss, mouth sores, sore throat, trouble swallowing and voice change.   Eyes: Negative for discharge, redness and itching.  Respiratory: Positive for cough. Negative for chest tightness, shortness of breath and wheezing.   Gastrointestinal: Negative for abdominal pain, diarrhea, nausea and vomiting.  Musculoskeletal: Negative for gait problem, myalgias and neck pain.  Skin: Negative for rash.  Allergic/Immunologic: Positive for environmental allergies.  Neurological: Positive for headaches. Negative for syncope and weakness.  Hematological: Negative for adenopathy. Does not bruise/bleed easily.      Current Outpatient Prescriptions on File Prior to Visit  Medication Sig Dispense Refill  . amLODipine (NORVASC) 2.5 MG tablet TAKE 1 TABLET(2.5  MG) BY MOUTH DAILY 90 tablet 1  . empagliflozin (JARDIANCE) 25 MG TABS tablet Take 25 mg by mouth daily. 90 tablet 2  . glucose blood test strip Use to test blood sugar once daily. One Touch Verio Test Strips. 100 each 1  . lisinopril-hydrochlorothiazide (ZESTORETIC) 20-12.5 MG tablet Take 1 tablet by mouth daily. 90 tablet 2  . Na Sulfate-K Sulfate-Mg Sulf 17.5-3.13-1.6 GM/180ML SOLN Take 1 kit by mouth as directed. 354 mL 0  . ONETOUCH DELICA LANCETS 64P MISC Use to test blood sugar once daily. 100 each 1  . ranitidine (ZANTAC) 150 MG tablet Take 150 mg by mouth as needed.      No current facility-administered medications on file prior to visit.      Past Medical History:  Diagnosis Date  . Diabetes mellitus without complication ([place]) 32/9518  . Hypertension   . OSA (obstructive sleep apnea)    Allergies  Allergen Reactions  . Codeine     Unable to Urinate     Social History   Social History  . Marital status: Married    Spouse name: N/A  . Number of children: 1  . Years of education: N/A   Occupational History  . Technology Support Analyst    Social History Main Topics  . Smoking status: Current Some Day Smoker    Packs/day: 0.50  . Smokeless tobacco: Never Used     Comment: down to 3 cigarettes per day  . Alcohol use Yes     Comment: socially  . Drug use: No  . Sexual activity: Not Asked   Other Topics Concern  . None   Social History Narrative  . None  Vitals:   12/12/16 0718  BP: 118/78  Pulse: 96  Resp: 12  Temp: 98.1 F (36.7 C)  SpO2: 96%   Body mass index is 42.37 kg/m.   Physical Exam  Nursing note and vitals reviewed. Constitutional: She is oriented to person, place, and time. She appears well-developed. She does not appear ill. No distress.  HENT:  Head: Normocephalic and atraumatic.  Right Ear: Tympanic membrane, external ear and ear canal normal.  Left Ear: Tympanic membrane, external ear and ear canal normal.  Nose: Rhinorrhea  present. Right sinus exhibits frontal sinus tenderness. Right sinus exhibits no maxillary sinus tenderness. Left sinus exhibits frontal sinus tenderness. Left sinus exhibits no maxillary sinus tenderness.  Mouth/Throat: Oropharynx is clear and moist and mucous membranes are normal.  Mild pressure sensation frontal, bilateral. Normal transillumination. Hypertrophic turbinates. Postnasal drainage.   Eyes: Conjunctivae are normal.  Cardiovascular: Normal rate and regular rhythm.   No murmur heard. Respiratory: Effort normal and breath sounds normal. No respiratory distress.  Lymphadenopathy:       Head (right side): No preauricular and no posterior auricular adenopathy present.       Head (left side): No preauricular and no posterior auricular adenopathy present.    She has no cervical adenopathy.  Neurological: She is alert and oriented to person, place, and time. She has normal strength. Gait normal.  Skin: Skin is warm. No rash noted. No erythema.  Psychiatric: She has a normal mood and affect. Her speech is normal.  Well groomed, good eye contact.    ASSESSMENT AND PLAN:   [name] was seen today for otalgia, gum pain and sinus pressure.  Diagnoses and all orders for this visit:  Nasal sinus congestion  We discussed possible causes. I explained that I do not think abx is needed at this time but Rx for Augmentin given to start in 3-4 days of symptoms are not improved. We discussed some side effects of abx. Instructed about warnings. Smoking cessation encouraged. F/U as needed.  -     predniSONE (DELTASONE) 20 MG tablet; Take 2 tablets (40 mg total) by mouth daily with breakfast. -     fluticasone (FLONASE) 50 MCG/ACT nasal spray; Place 1 spray into both nostrils 2 (two) times daily.  Allergic rhinitis, unspecified seasonality, unspecified trigger  Recommended OTC antihistaminic. Because hx of HTN, I discouraged from decongestants use.  Oral course of Prednisone for 3 days  may also help.Side effects discussed, monitor BS's closely. Steam inhalations and nasal irrigations with saline as needed also may help.  Earache symptoms in both ears  Ear exam otherwise negative. Possible etiologies discussed: barometric changes due to recent travel, allergies,eustachian tube dysfunction among some.  Autoinflation maneuvers may also help. F/U as needed.  Other orders -     amoxicillin-clavulanate (AUGMENTIN) 875-125 MG tablet; Take 1 tablet by mouth 2 (two) times daily.    -[name] was advised to seek immediate medical attention if sudden worsening symptoms or to follow if they persist or if new concerns arise.       Betty G. Martinique, MD  [facility]. [place] office.

## 2017-01-13 DIAGNOSIS — E785 Hyperlipidemia, unspecified: Secondary | ICD-10-CM

## 2017-01-13 DIAGNOSIS — E1169 Type 2 diabetes mellitus with other specified complication: Secondary | ICD-10-CM | POA: Insufficient documentation

## 2017-01-13 NOTE — Progress Notes (Signed)
HPI:   Monica [name] is a 41 y.o. female, who is here today for 5 months follow up.   She was seen on 12/12/16 because respiratory symptoms, nasal sinus congestion.   Diabetes Mellitus II:  Dx 04/2016.  Currently on Jardiance 25 mg daily. Metformin caused GI side effects. Eye exam 05/2016 negative for retinopathy. Checking BS's : 130's Hypoglycemia: Denies  She is tolerating medications well. She denies abdominal pain, nausea, vomiting, polydipsia, polyuria, or polyphagia. No numbness, tingling, or burning.  Lab Results  Component Value Date   HGBA1C 7.6 (H) 08/13/2016   Lab Results  Component Value Date   MICROALBUR 1.0 08/13/2016    Hypertension:   Dx 2016-2017 Currently on Amlodipine 2.5 mg daily and Lisinopril-HCTZ 20-12.5 mg daily.   Home BP's: 120's/80-90's.  She is taking medications as instructed, no side effects reported.  She has not noted unusual headache, visual changes, exertional chest pain, dyspnea,  focal weakness, or edema.   Lab Results  Component Value Date   CREATININE 0.83 08/13/2016   BUN 11 08/13/2016   NA 136 08/13/2016   K 4.6 08/13/2016   CL 101 08/13/2016   CO2 26 08/13/2016     Hyperlipidemia:  Currently on non pharmacologic treatment. Following a low fat diet: Yes..   Lab Results  Component Value Date   CHOL 183 08/13/2016   HDL 54.60 08/13/2016   LDLCALC 112 (H) 08/13/2016   TRIG 79.0 08/13/2016   CHOLHDL 3 08/13/2016   Obesity:  Dietary changes since last OV: No red meat, plenty of fish, vegetables, and decreased starches. Exercise: None. She has "a lot pain", left knee, s/p 4 arthroscopies and follows with other.  Today sh is c/o fatigue, which is a chronic problem but seems to be worse. Allergic rhinitis, "crackling" chest at night, not sure about wheezing. Still coughing, mostly non productive. She denies associated chest tightness, dyspnea, palpitations, or fever.  She has appt with [facility] to help  with smoking cessation.  She denies depression. + Anxiety. Wife has weakness sleep apnea. She had sleep study many years ago and results were confusing.  She has Hx of headaches, "always" she has a headache. Dull,mild pain. Water and food seem to help. She has not identified exacerbating factors on this problem has been stable for years.   Review of Systems  Constitutional: Positive for fatigue. Negative for activity change, appetite change, fever and unexpected weight change.  HENT: Positive for congestion, postnasal drip, rhinorrhea and sinus pressure. Negative for facial swelling, mouth sores, nosebleeds, sore throat and trouble swallowing.   Eyes: Negative for redness and visual disturbance.  Respiratory: Positive for apnea and cough. Negative for shortness of breath and wheezing.   Cardiovascular: Negative for chest pain, palpitations and leg swelling.  Gastrointestinal: Negative for abdominal pain, nausea and vomiting.       Negative for changes in bowel habits.  Endocrine: Negative for cold intolerance, heat intolerance, polydipsia, polyphagia and polyuria.  Genitourinary: Negative for decreased urine volume, dysuria and hematuria.  Musculoskeletal: Positive for arthralgias. Negative for gait problem and myalgias.  Allergic/Immunologic: Positive for environmental allergies.  Neurological: Positive for headaches. Negative for syncope, weakness and numbness.  Hematological: Negative for adenopathy. Does not bruise/bleed easily.  Psychiatric/Behavioral: Negative for confusion. The patient is nervous/anxious.     Current Outpatient Medications on File Prior to Visit  Medication Sig Dispense Refill  . fluticasone (FLONASE) 50 MCG/ACT nasal spray Place 1 spray into both nostrils 2 (two)  times daily. 16 g 3  . glucose blood test strip Use to test blood sugar once daily. One Touch Verio Test Strips. 100 each 1  . lisinopril-hydrochlorothiazide (ZESTORETIC) 20-12.5 MG tablet Take 1  tablet by mouth daily. 90 tablet 2  . ONETOUCH DELICA LANCETS 38H MISC Use to test blood sugar once daily. 100 each 1  . ranitidine (ZANTAC) 150 MG tablet Take 150 mg by mouth as needed.      No current facility-administered medications on file prior to visit.      Past Medical History:  Diagnosis Date  . Diabetes mellitus without complication ([place]) 82/9937  . Hypertension   . OSA (obstructive sleep apnea)    Allergies  Allergen Reactions  . Codeine     Unable to Urinate     Social History   Socioeconomic History  . Marital status: Married    Spouse name: None  . Number of children: 1  . Years of education: None  . Highest education level: None  Social Needs  . Financial resource strain: None  . Food insecurity - worry: None  . Food insecurity - inability: None  . Transportation needs - medical: None  . Transportation needs - non-medical: None  Occupational History  . Occupation: Software engineer  Tobacco Use  . Smoking status: Current Some Day Smoker    Packs/day: 0.50  . Smokeless tobacco: Never Used  . Tobacco comment: down to 3 cigarettes per day  Substance and Sexual Activity  . Alcohol use: Yes    Comment: socially  . Drug use: No  . Sexual activity: None  Other Topics Concern  . None  Social History Narrative  . None    Vitals:   01/14/17 0717  BP: 120/84  Pulse: 95  Resp: 12  Temp: 98.4 F (36.9 C)  SpO2: 98%   Body mass index is 42.05 kg/m.  Wt Readings from Last 3 Encounters:  01/14/17 (!) 327 lb 8 oz (148.6 kg)  12/12/16 (!) 330 lb (149.7 kg)  08/13/16 (!) 325 lb 6 oz (147.6 kg)    Physical Exam  Nursing note and vitals reviewed. Constitutional: She is oriented to person, place, and time. She appears well-developed. No distress.  HENT:  Head: Normocephalic and atraumatic.  Nose: Right sinus exhibits no maxillary sinus tenderness and no frontal sinus tenderness. Left sinus exhibits no maxillary sinus tenderness and no  frontal sinus tenderness.  Mouth/Throat: Oropharynx is clear and moist and mucous membranes are normal.  Nasal voice. Hypertrophic turbinates.  Eyes: Conjunctivae and EOM are normal. Pupils are equal, round, and reactive to light.  Cardiovascular: Normal rate and regular rhythm.  No murmur heard. Pulses:      Dorsalis pedis pulses are 2+ on the right side, and 2+ on the left side.  Respiratory: Effort normal and breath sounds normal. No respiratory distress.  GI: Soft. She exhibits no mass. There is no hepatomegaly. There is no tenderness.  Musculoskeletal: She exhibits edema (Trace pitting LE edema,bilateral.).  Lymphadenopathy:    She has no cervical adenopathy.  Neurological: She is alert and oriented to person, place, and time. She has normal strength. No cranial nerve deficit. Coordination and gait normal.  Skin: Skin is warm. No erythema.  Psychiatric: She has a normal mood and affect.  Well groomed, good eye contact.   Foot exam 07/2016.   ASSESSMENT AND PLAN:   Ms. KIALA FARAJ was seen today for 5 months follow-up.  Diagnoses and all orders for this  visit:  Lab Results  Component Value Date   HGBA1C 7.9 (H) 01/14/2017   Lab Results  Component Value Date   CHOL 190 01/14/2017   HDL 53.60 01/14/2017   LDLCALC 116 (H) 01/14/2017   TRIG 101.0 01/14/2017   CHOLHDL 4 01/14/2017   Lab Results  Component Value Date   CREATININE 0.85 01/14/2017   BUN 14 01/14/2017   NA 136 01/14/2017   K 4.1 01/14/2017   CL 100 01/14/2017   CO2 27 01/14/2017    Uncontrolled type 2 diabetes mellitus with hyperglycemia ([place])  HgA1C has not been at goal. No changes in current management.  She has not tolerated Metformin in the past but she at least with trying combination Jardiance and Metformin if A1c is not at goal. Regular exercise and healthy diet with avoidance of added sugar food intake is an important part of treatment and recommended. Annual eye exam, periodic dental and  foot care recommended. F/U in 4 months  -     Basic metabolic panel -     Hemoglobin A1c -     Empagliflozin-Metformin HCl ER (SYNJARDY XR) 25-1000 MG TB24; Take 1 tablet by mouth daily.  Hypertension, essential, benign  Adequately controlled here in the office. Re[porting some DBP's elevated at home. No changes in current management for now. DASH diet recommended. Eye exam recommended annually. F/U in 4 months, before if needed.  BMI 40.0-44.9, adult [facility])  She has lost 2-3 pounds since her last visit in 11/2016. we discussed benefits of wt loss as well as adverse effects of obesity. Consistency with healthy diet and physical activity recommended.  Hyperlipidemia associated with type 2 diabetes mellitus ([place])  Continue nonpharmacologic treatment. Further recommendations will be given according to lab results. We discussed benefits of statin medication, which are usually recommended, patients with diabetes.  -     Lipid panel  Cough, persistent  We discussed possible etiologies, including residual symptoms from prior URI, allergies, COPD, asthma, and GERD among some. Today lung auscultation is negative. Recommend Albuterol inh 2 puff every 6 hours for a week then as needed for wheezing or shortness of breath.  Further recommendations will be given according to imaging. Strongly recommend smoking cessation.  -     DG Chest 2 View; Future -     albuterol (PROVENTIL HFA;VENTOLIN HFA) 108 (90 Base) MCG/ACT inhaler; Inhale 2 puffs into the lungs every 6 (six) hours as needed for wheezing or shortness of breath.  Sleep apnea, unspecified type  We discussed possible causes of fatigue, including OSA, among some.  She is reporting weakness sleep apnea, so she may need another sleep study. Referral to pulmonology placed.  -     Ambulatory referral to Pulmonology  Allergic rhinitis, unspecified seasonality, unspecified trigger  OTC antihistaminic daily recommended. Singulair 10  mg at bedtime added. Nasal irrigations with saline as needed.  -     montelukast (SINGULAIR) 10 MG tablet; Take 1 tablet (10 mg total) by mouth at bedtime.    -Monica [name] was advised to return sooner than planned today if new concerns arise.       Rozelle Caudle G. Martin[place], MD  [facility]. [place] office.

## 2017-01-14 ENCOUNTER — Encounter: Payer: Self-pay | Admitting: Family Medicine

## 2017-01-14 ENCOUNTER — Ambulatory Visit: Payer: BC Managed Care – PPO | Admitting: Family Medicine

## 2017-01-14 ENCOUNTER — Other Ambulatory Visit: Payer: Self-pay | Admitting: Family Medicine

## 2017-01-14 VITALS — BP 120/84 | HR 95 | Temp 98.4°F | Resp 12 | Ht 74.0 in | Wt 327.5 lb

## 2017-01-14 DIAGNOSIS — G4730 Sleep apnea, unspecified: Secondary | ICD-10-CM | POA: Diagnosis not present

## 2017-01-14 DIAGNOSIS — E1165 Type 2 diabetes mellitus with hyperglycemia: Secondary | ICD-10-CM | POA: Diagnosis not present

## 2017-01-14 DIAGNOSIS — I10 Essential (primary) hypertension: Secondary | ICD-10-CM

## 2017-01-14 DIAGNOSIS — E1169 Type 2 diabetes mellitus with other specified complication: Secondary | ICD-10-CM

## 2017-01-14 DIAGNOSIS — E785 Hyperlipidemia, unspecified: Secondary | ICD-10-CM

## 2017-01-14 DIAGNOSIS — Z6841 Body Mass Index (BMI) 40.0 and over, adult: Secondary | ICD-10-CM | POA: Diagnosis not present

## 2017-01-14 DIAGNOSIS — R05 Cough: Secondary | ICD-10-CM | POA: Diagnosis not present

## 2017-01-14 DIAGNOSIS — J309 Allergic rhinitis, unspecified: Secondary | ICD-10-CM | POA: Diagnosis not present

## 2017-01-14 DIAGNOSIS — R053 Chronic cough: Secondary | ICD-10-CM

## 2017-01-14 LAB — LIPID PANEL
CHOL/HDL RATIO: 4
Cholesterol: 190 mg/dL (ref 0–200)
HDL: 53.6 mg/dL (ref >39.00)
LDL Cholesterol: 116 mg/dL — ABNORMAL HIGH (ref 0–99)
NONHDL: 136.68
TRIGLYCERIDES: 101 mg/dL (ref 0.0–149.0)
VLDL: 20.2 mg/dL (ref 0.0–40.0)

## 2017-01-14 LAB — BASIC METABOLIC PANEL
BUN: 14 mg/dL (ref 6–23)
CALCIUM: 9.7 mg/dL (ref 8.4–10.5)
CO2: 27 meq/L (ref 19–32)
CREATININE: 0.85 mg/dL (ref 0.40–1.20)
Chloride: 100 mEq/L (ref 96–112)
GFR: 93.53 mL/min (ref >60.00)
Glucose, Bld: 162 mg/dL — ABNORMAL HIGH (ref 70–99)
Potassium: 4.1 mEq/L (ref 3.5–5.1)
Sodium: 136 mEq/L (ref 135–145)

## 2017-01-14 LAB — HEMOGLOBIN A1C: Hgb A1c MFr Bld: 7.9 % — ABNORMAL HIGH (ref 4.6–6.5)

## 2017-01-14 MED ORDER — ALBUTEROL SULFATE HFA 108 (90 BASE) MCG/ACT IN AERS: 2.0000 | INHALATION_SPRAY | Freq: Four times a day (QID) | RESPIRATORY_TRACT | 1 refills | Status: DC | PRN

## 2017-01-14 MED ORDER — MONTELUKAST SODIUM 10 MG PO TABS: 10.0000 mg | ORAL_TABLET | Freq: Every day | ORAL | 3 refills | Status: DC

## 2017-01-14 NOTE — Patient Instructions (Signed)
A few things to remember from today's visit:   Uncontrolled type 2 diabetes mellitus with hyperglycemia ([place]) - Plan: Basic metabolic panel, Hemoglobin A1c  Hypertension, essential, benign  BMI 40.0-44.9, adult (HCC)  Hyperlipidemia associated with type 2 diabetes mellitus ([place]) - Plan: Lipid panel  Cough, persistent - Plan: DG Chest 2 View  Sleep apnea, unspecified type - Plan: Ambulatory referral to Pulmonology  Albuterol inh 2 puff every 6 hours for a week then as needed for wheezing or shortness of breath.  Zyrtec 10 mg morning.  Today X ray was ordered.  This can be done at [facility] at [facility] between 8 am and 5 pm: [place]. [phone].   Please be sure medication list is accurate. If a new problem present, please set up appointment sooner than planned today.

## 2017-01-15 ENCOUNTER — Encounter: Payer: Self-pay | Admitting: Family Medicine

## 2017-01-15 MED ORDER — EMPAGLIFLOZIN-METFORMIN HCL ER 25-1000 MG PO TB24: 1.0000 | ORAL_TABLET | Freq: Every day | ORAL | 3 refills | Status: DC

## 2017-01-18 ENCOUNTER — Ambulatory Visit (INDEPENDENT_AMBULATORY_CARE_PROVIDER_SITE_OTHER)
Admission: RE | Admit: 2017-01-18 | Discharge: 2017-01-18 | Disposition: A | Discharge status: Home / Self Care | Payer: BC Managed Care – PPO | Source: Ambulatory Visit | Attending: Family Medicine | Admitting: Family Medicine

## 2017-01-18 DIAGNOSIS — R05 Cough: Secondary | ICD-10-CM

## 2017-01-18 DIAGNOSIS — R053 Chronic cough: Secondary | ICD-10-CM

## 2017-01-19 ENCOUNTER — Encounter: Payer: Self-pay | Admitting: Family Medicine

## 2017-02-25 ENCOUNTER — Institutional Professional Consult (permissible substitution): Payer: BC Managed Care – PPO | Admitting: Pulmonary Disease

## 2017-03-29 ENCOUNTER — Other Ambulatory Visit: Payer: Self-pay | Admitting: Family Medicine

## 2017-03-29 DIAGNOSIS — R0981 Nasal congestion: Secondary | ICD-10-CM

## 2017-04-07 ENCOUNTER — Other Ambulatory Visit: Payer: Self-pay | Admitting: Family Medicine

## 2017-04-07 DIAGNOSIS — I10 Essential (primary) hypertension: Secondary | ICD-10-CM

## 2017-04-30 ENCOUNTER — Other Ambulatory Visit: Payer: Self-pay | Admitting: Family Medicine

## 2017-04-30 DIAGNOSIS — J309 Allergic rhinitis, unspecified: Secondary | ICD-10-CM

## 2017-05-08 ENCOUNTER — Other Ambulatory Visit: Payer: Self-pay | Admitting: Family Medicine

## 2017-05-08 DIAGNOSIS — I10 Essential (primary) hypertension: Secondary | ICD-10-CM

## 2017-05-14 ENCOUNTER — Ambulatory Visit: Payer: BC Managed Care – PPO | Admitting: Family Medicine

## 2017-05-14 ENCOUNTER — Encounter: Payer: Self-pay | Admitting: Family Medicine

## 2017-05-14 VITALS — BP 124/82 | HR 87 | Temp 97.9°F | Resp 12 | Ht 74.0 in | Wt 314.1 lb

## 2017-05-14 DIAGNOSIS — R0789 Other chest pain: Secondary | ICD-10-CM

## 2017-05-14 DIAGNOSIS — K219 Gastro-esophageal reflux disease without esophagitis: Secondary | ICD-10-CM | POA: Insufficient documentation

## 2017-05-14 MED ORDER — OMEPRAZOLE 40 MG PO CPDR: 40.0000 mg | DELAYED_RELEASE_CAPSULE | Freq: Every day | ORAL | 3 refills | Status: DC

## 2017-05-14 NOTE — Progress Notes (Signed)
ACUTE VISIT   HPI:  Chief Complaint  Patient presents with  . Pain in chest when eating    sharp/shooting pain when eating solid foods. 1st noticed 1 month ago, went away and then happened again yesterday    [name] is a 42 y.o. female, who is here today complaining of a month of intermittent lower mid chest and epigastric "spasms." Sharp pain, no radiated. Gradual onset. Symptoms are not related with exertion.  She has history of GERD, she has not noted any heartburn. Pain started a month ago after several hours of not eating.  Subsequent episodes aggravated by food intake, basically solids. Symptoms lasted for 3 days a month ago. She denies dysphagia or odynophagia.  Problem started again yesterday after eating. If she eats soups she does not have a problem.  She had potato chips for breakfast this morning, which she tolerated well.  She has not identified alleviating factors.  She has not try OTC medications. She denies associated nausea, vomiting, changes in bowel habits, melena, or blood in the stool.  She has a mild cough, which she attributes to allergies. She has no noted dyspnea or wheezing.  DM 2, reporting "good" BS's   Review of Systems  Constitutional: Negative for activity change, appetite change, diaphoresis, fatigue, fever and unexpected weight change.  HENT: Positive for congestion, postnasal drip and rhinorrhea. Negative for mouth sores, sore throat and trouble swallowing.   Respiratory: Negative for cough, shortness of breath and wheezing.   Gastrointestinal: Positive for abdominal pain. Negative for abdominal distention, blood in stool, nausea and vomiting.       No changes in bowel habits.  Endocrine: Negative for cold intolerance and heat intolerance.  Musculoskeletal: Negative for back pain and myalgias.  Skin: Negative for rash.  Allergic/Immunologic: Negative for food allergies.  Neurological: Negative for syncope and  weakness.  Hematological: Negative for adenopathy. Does not bruise/bleed easily.      Current Outpatient Medications on File Prior to Visit  Medication Sig Dispense Refill  . albuterol (PROVENTIL HFA;VENTOLIN HFA) 108 (90 Base) MCG/ACT inhaler Inhale 2 puffs into the lungs every 6 (six) hours as needed for wheezing or shortness of breath. 1 Inhaler 1  . amLODipine (NORVASC) 2.5 MG tablet TAKE 1 TABLET(2.5 MG) BY MOUTH DAILY 90 tablet 0  . fluticasone (FLONASE) 50 MCG/ACT nasal spray SHAKE LIQUID AND USE 1 SPRAY IN EACH NOSTRIL TWICE DAILY 16 g 1  . glucose blood test strip Use to test blood sugar once daily. One Touch Verio Test Strips. 100 each 1  . JARDIANCE 25 MG TABS tablet TK 1 T PO QD  2  . lisinopril-hydrochlorothiazide (PRINZIDE,ZESTORETIC) 20-12.5 MG tablet TAKE 1 TABLET BY MOUTH DAILY 90 tablet 0  . montelukast (SINGULAIR) 10 MG tablet TAKE 1 TABLET(10 MG) BY MOUTH AT BEDTIME 30 tablet 0  . ONETOUCH DELICA LANCETS 69G MISC Use to test blood sugar once daily. 100 each 1  . ranitidine (ZANTAC) 150 MG tablet Take 150 mg by mouth as needed.     . Empagliflozin-Metformin HCl ER (SYNJARDY XR) 25-1000 MG TB24 Take 1 tablet by mouth daily. (Patient not taking: Reported on 05/14/2017) 30 tablet 3   No current facility-administered medications on file prior to visit.      Past Medical History:  Diagnosis Date  . Diabetes mellitus without complication ([place]) 29/5284  . Hypertension   . OSA (obstructive sleep apnea)    Allergies  Allergen Reactions  . Codeine  Unable to Urinate     Social History   Socioeconomic History  . Marital status: Married    Spouse name: Not on file  . Number of children: 1  . Years of education: Not on file  . Highest education level: Not on file  Occupational History  . Occupation: Software engineer  Social Needs  . Financial resource strain: Not on file  . Food insecurity:    Worry: Not on file    Inability: Not on file  .  Transportation needs:    Medical: Not on file    Non-medical: Not on file  Tobacco Use  . Smoking status: Current Some Day Smoker    Packs/day: 0.50  . Smokeless tobacco: Never Used  . Tobacco comment: down to 3 cigarettes per day  Substance and Sexual Activity  . Alcohol use: Yes    Comment: socially  . Drug use: No  . Sexual activity: Not on file  Lifestyle  . Physical activity:    Days per week: Not on file    Minutes per session: Not on file  . Stress: Not on file  Relationships  . Social connections:    Talks on phone: Not on file    Gets together: Not on file    Attends religious service: Not on file    Active member of club or organization: Not on file    Attends meetings of clubs or organizations: Not on file    Relationship status: Not on file  Other Topics Concern  . Not on file  Social History Narrative  . Not on file    Vitals:   05/14/17 0838  BP: 124/82  Pulse: 87  Resp: 12  Temp: 97.9 F (36.6 C)  SpO2: 95%   Body mass index is 40.33 kg/m.   Physical Exam  Nursing note and vitals reviewed. Constitutional: She is oriented to person, place, and time. She appears well-developed. She does not appear ill. No distress.  HENT:  Head: Normocephalic and atraumatic.  Mouth/Throat: Oropharynx is clear and moist and mucous membranes are normal.  Eyes: Conjunctivae are normal. No scleral icterus.  Cardiovascular: Normal rate and regular rhythm.  No murmur heard. Respiratory: Effort normal and breath sounds normal. No respiratory distress. She exhibits no tenderness.  GI: Soft. Bowel sounds are normal. She exhibits no distension and no mass. There is no hepatomegaly. There is tenderness in the epigastric area. There is no rigidity and no guarding.  Musculoskeletal: She exhibits no edema.  Lymphadenopathy:    She has no cervical adenopathy.  Neurological: She is alert and oriented to person, place, and time. She has normal strength. Gait normal.  Skin: Skin  is warm. No rash noted. No erythema.  Psychiatric: She has a normal mood and affect.  Well groomed, good eye contact.      ASSESSMENT AND PLAN:   [name] was seen today for pain in chest when eating.  Diagnoses and all orders for this visit:  Chest discomfort  We discussed possible etiologies, it seems to be related to GERD. She was instructed about warning signs. I do not think further workup is needed at this time. Follow-up in 4 weeks, before if needed.   GERD (gastroesophageal reflux disease) Recommend starting Omeprazole 40 mg daily. GERD precautions discussed.  instructed about warning signs. Smoking cessation is strongly recommended. Follow-up in 4 weeks.    Return in about 1 month (around 06/11/2017) for DM, GERD.     -MonicaKenadee MALCOLM HETZ was  advised to seek immediate medical attention if sudden worsening symptoms.      Haneef Hallquist G. [place], MD  [facility]. [place] office.

## 2017-05-14 NOTE — Patient Instructions (Signed)
A few things to remember from today's visit:   Chest discomfort  Gastroesophageal reflux disease, esophagitis presence not specified   GERD:  Avoid foods that make your symptoms worse, for example coffee, chocolate,pepermeint,alcohol, and greasy food. Raising the head of your bed about 6 inches may help with nocturnal symptoms.  Avoid tobacco use. Weight loss (if you are overweight). Avoid lying down for 3 hours after eating.  Instead 3 large meals daily try small and more frequent meals during the day.  Every medication have side effects and medications for GERD are not the exception.At this time I think benefit is greater than risk.    You should be evaluated immediately if bloody vomiting, bloody stools, black stools (like tar), difficulty swallowing, food gets stuck on the way down or choking when eating. Abnormal weight loss or severe abdominal pain.  If symptoms are not resolved sometimes endoscopy is necessary.  Please be sure medication list is accurate. If a new problem present, please set up appointment sooner than planned today.

## 2017-05-14 NOTE — Assessment & Plan Note (Addendum)
Recommend starting Omeprazole 40 mg daily. GERD precautions discussed.  instructed about warning signs. Smoking cessation is strongly recommended. Follow-up in 4 weeks.

## 2017-06-09 ENCOUNTER — Other Ambulatory Visit: Payer: Self-pay | Admitting: Family Medicine

## 2017-06-09 DIAGNOSIS — E1165 Type 2 diabetes mellitus with hyperglycemia: Secondary | ICD-10-CM

## 2017-06-09 DIAGNOSIS — J309 Allergic rhinitis, unspecified: Secondary | ICD-10-CM

## 2017-07-10 ENCOUNTER — Other Ambulatory Visit: Payer: Self-pay | Admitting: Family Medicine

## 2017-07-10 DIAGNOSIS — I10 Essential (primary) hypertension: Secondary | ICD-10-CM

## 2017-07-11 ENCOUNTER — Ambulatory Visit: Payer: BC Managed Care – PPO | Admitting: Family Medicine

## 2017-07-11 ENCOUNTER — Encounter: Payer: Self-pay | Admitting: Family Medicine

## 2017-07-11 VITALS — BP 124/83 | HR 83 | Temp 98.1°F | Resp 12 | Ht 74.0 in | Wt 303.4 lb

## 2017-07-11 DIAGNOSIS — J069 Acute upper respiratory infection, unspecified: Secondary | ICD-10-CM

## 2017-07-11 DIAGNOSIS — R05 Cough: Secondary | ICD-10-CM

## 2017-07-11 DIAGNOSIS — J309 Allergic rhinitis, unspecified: Secondary | ICD-10-CM | POA: Diagnosis not present

## 2017-07-11 DIAGNOSIS — R1032 Left lower quadrant pain: Secondary | ICD-10-CM

## 2017-07-11 DIAGNOSIS — R059 Cough, unspecified: Secondary | ICD-10-CM

## 2017-07-11 MED ORDER — HYDROCODONE-HOMATROPINE 5-1.5 MG/5ML PO SYRP: 5.0000 mL | ORAL_SOLUTION | Freq: Two times a day (BID) | ORAL | 0 refills | Status: DC | PRN

## 2017-07-11 NOTE — Progress Notes (Signed)
ACUTE VISIT  HPI:  Chief Complaint  Patient presents with  . Cough    sx started Monday, taking OTC medication  . Nasal Congestion  . Headache    [name] is a 42 y.o.female here today complaining of 4 days of respiratory symptoms.  Started with nonproductive cough, sore throat, nasal congestion, rhinorrhea. + Fatigue and muscle aches.  URI   This is a new problem. The current episode started in the past 7 days. The problem has been gradually improving. There has been no fever. Associated symptoms include congestion, coughing, diarrhea, nausea, a plugged ear sensation, rhinorrhea, sneezing and a sore throat. Pertinent negatives include no abdominal pain, chest pain, ear pain, headaches, rash, sinus pain, swollen glands, vomiting or wheezing. She has tried decongestant for the symptoms. The treatment provided mild relief.    She has some loose stools and mild nausea, she has not identified exacerbating or alleviating factors. She has not noted mucus or blood in the stool.   No Hx of recent travel. His wife has been sick with similar symptoms. No known insect bite.  Hx of allergies: History of allergic rhinitis and asthma.  She has not noted wheezing or dyspnea. Cough seems to be better today. He is exacerbated by laying down, is interfering with sleep. Alleviated by sitting up.   "Little" sputum, no hemoptysis. She mentioned that she noticed some blood when she was brushing her teeth this morning.  TheraFlu. OTC medications for this problem: TheraFlu.  Symptoms otherwise stable.  Today she has had LLQ abdominal pain upon coughing. No urinary symptoms.  Review of Systems  Constitutional: Positive for activity change and fatigue. Negative for appetite change and fever.  HENT: Positive for congestion, postnasal drip, rhinorrhea, sneezing and sore throat. Negative for ear pain, mouth sores, sinus pressure, sinus pain, trouble swallowing and voice change.    Eyes: Negative for discharge, redness and itching.  Respiratory: Positive for cough. Negative for shortness of breath and wheezing.   Cardiovascular: Negative.  Negative for chest pain.  Gastrointestinal: Positive for diarrhea and nausea. Negative for abdominal pain and vomiting.  Musculoskeletal: Positive for myalgias. Negative for gait problem and joint swelling.  Skin: Negative for rash.  Allergic/Immunologic: Positive for environmental allergies.  Neurological: Negative for weakness and headaches.  Hematological: Negative for adenopathy. Does not bruise/bleed easily.      Current Outpatient Medications on File Prior to Visit  Medication Sig Dispense Refill  . albuterol (PROVENTIL HFA;VENTOLIN HFA) 108 (90 Base) MCG/ACT inhaler Inhale 2 puffs into the lungs every 6 (six) hours as needed for wheezing or shortness of breath. 1 Inhaler 1  . amLODipine (NORVASC) 2.5 MG tablet TAKE 1 TABLET(2.5 MG) BY MOUTH DAILY 90 tablet 0  . Empagliflozin-Metformin HCl ER (SYNJARDY XR) 25-1000 MG TB24 Take 1 tablet by mouth daily. 30 tablet 3  . fluticasone (FLONASE) 50 MCG/ACT nasal spray SHAKE LIQUID AND USE 1 SPRAY IN EACH NOSTRIL TWICE DAILY 16 g 1  . glucose blood test strip Use to test blood sugar once daily. One Touch Verio Test Strips. 100 each 1  . lisinopril-hydrochlorothiazide (PRINZIDE,ZESTORETIC) 20-12.5 MG tablet TAKE 1 TABLET BY MOUTH DAILY 90 tablet 0  . montelukast (SINGULAIR) 10 MG tablet TAKE 1 TABLET(10 MG) BY MOUTH AT BEDTIME 90 tablet 0  . omeprazole (PRILOSEC) 40 MG capsule Take 1 capsule (40 mg total) by mouth daily. 30 capsule 3  . ONETOUCH DELICA LANCETS 10C MISC Use to test blood sugar  once daily. 100 each 1  . ranitidine (ZANTAC) 150 MG tablet Take 150 mg by mouth as needed.      No current facility-administered medications on file prior to visit.      Past Medical History:  Diagnosis Date  . Diabetes mellitus without complication ([place]) 41/9379  . Hypertension   . OSA  (obstructive sleep apnea)    Allergies  Allergen Reactions  . Codeine     Unable to Urinate     Social History   Socioeconomic History  . Marital status: Married    Spouse name: Not on file  . Number of children: 1  . Years of education: Not on file  . Highest education level: Not on file  Occupational History  . Occupation: Software engineer  Social Needs  . Financial resource strain: Not on file  . Food insecurity:    Worry: Not on file    Inability: Not on file  . Transportation needs:    Medical: Not on file    Non-medical: Not on file  Tobacco Use  . Smoking status: Current Some Day Smoker    Packs/day: 0.50  . Smokeless tobacco: Never Used  . Tobacco comment: down to 3 cigarettes per day  Substance and Sexual Activity  . Alcohol use: Yes    Comment: socially  . Drug use: No  . Sexual activity: Not on file  Lifestyle  . Physical activity:    Days per week: Not on file    Minutes per session: Not on file  . Stress: Not on file  Relationships  . Social connections:    Talks on phone: Not on file    Gets together: Not on file    Attends religious service: Not on file    Active member of club or organization: Not on file    Attends meetings of clubs or organizations: Not on file    Relationship status: Not on file  Other Topics Concern  . Not on file  Social History Narrative  . Not on file    Vitals:   07/11/17 1040  BP: 124/83  Pulse: 83  Resp: 12  Temp: 98.1 F (36.7 C)  SpO2: 97%   Body mass index is 38.95 kg/m.    Physical Exam  Nursing note and vitals reviewed. Constitutional: She is oriented to person, place, and time. She appears well-developed. She does not appear ill. No distress.  HENT:  Head: Normocephalic and atraumatic.  Right Ear: Tympanic membrane, external ear and ear canal normal.  Left Ear: Tympanic membrane, external ear and ear canal normal.  Nose: Rhinorrhea present. Right sinus exhibits no maxillary sinus  tenderness and no frontal sinus tenderness. Left sinus exhibits no maxillary sinus tenderness and no frontal sinus tenderness.  Mouth/Throat: Oropharynx is clear and moist and mucous membranes are normal.  Hypertrophic turbinates. Nasal voice. Postnasal drainage.  Eyes: Conjunctivae are normal.  Cardiovascular: Normal rate and regular rhythm.  No murmur heard. Respiratory: Effort normal and breath sounds normal. No respiratory distress.  GI: Soft. She exhibits no mass. There is no hepatomegaly. There is no tenderness.  Musculoskeletal: She exhibits no edema.  Lymphadenopathy:    She has no cervical adenopathy.  Neurological: She is alert and oriented to person, place, and time. She has normal strength. Gait normal.  Skin: Skin is warm. No rash noted. No erythema.  Psychiatric: She has a normal mood and affect.  Well groomed, good eye contact.    ASSESSMENT AND PLAN:  Ms. Tristina was seen today for cough, nasal congestion and headache.  Diagnoses and all orders for this visit:  URI, acute  Explained that it is most likely viral, so symptomatic treatment recommended. Symptoms seem to be gradually improving. Instructed about warning signs, including new onset of fever.  Allergic rhinitis, unspecified seasonality, unspecified trigger  Could be aggravating symptoms. Continue with Singulair 10 mg and Flonase nasal spray. Zyrtec 10 mg daily in the morning recommended. Saline nasal irrigations will also help.  Cough  Some side effects of medication discussed. I recommend taking it mainly at night. Explained that cough and congestion after URI could last a few days and even weeks.  -     HYDROcodone-homatropine (HYCODAN) 5-1.5 MG/5ML syrup; Take 5 mLs by mouth every 12 (twelve) hours as needed for cough.  LLQ abdominal pain  ?  Muscle related. Abdominal examination otherwise negative. Recommend continue monitoring for new associated symptoms.     Amna Welker G. Martinique,  MD  [facility]. [place] office.

## 2017-07-11 NOTE — Patient Instructions (Addendum)
A few things to remember from today's visit:   URI, acute  Allergic rhinitis, unspecified seasonality, unspecified trigger  Cough  viral infections are self-limited and we treat each symptom depending of severity.  Over the counter medications as decongestants and cold medications usually help, they need to be taken with caution if there is a history of high blood pressure or palpitations. Tylenol and/or Ibuprofen also helps with most symptoms (headache, muscle aching, fever,etc) Plenty of fluids. Honey helps with cough. Steam inhalations helps with runny nose, nasal congestion, and may prevent sinus infections. Cough and nasal congestion could last a few days and sometimes weeks. Please follow in not any better in 1-2 weeks or if symptoms get worse.  Nasal irrigations with saline may also help. Zyrtec 10 mg morning.  Please be sure medication list is accurate. If a new problem present, please set up appointment sooner than planned today.

## 2017-07-22 ENCOUNTER — Institutional Professional Consult (permissible substitution): Payer: BC Managed Care – PPO | Admitting: Pulmonary Disease

## 2017-07-31 ENCOUNTER — Other Ambulatory Visit: Payer: Self-pay | Admitting: Family Medicine

## 2017-07-31 DIAGNOSIS — R05 Cough: Secondary | ICD-10-CM

## 2017-07-31 DIAGNOSIS — R053 Chronic cough: Secondary | ICD-10-CM

## 2017-08-07 ENCOUNTER — Ambulatory Visit: Payer: BC Managed Care – PPO | Admitting: Family Medicine

## 2017-08-07 ENCOUNTER — Encounter: Payer: Self-pay | Admitting: Family Medicine

## 2017-08-07 ENCOUNTER — Encounter: Payer: Self-pay | Admitting: *Deleted

## 2017-08-07 VITALS — BP 120/82 | HR 73 | Temp 98.3°F | Resp 12 | Ht 74.0 in | Wt 302.0 lb

## 2017-08-07 DIAGNOSIS — E1165 Type 2 diabetes mellitus with hyperglycemia: Secondary | ICD-10-CM

## 2017-08-07 DIAGNOSIS — J4520 Mild intermittent asthma, uncomplicated: Secondary | ICD-10-CM | POA: Insufficient documentation

## 2017-08-07 DIAGNOSIS — K219 Gastro-esophageal reflux disease without esophagitis: Secondary | ICD-10-CM

## 2017-08-07 DIAGNOSIS — I10 Essential (primary) hypertension: Secondary | ICD-10-CM

## 2017-08-07 DIAGNOSIS — M62838 Other muscle spasm: Secondary | ICD-10-CM | POA: Diagnosis not present

## 2017-08-07 DIAGNOSIS — R05 Cough: Secondary | ICD-10-CM | POA: Diagnosis not present

## 2017-08-07 DIAGNOSIS — R053 Chronic cough: Secondary | ICD-10-CM

## 2017-08-07 LAB — COMPREHENSIVE METABOLIC PANEL
ALT: 14 U/L (ref 0–35)
AST: 13 U/L (ref 0–37)
Albumin: 3.9 g/dL (ref 3.5–5.2)
Alkaline Phosphatase: 71 U/L (ref 39–117)
BUN: 13 mg/dL (ref 6–23)
CALCIUM: 9.6 mg/dL (ref 8.4–10.5)
CHLORIDE: 102 meq/L (ref 96–112)
CO2: 29 meq/L (ref 19–32)
Creatinine, Ser: 0.89 mg/dL (ref 0.40–1.20)
GFR: 88.47 mL/min (ref >60.00)
GLUCOSE: 132 mg/dL — AB (ref 70–99)
Potassium: 4.2 mEq/L (ref 3.5–5.1)
Sodium: 139 mEq/L (ref 135–145)
Total Bilirubin: 0.4 mg/dL (ref 0.2–1.2)
Total Protein: 7 g/dL (ref 6.0–8.3)

## 2017-08-07 LAB — MICROALBUMIN / CREATININE URINE RATIO
CREATININE, U: 142.5 mg/dL
MICROALB UR: 0.8 mg/dL (ref 0.0–1.9)
MICROALB/CREAT RATIO: 0.6 mg/g (ref 0.0–30.0)

## 2017-08-07 LAB — HEMOGLOBIN A1C: Hgb A1c MFr Bld: 7.4 % — ABNORMAL HIGH (ref 4.6–6.5)

## 2017-08-07 LAB — CK: CK TOTAL: 174 U/L (ref 7–177)

## 2017-08-07 MED ORDER — LOSARTAN POTASSIUM-HCTZ 50-12.5 MG PO TABS: 1.0000 | ORAL_TABLET | Freq: Every day | ORAL | 1 refills | Status: DC

## 2017-08-07 MED ORDER — BACLOFEN 10 MG PO TABS: 10.0000 mg | ORAL_TABLET | Freq: Every evening | ORAL | 0 refills | Status: DC | PRN

## 2017-08-07 MED ORDER — AZITHROMYCIN 250 MG PO TABS: ORAL_TABLET | ORAL | 0 refills | Status: AC

## 2017-08-07 MED ORDER — FLUTICASONE PROPIONATE HFA 220 MCG/ACT IN AERO: 2.0000 | INHALATION_SPRAY | Freq: Two times a day (BID) | RESPIRATORY_TRACT | 3 refills | Status: DC

## 2017-08-07 NOTE — Addendum Note (Signed)
Addended by: Martinique, BETTY G on: 08/07/2017 01:31 PM   Modules accepted: Orders

## 2017-08-07 NOTE — Progress Notes (Addendum)
ACUTE VISIT   HPI:  Chief Complaint  Patient presents with  . Cough    cough with phlem, hasn't gone away  . Chest spams    previous issue that has returmed and seems like it is getting worse since Saturday  . Follow-up    Monica [name] is a 42 y.o. female, who is here today complaining of persistent cough and chest wall "spasms."  I saw her for acute visit on 07/11/2017, at that time she had nonproductive cough but now it has been productive, some phlegm, denies hemoptysis. She has not noted fever or chills. History of GERD, she has not noted heartburn.  She is on omeprazole 40 mg daily.  Denies abdominal pain, nausea, vomiting, changes in bowel habits, or melena.  History of asthma, she has been using albuterol inhaler daily. She is currently on Singulair 10 mg daily. Allergic rhinitis on Flonase nasal spray.  Intermittent wheezing and cough, exacerbated by exertion.  "Squeezing" like sensation on anterior chest that started about 5 days ago. She has had these spasms intermittently for years but they seem more frequent now. No associated palpitations or diaphoresis.  She has not identified exacerbating or alleviating factors. Episodes can last seconds or minutes.  DM 2:  Last follow-up in 12/2016. Hg A1c was 7.9. Currently she is on empagliflozin-metformin ER 25-1000 mg daily. Denies abdominal pain, nausea,vomiting, polydipsia,polyuria, or polyphagia.  Hypertension: She is on lisinopril-HCTZ 20-12.5 mg daily. Tolerating medication well, she has not noted side effects. She is not checking BP at home.   Review of Systems  Constitutional: Positive for fatigue. Negative for activity change, appetite change, fever and unexpected weight change.  HENT: Positive for congestion, postnasal drip and rhinorrhea. Negative for mouth sores, nosebleeds, sore throat and trouble swallowing.   Eyes: Negative for redness and visual disturbance.  Respiratory: Positive  for cough, shortness of breath and wheezing.   Cardiovascular: Negative for palpitations and leg swelling.  Gastrointestinal: Negative for abdominal pain, nausea and vomiting.       Negative for changes in bowel habits.  Endocrine: Negative for polydipsia, polyphagia and polyuria.  Genitourinary: Negative for decreased urine volume, dysuria and hematuria.  Musculoskeletal: Negative for gait problem and myalgias.  Skin: Negative for rash and wound.  Allergic/Immunologic: Positive for environmental allergies.  Neurological: Negative for syncope, weakness, numbness and headaches.      Current Outpatient Medications on File Prior to Visit  Medication Sig Dispense Refill  . albuterol (PROVENTIL HFA;VENTOLIN HFA) 108 (90 Base) MCG/ACT inhaler INHALE 2 PUFFS INTO THE LUNGS EVERY 6 HOURS AS NEEDED FOR WHEEZING OR SHORTNESS OF BREATH 8.5 g 0  . amLODipine (NORVASC) 2.5 MG tablet TAKE 1 TABLET(2.5 MG) BY MOUTH DAILY 90 tablet 0  . fluticasone (FLONASE) 50 MCG/ACT nasal spray SHAKE LIQUID AND USE 1 SPRAY IN EACH NOSTRIL TWICE DAILY 16 g 1  . glucose blood test strip Use to test blood sugar once daily. One Touch Verio Test Strips. 100 each 1  . montelukast (SINGULAIR) 10 MG tablet TAKE 1 TABLET(10 MG) BY MOUTH AT BEDTIME 90 tablet 0  . omeprazole (PRILOSEC) 40 MG capsule Take 1 capsule (40 mg total) by mouth daily. 30 capsule 3  . ONETOUCH DELICA LANCETS 57Q MISC Use to test blood sugar once daily. 100 each 1  . ranitidine (ZANTAC) 150 MG tablet Take 150 mg by mouth as needed.      No current facility-administered medications on file prior to visit.  Past Medical History:  Diagnosis Date  . Diabetes mellitus without complication ([place]) 95/1884  . Hypertension   . OSA (obstructive sleep apnea)    Allergies  Allergen Reactions  . Codeine     Unable to Urinate     Social History   Socioeconomic History  . Marital status: Married    Spouse name: Not on file  . Number of children: 1    . Years of education: Not on file  . Highest education level: Not on file  Occupational History  . Occupation: Software engineer  Social Needs  . Financial resource strain: Not on file  . Food insecurity:    Worry: Not on file    Inability: Not on file  . Transportation needs:    Medical: Not on file    Non-medical: Not on file  Tobacco Use  . Smoking status: Current Some Day Smoker    Packs/day: 0.50  . Smokeless tobacco: Never Used  . Tobacco comment: down to 3 cigarettes per day  Substance and Sexual Activity  . Alcohol use: Yes    Comment: socially  . Drug use: No  . Sexual activity: Not on file  Lifestyle  . Physical activity:    Days per week: Not on file    Minutes per session: Not on file  . Stress: Not on file  Relationships  . Social connections:    Talks on phone: Not on file    Gets together: Not on file    Attends religious service: Not on file    Active member of club or organization: Not on file    Attends meetings of clubs or organizations: Not on file    Relationship status: Not on file  Other Topics Concern  . Not on file  Social History Narrative  . Not on file    Vitals:   08/07/17 0728  BP: 120/82  Pulse: 73  Resp: 12  Temp: 98.3 F (36.8 C)  SpO2: 98%   Body mass index is 38.77 kg/m.   Physical Exam  Nursing note and vitals reviewed. Constitutional: She is oriented to person, place, and time. She appears well-developed. No distress.  HENT:  Head: Normocephalic and atraumatic.  Mouth/Throat: Oropharynx is clear and moist and mucous membranes are normal.  Eyes: Pupils are equal, round, and reactive to light. Conjunctivae and EOM are normal.  Cardiovascular: Normal rate and regular rhythm.  No murmur heard. Pulses:      Dorsalis pedis pulses are 2+ on the right side, and 2+ on the left side.  Respiratory: Effort normal and breath sounds normal. No respiratory distress. She exhibits no tenderness.  GI: Soft. She exhibits  no mass. There is no hepatomegaly. There is no tenderness.  Musculoskeletal: She exhibits no edema.  Lymphadenopathy:    She has no cervical adenopathy.  Neurological: She is alert and oriented to person, place, and time. She has normal strength. Coordination normal.  Skin: Skin is warm. No erythema.  Psychiatric: She has a normal mood and affect.  Well groomed, good eye contact.     ASSESSMENT AND PLAN:  Monica [name] was seen today for cough and chest spasm and for follow-up.  Orders Placed This Encounter  Procedures  . DG Chest 2 View  . Microalbumin / creatinine urine ratio  . Comprehensive metabolic panel  . Hemoglobin A1c  . CK    Lab Results  Component Value Date   HGBA1C 7.4 (H) 08/07/2017   Lab Results  Component Value  Date   ALT 14 08/07/2017   AST 13 08/07/2017   ALKPHOS 71 08/07/2017   BILITOT 0.4 08/07/2017   Lab Results  Component Value Date   CREATININE 0.89 08/07/2017   BUN 13 08/07/2017   NA 139 08/07/2017   K 4.2 08/07/2017   CL 102 08/07/2017   CO2 29 08/07/2017   Lab Results  Component Value Date   MICROALBUR 0.8 08/07/2017    Muscle spasm  Chest wall muscle spasms. It seems to be chronic. We discussed possible etiologies: Electrolyte imbalance, medications, and sometimes idiopathic.  After discussion of some side effects, she agrees with trying a muscle relaxant, baclofen 10 mg at bedtime. We will follow labs done today and give recommendations accordingly.  -     CK -     DG Chest 2 View; Future  Persistent cough  We discussed possible etiologies. Because she has had a cough for more than 3 to 4 weeks, imaging was recommended today. Also even though lung auscultation is negative, recommend oral antibiotic. We discussed some side effects of antibiotics. Lisinopril discontinued. Flovent inhaler added. She is not having GERD symptoms, so for now she will continue PPI. She has TB screening done every 6 months through her  employer. Further recommendations will be given according to imaging results.  -     DG Chest 2 View; Future -     azithromycin (ZITHROMAX) 250 MG tablet; 2 tabs day one then 1 tab daily for 4 days.    GERD (gastroesophageal reflux disease) This problem could also be contributing to her persistent cough. It seems to be well controlled, so no changes in omeprazole 40 mg. Continue GERD precautions.    Hypertension, essential, benign Well-controlled. Because persistent cough, which could be aggravated by lisinopril, she agrees with changing lisinopril to losartan. Losartan-HCTZ  50-12.5 mg daily. Continue low-salt diet. Follow-up in 3 weeks, before if needed.  Diabetes mellitus type II, uncontrolled ([place]) No changes in current management. We will follow hemoglobin A1c and adjust medication accordingly. Foot care and annual eye exam. Follow-up in 4 to 6 months, depending of lab results.  Asthma, intermittent, uncomplicated It is not well controlled at this time. Flovent 220 mcg twice daily added today. Continue albuterol inhaler 2 puffs every 4-6 hours as needed. No changes since Singulair 10 mg. Follow-up in 8 weeks, before if needed.      Return in about 2 months (around 10/07/2017) for Asthma, HTN.        Carney Saxton G. Martinique, MD  [facility]. [place] office.

## 2017-08-07 NOTE — Assessment & Plan Note (Signed)
It is not well controlled at this time. Flovent 220 mcg twice daily added today. Continue albuterol inhaler 2 puffs every 4-6 hours as needed. No changes since Singulair 10 mg. Follow-up in 8 weeks, before if needed.

## 2017-08-07 NOTE — Assessment & Plan Note (Signed)
This problem could also be contributing to her persistent cough. It seems to be well controlled, so no changes in omeprazole 40 mg. Continue GERD precautions.

## 2017-08-07 NOTE — Patient Instructions (Addendum)
A few things to remember from today's visit:   Uncontrolled type 2 diabetes mellitus with hyperglycemia ([place]) - Plan: Microalbumin / creatinine urine ratio, Comprehensive metabolic panel, Hemoglobin A1c  Hypertension, essential, benign - Plan: losartan-hydrochlorothiazide (HYZAAR) 50-12.5 MG tablet  Chest pain, unspecified type  Asthma, intermittent, uncomplicated - Plan: fluticasone (FLOVENT HFA) 220 MCG/ACT inhaler  Muscle spasm - Plan: CK, DG Chest 2 View  Persistent cough - Plan: DG Chest 2 View, azithromycin (ZITHROMAX) 250 MG tablet  Today X ray was ordered.  This can be done at [facility] between 8 am and 5 pm: [place]. [phone].  Stop Lisinopril HCTZ and start Losartan HCTZ   Please be sure medication list is accurate. If a new problem present, please set up appointment sooner than planned today.

## 2017-08-07 NOTE — Assessment & Plan Note (Signed)
Well-controlled. Because persistent cough, which could be aggravated by lisinopril, she agrees with changing lisinopril to losartan. Losartan-HCTZ  50-12.5 mg daily. Continue low-salt diet. Follow-up in 3 weeks, before if needed.

## 2017-08-07 NOTE — Assessment & Plan Note (Signed)
No changes in current management. We will follow hemoglobin A1c and adjust medication accordingly. Foot care and annual eye exam. Follow-up in 4 to 6 months, depending of lab results.

## 2017-08-08 ENCOUNTER — Ambulatory Visit (INDEPENDENT_AMBULATORY_CARE_PROVIDER_SITE_OTHER)
Admission: RE | Admit: 2017-08-08 | Discharge: 2017-08-08 | Disposition: A | Discharge status: Home / Self Care | Payer: BC Managed Care – PPO | Source: Ambulatory Visit | Attending: Family Medicine | Admitting: Family Medicine

## 2017-08-08 DIAGNOSIS — M62838 Other muscle spasm: Secondary | ICD-10-CM | POA: Diagnosis not present

## 2017-08-08 DIAGNOSIS — R05 Cough: Secondary | ICD-10-CM

## 2017-08-08 DIAGNOSIS — R053 Chronic cough: Secondary | ICD-10-CM

## 2017-08-10 ENCOUNTER — Encounter: Payer: Self-pay | Admitting: Family Medicine

## 2017-09-08 ENCOUNTER — Other Ambulatory Visit: Payer: Self-pay | Admitting: Family Medicine

## 2017-09-08 DIAGNOSIS — R05 Cough: Secondary | ICD-10-CM

## 2017-09-08 DIAGNOSIS — K219 Gastro-esophageal reflux disease without esophagitis: Secondary | ICD-10-CM

## 2017-09-08 DIAGNOSIS — J309 Allergic rhinitis, unspecified: Secondary | ICD-10-CM

## 2017-09-08 DIAGNOSIS — M62838 Other muscle spasm: Secondary | ICD-10-CM

## 2017-09-08 DIAGNOSIS — R053 Chronic cough: Secondary | ICD-10-CM

## 2017-10-07 ENCOUNTER — Other Ambulatory Visit: Payer: Self-pay | Admitting: Family Medicine

## 2017-10-07 DIAGNOSIS — K219 Gastro-esophageal reflux disease without esophagitis: Secondary | ICD-10-CM

## 2017-10-07 DIAGNOSIS — E1165 Type 2 diabetes mellitus with hyperglycemia: Secondary | ICD-10-CM

## 2017-10-07 DIAGNOSIS — I10 Essential (primary) hypertension: Secondary | ICD-10-CM

## 2017-10-14 ENCOUNTER — Encounter: Payer: Self-pay | Admitting: Family Medicine

## 2017-10-14 ENCOUNTER — Ambulatory Visit: Payer: BC Managed Care – PPO | Admitting: Family Medicine

## 2017-10-14 VITALS — BP 130/92 | HR 74 | Resp 12 | Ht 74.0 in | Wt 302.0 lb

## 2017-10-14 DIAGNOSIS — M5410 Radiculopathy, site unspecified: Secondary | ICD-10-CM

## 2017-10-14 DIAGNOSIS — M25562 Pain in left knee: Secondary | ICD-10-CM

## 2017-10-14 DIAGNOSIS — I10 Essential (primary) hypertension: Secondary | ICD-10-CM

## 2017-10-14 DIAGNOSIS — M5441 Lumbago with sciatica, right side: Secondary | ICD-10-CM | POA: Diagnosis not present

## 2017-10-14 MED ORDER — PREDNISONE 20 MG PO TABS: ORAL_TABLET | ORAL | 0 refills | Status: AC

## 2017-10-14 MED ORDER — KETOROLAC TROMETHAMINE 60 MG/2ML IM SOLN
60.0000 mg | Freq: Once | INTRAMUSCULAR | Status: AC
  Administered 2017-10-14: 60 mg via INTRAMUSCULAR

## 2017-10-14 MED ORDER — TRAMADOL HCL 50 MG PO TABS: 50.0000 mg | ORAL_TABLET | Freq: Two times a day (BID) | ORAL | 0 refills | Status: AC | PRN

## 2017-10-14 NOTE — Patient Instructions (Addendum)
A few things to remember from today's visit:   Acute bilateral low back pain with right-sided sciatica - Plan: traMADol (ULTRAM) 50 MG tablet, ketorolac (TORADOL) injection 60 mg  Left knee pain, unspecified chronicity - Plan: ketorolac (TORADOL) injection 60 mg  Radicular pain of left lower extremity - Plan: predniSONE (DELTASONE) 20 MG tablet  Tramadol to take mainly at night. Take prednisone in the morning with food. Here today you received Toradol injection.  Closely monitor blood sugars.  Please be sure medication list is accurate. If a new problem present, please set up appointment sooner than planned today.

## 2017-10-14 NOTE — Progress Notes (Signed)
ACUTE VISIT   HPI:  Chief Complaint  Patient presents with  . Back Pain    Monica [name] is a 42 y.o. female, who is here today complaining of 2 weeks of bilateral constant lower back pain.   Initially she thought her back was "irritated" due to lifting related to work.  She denies any recent injury or unusual level of activity.  She feels like pain is getting worse. Pain is interfering with sleep.  Pain is radiated, sharp/shooting like, 9/10 in intensity with movement and 6/10 at rest. She has noted some numbness on fourth and fifth toes, bilateral. Negative for associated urinary incontinence or retention, stool incontinence, or saddle anesthesia.  Exacerbated by movement, prolonged standing, and walking.  Alleviated some by by rest.  No rash or edema on area, fever, chills, or abnormal wt loss.  Prior Hx of back pain: "Not like this."  Around 5 years ago she had an MRI done and her back was "messed up.".  She had received epidural injections.  She is also having left knee pain, she is not sure if this is related to lower back pain. No history of new trauma. No edema or erythema. Pain is also constant, aggravated by walking and alleviated by rest.   OTC medications: none.   Local ice. She took hydrocodone she had left from prior prescription but it did not help at all.  In the past she has taken tramadol.   BP today slightly elevated. History of hypertension, currently she is on losartan--HCTZ 50-12.5 mg daily and amlodipine 2.5 mg daily. Negative for headache, visual changes, chest pain, or dyspnea.  Review of Systems  Constitutional: Positive for activity change. Negative for appetite change, fatigue and fever.  Eyes: Negative for redness and visual disturbance.  Respiratory: Negative for shortness of breath and wheezing.   Cardiovascular: Negative for chest pain and leg swelling.  Gastrointestinal: Negative for abdominal pain, nausea and vomiting.        Negative for changes in bowel habits.  Genitourinary: Negative for decreased urine volume, dysuria, hematuria, vaginal bleeding and vaginal discharge.  Musculoskeletal: Positive for arthralgias and back pain. Negative for joint swelling.  Skin: Negative for rash.  Neurological: Positive for numbness. Negative for weakness and headaches.  Hematological: Negative for adenopathy.  Psychiatric/Behavioral: Positive for sleep disturbance. The patient is not nervous/anxious.       Current Outpatient Medications on File Prior to Visit  Medication Sig Dispense Refill  . albuterol (PROVENTIL HFA;VENTOLIN HFA) 108 (90 Base) MCG/ACT inhaler INHALE 2 PUFFS INTO THE LUNGS EVERY 6 HOURS AS NEEDED FOR WHEEZING OR SHORTNESS OF BREATH 8.5 g 0  . amLODipine (NORVASC) 2.5 MG tablet TAKE 1 TABLET(2.5 MG) BY MOUTH DAILY 90 tablet 0  . baclofen (LIORESAL) 10 MG tablet TAKE 1 TABLET(10 MG) BY MOUTH AT BEDTIME AS NEEDED FOR MUSCLE SPASMS 30 tablet 0  . fluticasone (FLONASE) 50 MCG/ACT nasal spray SHAKE LIQUID AND USE 1 SPRAY IN EACH NOSTRIL TWICE DAILY 16 g 1  . fluticasone (FLOVENT HFA) 220 MCG/ACT inhaler Inhale 2 puffs into the lungs 2 (two) times daily. 1 Inhaler 3  . glucose blood test strip Use to test blood sugar once daily. One Touch Verio Test Strips. 100 each 1  . losartan-hydrochlorothiazide (HYZAAR) 50-12.5 MG tablet Take 1 tablet by mouth daily. 90 tablet 1  . montelukast (SINGULAIR) 10 MG tablet TAKE 1 TABLET BY MOUTH AT BEDTIME 90 tablet 0  . omeprazole (PRILOSEC) 40 MG  capsule TAKE 1 CAPSULE(40 MG) BY MOUTH DAILY 30 capsule 0  . ONETOUCH DELICA LANCETS 76H MISC Use to test blood sugar once daily. 100 each 1  . ranitidine (ZANTAC) 150 MG tablet Take 150 mg by mouth as needed.     Marland Kitchen SYNJARDY XR 25-1000 MG TB24 TAKE 1 TABLET BY MOUTH DAILY 30 tablet 0   No current facility-administered medications on file prior to visit.      Past Medical History:  Diagnosis Date  . Diabetes mellitus  without complication ([place]) 60/7371  . Hypertension   . OSA (obstructive sleep apnea)    Allergies  Allergen Reactions  . Codeine     Unable to Urinate     Social History   Socioeconomic History  . Marital status: Married    Spouse name: Not on file  . Number of children: 1  . Years of education: Not on file  . Highest education level: Not on file  Occupational History  . Occupation: Software engineer  Social Needs  . Financial resource strain: Not on file  . Food insecurity:    Worry: Not on file    Inability: Not on file  . Transportation needs:    Medical: Not on file    Non-medical: Not on file  Tobacco Use  . Smoking status: Current Some Day Smoker    Packs/day: 0.50  . Smokeless tobacco: Never Used  . Tobacco comment: down to 3 cigarettes per day  Substance and Sexual Activity  . Alcohol use: Yes    Comment: socially  . Drug use: No  . Sexual activity: Not on file  Lifestyle  . Physical activity:    Days per week: Not on file    Minutes per session: Not on file  . Stress: Not on file  Relationships  . Social connections:    Talks on phone: Not on file    Gets together: Not on file    Attends religious service: Not on file    Active member of club or organization: Not on file    Attends meetings of clubs or organizations: Not on file    Relationship status: Not on file  Other Topics Concern  . Not on file  Social History Narrative  . Not on file    Vitals:   10/14/17 0737  BP: (!) 130/92  Pulse: 74  Resp: 12  SpO2: 99%   Body mass index is 38.77 kg/m.   Physical Exam  Nursing note and vitals reviewed. Constitutional: She is oriented to person, place, and time. She appears well-developed. She does not appear ill. She appears distressed (Due to pain.).  HENT:  Head: Normocephalic and atraumatic.  Eyes: Conjunctivae are normal.  Cardiovascular: Normal rate and regular rhythm.  Pulses:      Posterior tibial pulses are 2+ on the  right side, and 2+ on the left side.  Respiratory: Effort normal and breath sounds normal. No respiratory distress.  GI: Soft. She exhibits no mass. There is no hepatomegaly. There is no tenderness.  Musculoskeletal: She exhibits no edema.       Left knee: She exhibits normal range of motion and no erythema.       Lumbar back: She exhibits no tenderness and no bony tenderness.  Pain elicited with movement on exam table during examination. No local edema or erythema appreciated, no suspicious lesions.  Left knee pain upon flexion and extension, moderate crepitus. No edema or erythema.   Neurological: She is alert and  oriented to person, place, and time. She has normal strength.  SLR elicits pain LLE, negative RLE. Knee DTRs 1+-2+, symmetric. She can walk on tiptoes and heels with no limitations or pain.  Skin: Skin is warm. No rash noted. No erythema.  Psychiatric: She has a normal mood and affect.  Well groomed, good eye contact.    ASSESSMENT AND PLAN:   Monica [name] was seen today for back pain.  Diagnoses and all orders for this visit:  Acute bilateral low back pain with right-sided sciatica  Acute on chronic lower back pain. In the past she has taken tramadol, it has been well-tolerated.  Recommend tramadol 50 mg twice daily as needed, we discussed side effects. Because no history of trauma, I do not think back imaging is needed today. Here in the office after verbal consent she received Toradol 60 mg IM. Relative rest. Instructed about warning signs.  -     traMADol (ULTRAM) 50 MG tablet; Take 1 tablet (50 mg total) by mouth every 12 (twelve) hours as needed for up to 7 days. -     ketorolac (TORADOL) injection 60 mg  Left knee pain, unspecified chronicity  ?  OA. Tramadol may help. She can also take acetaminophen 500 mg 3 times daily as needed. Follow-up as needed.  -     ketorolac (TORADOL) injection 60 mg  Radicular pain of left lower extremity  We discussed  prognosis and current recommendations in regard to steroids. After discussing some of the side effects of prednisone, including aggravating DM2, she agrees with prednisone taper. Recommend taking medication with food. She will monitor BS closely.  If symptom is persistent and not resolve in 4 to 6 weeks, or if it is worse we will need lumbar MRI and/or Ortho evaluation. Instructed about warning signs. She agrees with plan and voices understanding.  -     predniSONE (DELTASONE) 20 MG tablet; 3 tabs for 3 days, 2 tabs for 3 days, 1 tabs for 3 days, and 1/2 tab for 3 days. Take tables together with breakfast.  Hypertension, essential, benign BP mildly elevated today. No changes in current management for now. She will continue monitoring BP.     Return if symptoms worsen or fail to improve.     Betty G. Martinique, MD  [facility]. [place] office.

## 2017-10-14 NOTE — Assessment & Plan Note (Signed)
BP mildly elevated today. No changes in current management for now. She will continue monitoring BP.

## 2017-11-07 ENCOUNTER — Other Ambulatory Visit: Payer: Self-pay | Admitting: Family Medicine

## 2017-11-07 DIAGNOSIS — E1165 Type 2 diabetes mellitus with hyperglycemia: Secondary | ICD-10-CM

## 2017-11-07 DIAGNOSIS — K219 Gastro-esophageal reflux disease without esophagitis: Secondary | ICD-10-CM

## 2017-11-25 ENCOUNTER — Other Ambulatory Visit: Payer: Self-pay | Admitting: Family Medicine

## 2017-11-25 ENCOUNTER — Encounter: Payer: Self-pay | Admitting: *Deleted

## 2017-11-25 ENCOUNTER — Encounter: Payer: Self-pay | Admitting: Family Medicine

## 2017-11-25 ENCOUNTER — Ambulatory Visit: Payer: BC Managed Care – PPO | Admitting: Family Medicine

## 2017-11-25 VITALS — BP 130/84 | HR 100 | Temp 98.8°F | Resp 16 | Ht 74.0 in | Wt 303.4 lb

## 2017-11-25 DIAGNOSIS — R05 Cough: Secondary | ICD-10-CM

## 2017-11-25 DIAGNOSIS — R053 Chronic cough: Secondary | ICD-10-CM

## 2017-11-25 DIAGNOSIS — J309 Allergic rhinitis, unspecified: Secondary | ICD-10-CM | POA: Diagnosis not present

## 2017-11-25 DIAGNOSIS — J069 Acute upper respiratory infection, unspecified: Secondary | ICD-10-CM | POA: Diagnosis not present

## 2017-11-25 DIAGNOSIS — Z23 Encounter for immunization: Secondary | ICD-10-CM

## 2017-11-25 DIAGNOSIS — J4520 Mild intermittent asthma, uncomplicated: Secondary | ICD-10-CM

## 2017-11-25 DIAGNOSIS — R059 Cough, unspecified: Secondary | ICD-10-CM

## 2017-11-25 MED ORDER — HYDROCODONE-HOMATROPINE 5-1.5 MG/5ML PO SYRP: 5.0000 mL | ORAL_SOLUTION | Freq: Two times a day (BID) | ORAL | 0 refills | Status: AC | PRN

## 2017-11-25 MED ORDER — FLUTICASONE-SALMETEROL 250-50 MCG/DOSE IN AEPB: 1.0000 | INHALATION_SPRAY | Freq: Two times a day (BID) | RESPIRATORY_TRACT | 3 refills | Status: DC

## 2017-11-25 MED ORDER — BENZONATATE 100 MG PO CAPS: 200.0000 mg | ORAL_CAPSULE | Freq: Two times a day (BID) | ORAL | 0 refills | Status: AC | PRN

## 2017-11-25 NOTE — Assessment & Plan Note (Signed)
Problem is not well controlled. Flovent discontinued. Advair 250-50 mcg daily started. Albuterol inh 2 puff every 4 hours for a week then as needed for wheezing or shortness of breath.  No changes in Singulair. Smoking cessation recommended. F/U in 4 month,before if needed.

## 2017-11-25 NOTE — Progress Notes (Signed)
ACUTE VISIT   HPI:  Chief Complaint  Patient presents with  . Cough    sx started Friday, with a lot of mucus    Monica [name] is a 42 y.o. female, who is here today complaining of 2 to 3 days of respiratory symptoms. Productive cough with yellowish sputum, she denies hemoptysis. She has not noted fever but has had some chills.  Mild dysphonia. Cough seems to be worse at night, keeping her from sleep.  She has a prescription left for cough syrup she received in 07/2017, this has helped with sleep at night. 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  Allergic rhinitis: Currently she is on Singulair 10 mg and Flonase nasal spray. Nasal congestion,rhinorrhea,and post nasal drainage.  Asthma: Last time she used Albuterol was this morning.  She is using albuterol once daily, twice daily since she has been sick. + Wheezing. Denies SOB. Currently she is on Flovent 220 mcg twice daily and Singulair 10 mg daily.  She uses Albuterol inh daily when she is not sick.     Review of Systems  Constitutional: Positive for activity change, appetite change, chills and fatigue. Negative for fever.  HENT: Positive for congestion, ear pain, postnasal drip, rhinorrhea, sinus pressure and voice change. Negative for facial swelling, mouth sores, sore throat and trouble swallowing.   Eyes: Negative for discharge, redness and itching.  Respiratory: Positive for cough and wheezing. Negative for shortness of breath.   Cardiovascular: Negative for chest pain and leg swelling.  Gastrointestinal: Negative for abdominal pain, diarrhea, nausea and vomiting.  Musculoskeletal: Positive for myalgias. Negative for joint swelling.  Skin: Negative for rash.  Allergic/Immunologic: Positive for environmental allergies.  Neurological: Negative for syncope, weakness and headaches.  Hematological: Negative for adenopathy. Does not bruise/bleed easily.    Psychiatric/Behavioral: Positive for sleep disturbance. Negative for confusion.      Current Outpatient Medications on File Prior to Visit  Medication Sig Dispense Refill  . amLODipine (NORVASC) 2.5 MG tablet TAKE 1 TABLET(2.5 MG) BY MOUTH DAILY 90 tablet 0  . baclofen (LIORESAL) 10 MG tablet TAKE 1 TABLET(10 MG) BY MOUTH AT BEDTIME AS NEEDED FOR MUSCLE SPASMS 30 tablet 0  . fluticasone (FLONASE) 50 MCG/ACT nasal spray SHAKE LIQUID AND USE 1 SPRAY IN EACH NOSTRIL TWICE DAILY 16 g 1  . glucose blood test strip Use to test blood sugar once daily. One Touch Verio Test Strips. 100 each 1  . losartan-hydrochlorothiazide (HYZAAR) 50-12.5 MG tablet Take 1 tablet by mouth daily. 90 tablet 1  . montelukast (SINGULAIR) 10 MG tablet TAKE 1 TABLET BY MOUTH AT BEDTIME 90 tablet 0  . omeprazole (PRILOSEC) 40 MG capsule TAKE 1 CAPSULE(40 MG) BY MOUTH DAILY 30 capsule 0  . ONETOUCH DELICA LANCETS 62E MISC Use to test blood sugar once daily. 100 each 1  . ranitidine (ZANTAC) 150 MG tablet Take 150 mg by mouth as needed.     Marland Kitchen SYNJARDY XR 25-1000 MG TB24 TAKE 1 TABLET BY MOUTH DAILY 30 tablet 0   No current facility-administered medications on file prior to visit.      Past Medical History:  Diagnosis Date  . Diabetes mellitus without complication ([place]) 36/6294  . Hypertension   . OSA (obstructive sleep apnea)    Allergies  Allergen Reactions  . Codeine     Unable to Urinate     Social History   Socioeconomic History  .  Marital status: Married    Spouse name: Not on file  . Number of children: 1  . Years of education: Not on file  . Highest education level: Not on file  Occupational History  . Occupation: Software engineer  Social Needs  . Financial resource strain: Not on file  . Food insecurity:    Worry: Not on file    Inability: Not on file  . Transportation needs:    Medical: Not on file    Non-medical: Not on file  Tobacco Use  . Smoking status: Current Some Day  Smoker    Packs/day: 0.50  . Smokeless tobacco: Never Used  . Tobacco comment: down to 3 cigarettes per day  Substance and Sexual Activity  . Alcohol use: Yes    Comment: socially  . Drug use: No  . Sexual activity: Not on file  Lifestyle  . Physical activity:    Days per week: Not on file    Minutes per session: Not on file  . Stress: Not on file  Relationships  . Social connections:    Talks on phone: Not on file    Gets together: Not on file    Attends religious service: Not on file    Active member of club or organization: Not on file    Attends meetings of clubs or organizations: Not on file    Relationship status: Not on file  Other Topics Concern  . Not on file  Social History Narrative  . Not on file    Vitals:   11/25/17 1016  BP: 130/84  Pulse: 100  Resp: 16  Temp: 98.8 F (37.1 C)  SpO2: 98%   Body mass index is 38.95 kg/m.   Wt Readings from Last 3 Encounters:  11/25/17 (!) 303 lb 6 oz (137.6 kg)  10/14/17 (!) 302 lb (137 kg)  08/07/17 (!) 302 lb (137 kg)      Physical Exam  Nursing note and vitals reviewed. Constitutional: She is oriented to person, place, and time. She appears well-developed. She does not appear ill. No distress.  HENT:  Head: Atraumatic.  Right Ear: Tympanic membrane, external ear and ear canal normal.  Left Ear: Tympanic membrane, external ear and ear canal normal.  Nose: Rhinorrhea present. Right sinus exhibits no maxillary sinus tenderness and no frontal sinus tenderness. Left sinus exhibits no maxillary sinus tenderness and no frontal sinus tenderness.  Mouth/Throat: Oropharynx is clear and moist and mucous membranes are normal.  Hypertrophic turbinates. Postnasal drainage. Nasal voice.  Eyes: Conjunctivae are normal.  Neck: No muscular tenderness present. No edema and no erythema present.  Cardiovascular: Normal rate and regular rhythm.  No murmur heard. Respiratory: Effort normal and breath sounds normal. No  stridor. No respiratory distress.  GI: Soft. She exhibits no mass. There is no tenderness.  Lymphadenopathy:       Head (right side): No submandibular adenopathy present.       Head (left side): No submandibular adenopathy present.    She has no cervical adenopathy.  Neurological: She is alert and oriented to person, place, and time. She has normal strength.  Skin: Skin is warm. No rash noted. No erythema.  Psychiatric: She has a normal mood and affect. Her speech is normal.  Well groomed, good eye contact.      ASSESSMENT AND PLAN:  Monica [name] was seen today for cough.  Diagnoses and all orders for this visit:  URI, acute Symptoms suggests a viral etiology, symptomatic treatment recommended.  Instructed to monitor for signs of complications, including new onset of fever among some, clearly instructed about warning signs. I also explained that cough and nasal congestion can last a few days and sometimes weeks. F/U as needed.  Asthma, intermittent, uncomplicated Problem is not well controlled. Flovent discontinued. Advair 250-50 mcg daily started. Albuterol inh 2 puff every 4 hours for a week then as needed for wheezing or shortness of breath.  No changes in Singulair. Smoking cessation recommended. F/U in 4 month,before if needed.   Allergic rhinitis Continue Flonase nasal spray. Continue Singulair 10 mg daily. Saline irrigations with saline several times per day and as needed. Follow-up as needed.   Cough Side effects of Hycodan discussed. Adequate hydration. Explained that given the fact she smokes,cough can last weeks or months.  -     benzonatate (TESSALON) 100 MG capsule; Take 2 capsules (200 mg total) by mouth 2 (two) times daily as needed for up to 10 days. -     HYDROcodone-homatropine (HYCODAN) 5-1.5 MG/5ML syrup; Take 5 mLs by mouth every 12 (twelve) hours as needed for up to 10 days for cough.    Need for immunization against influenza -     Flu Vaccine  QUAD 36+ mos IM     Return in about 4 months (around 03/28/2018) for Dm II,HTN,wt.      Monica G. Martinique, MD  [facility]. [place] office.

## 2017-11-25 NOTE — Assessment & Plan Note (Signed)
Continue Flonase nasal spray. Continue Singulair 10 mg daily. Saline irrigations with saline several times per day and as needed. Follow-up as needed.

## 2017-11-25 NOTE — Patient Instructions (Addendum)
A few things to remember from today's visit:   URI, acute  Asthma, intermittent, uncomplicated - Plan: Fluticasone-Salmeterol (ADVAIR DISKUS) 250-50 MCG/DOSE AEPB  Cough - Plan: benzonatate (TESSALON) 100 MG capsule, HYDROcodone-homatropine (HYCODAN) 5-1.5 MG/5ML syrup  Flovent discontinue. Start after 1 pull twice daily. Albuterol inh 2 puff every 4 hours for a week then as needed for wheezing or shortness of breath.   Continue working on smoking cessation. Continue Singulair. Continue Flonase nasal spray. Nasal saline irrigations several times per day.  viral infections are self-limited and we treat each symptom depending of severity.  Over the counter medications as decongestants and cold medications usually help, they need to be taken with caution if there is a history of high blood pressure or palpitations. Tylenol and/or Ibuprofen also helps with most symptoms (headache, muscle aching, fever,etc) Plenty of fluids. Honey helps with cough. Steam inhalations helps with runny nose, nasal congestion, and may prevent sinus infections. Cough and nasal congestion could last a few days and sometimes weeks. Please follow in not any better in 1-2 weeks or if symptoms get worse.   Please be sure medication list is accurate. If a new problem present, please set up appointment sooner than planned today.

## 2017-11-27 ENCOUNTER — Encounter: Payer: Self-pay | Admitting: *Deleted

## 2017-11-27 ENCOUNTER — Encounter: Payer: Self-pay | Admitting: Family Medicine

## 2017-11-28 ENCOUNTER — Encounter: Payer: Self-pay | Admitting: Family Medicine

## 2017-12-07 ENCOUNTER — Other Ambulatory Visit: Payer: Self-pay | Admitting: Family Medicine

## 2017-12-07 DIAGNOSIS — J309 Allergic rhinitis, unspecified: Secondary | ICD-10-CM

## 2017-12-07 DIAGNOSIS — E1165 Type 2 diabetes mellitus with hyperglycemia: Secondary | ICD-10-CM

## 2017-12-07 DIAGNOSIS — K219 Gastro-esophageal reflux disease without esophagitis: Secondary | ICD-10-CM

## 2018-01-04 ENCOUNTER — Other Ambulatory Visit: Payer: Self-pay | Admitting: Family Medicine

## 2018-01-04 DIAGNOSIS — R05 Cough: Secondary | ICD-10-CM

## 2018-01-04 DIAGNOSIS — R053 Chronic cough: Secondary | ICD-10-CM

## 2018-01-13 ENCOUNTER — Other Ambulatory Visit: Payer: Self-pay | Admitting: Family Medicine

## 2018-01-13 DIAGNOSIS — I10 Essential (primary) hypertension: Secondary | ICD-10-CM

## 2018-01-13 DIAGNOSIS — E1165 Type 2 diabetes mellitus with hyperglycemia: Secondary | ICD-10-CM

## 2018-01-13 DIAGNOSIS — K219 Gastro-esophageal reflux disease without esophagitis: Secondary | ICD-10-CM

## 2018-02-12 ENCOUNTER — Other Ambulatory Visit: Payer: Self-pay | Admitting: Family Medicine

## 2018-02-12 DIAGNOSIS — R05 Cough: Secondary | ICD-10-CM

## 2018-02-12 DIAGNOSIS — R053 Chronic cough: Secondary | ICD-10-CM

## 2018-02-12 DIAGNOSIS — K219 Gastro-esophageal reflux disease without esophagitis: Secondary | ICD-10-CM

## 2018-02-12 DIAGNOSIS — I10 Essential (primary) hypertension: Secondary | ICD-10-CM

## 2018-02-15 ENCOUNTER — Other Ambulatory Visit: Payer: Self-pay | Admitting: Family Medicine

## 2018-02-15 DIAGNOSIS — R0981 Nasal congestion: Secondary | ICD-10-CM

## 2018-02-15 DIAGNOSIS — E1165 Type 2 diabetes mellitus with hyperglycemia: Secondary | ICD-10-CM

## 2018-02-18 DIAGNOSIS — M25561 Pain in right knee: Secondary | ICD-10-CM | POA: Insufficient documentation

## 2018-02-26 ENCOUNTER — Other Ambulatory Visit: Payer: Self-pay | Admitting: Family Medicine

## 2018-02-26 DIAGNOSIS — M62838 Other muscle spasm: Secondary | ICD-10-CM

## 2018-03-20 ENCOUNTER — Other Ambulatory Visit: Payer: Self-pay | Admitting: Family Medicine

## 2018-03-20 DIAGNOSIS — J309 Allergic rhinitis, unspecified: Secondary | ICD-10-CM

## 2018-03-20 DIAGNOSIS — K219 Gastro-esophageal reflux disease without esophagitis: Secondary | ICD-10-CM

## 2018-03-20 DIAGNOSIS — E1165 Type 2 diabetes mellitus with hyperglycemia: Secondary | ICD-10-CM

## 2018-04-02 DIAGNOSIS — S83241A Other tear of medial meniscus, current injury, right knee, initial encounter: Secondary | ICD-10-CM | POA: Insufficient documentation

## 2018-04-19 ENCOUNTER — Other Ambulatory Visit: Payer: Self-pay | Admitting: Family Medicine

## 2018-04-19 DIAGNOSIS — I10 Essential (primary) hypertension: Secondary | ICD-10-CM

## 2018-04-19 DIAGNOSIS — K219 Gastro-esophageal reflux disease without esophagitis: Secondary | ICD-10-CM

## 2018-04-19 DIAGNOSIS — E1165 Type 2 diabetes mellitus with hyperglycemia: Secondary | ICD-10-CM

## 2018-05-09 ENCOUNTER — Ambulatory Visit: Payer: BC Managed Care – PPO | Admitting: Family Medicine

## 2018-05-17 ENCOUNTER — Other Ambulatory Visit: Payer: Self-pay | Admitting: Family Medicine

## 2018-05-17 DIAGNOSIS — R053 Chronic cough: Secondary | ICD-10-CM

## 2018-05-17 DIAGNOSIS — R05 Cough: Secondary | ICD-10-CM

## 2018-05-17 DIAGNOSIS — K219 Gastro-esophageal reflux disease without esophagitis: Secondary | ICD-10-CM

## 2018-05-17 DIAGNOSIS — E1165 Type 2 diabetes mellitus with hyperglycemia: Secondary | ICD-10-CM

## 2018-05-29 ENCOUNTER — Encounter: Payer: Self-pay | Admitting: Family Medicine

## 2018-05-29 ENCOUNTER — Other Ambulatory Visit: Payer: Self-pay | Admitting: *Deleted

## 2018-05-29 DIAGNOSIS — I10 Essential (primary) hypertension: Secondary | ICD-10-CM

## 2018-05-29 MED ORDER — LOSARTAN POTASSIUM-HCTZ 50-12.5 MG PO TABS: 1.0000 | ORAL_TABLET | Freq: Every day | ORAL | 1 refills | Status: DC

## 2018-06-03 ENCOUNTER — Other Ambulatory Visit: Payer: Self-pay | Admitting: Family Medicine

## 2018-06-03 DIAGNOSIS — J4520 Mild intermittent asthma, uncomplicated: Secondary | ICD-10-CM

## 2018-06-06 ENCOUNTER — Ambulatory Visit (INDEPENDENT_AMBULATORY_CARE_PROVIDER_SITE_OTHER): Payer: BC Managed Care – PPO | Admitting: Family Medicine

## 2018-06-06 ENCOUNTER — Other Ambulatory Visit: Payer: Self-pay | Admitting: Family Medicine

## 2018-06-06 ENCOUNTER — Other Ambulatory Visit: Payer: Self-pay

## 2018-06-06 ENCOUNTER — Encounter: Payer: Self-pay | Admitting: Family Medicine

## 2018-06-06 VITALS — BP 141/92 | HR 89 | Resp 12

## 2018-06-06 DIAGNOSIS — K219 Gastro-esophageal reflux disease without esophagitis: Secondary | ICD-10-CM

## 2018-06-06 DIAGNOSIS — E1169 Type 2 diabetes mellitus with other specified complication: Secondary | ICD-10-CM

## 2018-06-06 DIAGNOSIS — I10 Essential (primary) hypertension: Secondary | ICD-10-CM

## 2018-06-06 DIAGNOSIS — J309 Allergic rhinitis, unspecified: Secondary | ICD-10-CM | POA: Diagnosis not present

## 2018-06-06 DIAGNOSIS — E785 Hyperlipidemia, unspecified: Secondary | ICD-10-CM

## 2018-06-06 MED ORDER — HYDROCHLOROTHIAZIDE 12.5 MG PO TABS: 12.5000 mg | ORAL_TABLET | Freq: Every day | ORAL | 2 refills | Status: DC

## 2018-06-06 MED ORDER — LOSARTAN POTASSIUM 50 MG PO TABS: 50.0000 mg | ORAL_TABLET | Freq: Every day | ORAL | 2 refills | Status: DC

## 2018-06-06 MED ORDER — AMLODIPINE BESYLATE 5 MG PO TABS: 5.0000 mg | ORAL_TABLET | Freq: Every day | ORAL | 1 refills | Status: DC

## 2018-06-06 MED ORDER — EMPAGLIFLOZIN-METFORMIN HCL ER 25-1000 MG PO TB24: 1.0000 | ORAL_TABLET | Freq: Every day | ORAL | 1 refills | Status: DC

## 2018-06-06 NOTE — Progress Notes (Signed)
Virtual Visit via Video Note   I connected with Monica [name] on 06/08/18 at  3:15 PM EDT by a video enabled telemedicine application and verified that I am speaking with the correct person using two identifiers.  Location patient: home Location provider:work office Persons participating in the virtual visit: patient, provider  I discussed the limitations of evaluation and management by telemedicine and the availability of in person appointments. She expressed understanding and agreed to proceed.   HPI: Monica [name] is being seen today for chronic disease management. Last F/U visit 07/2017.  Hypertension:  On Losartan-HCTZ 50-12.5 mg and Amlodipine 2.5 mg daily.  She is checking BP periodically, 120's/90's. She has not taking Hyzaar for about 10 days. No side effects reported.  She has not noted unusual headache, visual changes, exertional chest pain, dyspnea,  focal weakness, or edema.   Lab Results  Component Value Date   CREATININE 0.89 08/07/2017   BUN 13 08/07/2017   NA 139 08/07/2017   K 4.2 08/07/2017   CL 102 08/07/2017   CO2 29 08/07/2017   DM 2: She is on Empaglifozin-Metformin 25-1000 mg daily. Denies abdominal pain, nausea,vomiting, polydipsia,polyuria, or polyphagia.  BS's "good", usually < 180, occasionally 200's depending of what she eats the night before. No feet numbness or burning sensation.  Lab Results  Component Value Date   HGBA1C 7.4 (H) 08/07/2017    Lab Results  Component Value Date   MICROALBUR 0.8 08/07/2017   She also mentions having nasal congestion, rhinorrhea, and postnasal drainage usually at night after she goes out for a walk. Alleviated by using Flonase nasal spray. Hx of asthma, current asymptomatic. Symptoms have improved since she has been working from home but exacerbated when she goes for a walk at night.   Hyperlipidemia: Currently on non pharmacologic treatment. Following a low fat diet: Not consistently.  Lab Results   Component Value Date   CHOL 190 01/14/2017   HDL 53.60 01/14/2017   LDLCALC 116 (H) 01/14/2017   TRIG 101.0 01/14/2017   CHOLHDL 4 01/14/2017   GERD: She is on Omeprazole 40 mg daily. She has tolerated medications well. She has not had chest discomfort since Omeprazole was started.  Denies abdominal pain, nausea, vomiting, changes in bowel habits, blood in stool or melena.   ROS: See pertinent positives and negatives per HPI.  Past Medical History:  Diagnosis Date  . Diabetes mellitus without complication ([place]) 82/9562  . Hypertension   . OSA (obstructive sleep apnea)     Past Surgical History:  Procedure Laterality Date  . ABDOMINAL HYSTERECTOMY  02/19/2006  . FOOT SURGERY     Left  . KNEE ARTHROSCOPY    . ROTATOR CUFF REPAIR Left     Family History  Problem Relation Age of Onset  . Hyperlipidemia Mother   . Stroke Mother   . Heart disease Mother   . Hypertension Mother   . Diabetes Mother   . Hyperlipidemia Father   . Heart disease Father   . Stroke Father   . Hypertension Father   . Diabetes Father   . Colon cancer Father   . Diabetes Brother   . Hypertension Maternal Grandmother   . Diabetes Mellitus II Maternal Grandmother   . Colon cancer Paternal Uncle     Social History   Socioeconomic History  . Marital status: Married    Spouse name: Not on file  . Number of children: 1  . Years of education: Not on file  . Highest  education level: Not on file  Occupational History  . Occupation: Software engineer  Social Needs  . Financial resource strain: Not on file  . Food insecurity:    Worry: Not on file    Inability: Not on file  . Transportation needs:    Medical: Not on file    Non-medical: Not on file  Tobacco Use  . Smoking status: Current Some Day Smoker    Packs/day: 0.50  . Smokeless tobacco: Never Used  . Tobacco comment: down to 3 cigarettes per day  Substance and Sexual Activity  . Alcohol use: Yes    Comment: socially   . Drug use: No  . Sexual activity: Not on file  Lifestyle  . Physical activity:    Days per week: Not on file    Minutes per session: Not on file  . Stress: Not on file  Relationships  . Social connections:    Talks on phone: Not on file    Gets together: Not on file    Attends religious service: Not on file    Active member of club or organization: Not on file    Attends meetings of clubs or organizations: Not on file    Relationship status: Not on file  . Intimate partner violence:    Fear of current or ex partner: Not on file    Emotionally abused: Not on file    Physically abused: Not on file    Forced sexual activity: Not on file  Other Topics Concern  . Not on file  Social History Narrative  . Not on file    Current Outpatient Medications:  .  albuterol (PROVENTIL HFA;VENTOLIN HFA) 108 (90 Base) MCG/ACT inhaler, INHALE 2 PUFFS INTO THE LUNGS EVERY 6 HOURS AS NEEDED FOR WHEEZING OR SHORTNESS OF BREATH, Disp: 8.5 g, Rfl: 0 .  amLODipine (NORVASC) 5 MG tablet, Take 1 tablet (5 mg total) by mouth daily., Disp: 90 tablet, Rfl: 1 .  baclofen (LIORESAL) 10 MG tablet, TAKE 1 TABLET(10 MG) BY MOUTH AT BEDTIME AS NEEDED FOR MUSCLE SPASMS, Disp: 30 tablet, Rfl: 0 .  Empagliflozin-metFORMIN HCl ER (SYNJARDY XR) 25-1000 MG TB24, Take 1 tablet by mouth daily., Disp: 90 tablet, Rfl: 1 .  fluticasone (FLONASE) 50 MCG/ACT nasal spray, SHAKE LIQUID AND USE 1 SPRAY IN EACH NOSTRIL TWICE DAILY, Disp: 16 g, Rfl: 1 .  glucose blood test strip, Use to test blood sugar once daily. One Touch Verio Test Strips., Disp: 100 each, Rfl: 1 .  hydrochlorothiazide (HYDRODIURIL) 12.5 MG tablet, Take 1 tablet (12.5 mg total) by mouth daily., Disp: 90 tablet, Rfl: 2 .  losartan (COZAAR) 50 MG tablet, Take 1 tablet (50 mg total) by mouth daily., Disp: 90 tablet, Rfl: 2 .  montelukast (SINGULAIR) 10 MG tablet, TAKE 1 TABLET BY MOUTH AT BEDTIME, Disp: 90 tablet, Rfl: 0 .  omeprazole (PRILOSEC) 40 MG capsule,  TAKE 1 CAPSULE(40 MG) BY MOUTH DAILY, Disp: 90 capsule, Rfl: 2 .  ONETOUCH DELICA LANCETS 36U MISC, Use to test blood sugar once daily., Disp: 100 each, Rfl: 1 .  WIXELA INHUB 250-50 MCG/DOSE AEPB, INHALE 1 PUFF INTO THE LUNGS TWICE DAILY, Disp: 60 each, Rfl: 1  EXAM:  VITALS per patient if applicable:BP (!) 440/34   Pulse 89   Resp 12   GENERAL: alert, oriented, appears well and in no acute distress  HEENT: atraumatic, conjunctiva clear, no obvious facial abnormalities on inspection.  NECK: normal movements of the head and neck  LUNGS: on inspection no signs of respiratory distress, breathing rate appears normal, no obvious gross SOB, gasping or wheezing  CV: no obvious cyanosis  Monica: moves all visible extremities without noticeable abnormality  PSYCH/NEURO: pleasant and cooperative, no obvious depression or anxiety, speech and thought processing grossly intact  ASSESSMENT AND PLAN:  Discussed the following assessment and plan:  Orders Placed This Encounter  Procedures  . Hemoglobin A1c  . Basic metabolic panel  . Fructosamine  . Lipid panel     Type 2 diabetes mellitus with other specified complication ([place]) EGB1D has not been at goal. Reporting "good" BS's No changes in current management, treatment will be adjusted according to A1c. Regular exercise and healthy diet with avoidance of added sugar food intake is an important part of treatment and recommended. Annual eye exam (she is due late April) and foot care recommended. F/U in 5-6 months   Hypertension, essential, benign BP is not at goal, elevated today. BP cuff seems to be short.  Educated about appropriate technique to check BP as well as appropriate size of BP monitor cuff. Resume HCTZ 12.5 mg and losartan 50 mg. Amlodipine increased from 2.5 mg to 5 mg. Continue low-salt diet. Monitor blood pressure periodically.   GERD (gastroesophageal reflux disease) Problem is well controlled. She has not had  episodes of chest pain. No changes in omeprazole 40 mg daily. Continue GERD precautions.  Hyperlipidemia associated with type 2 diabetes mellitus ([place]) Currently she is not on statin medication. She is coming next week for lab work, starting medication and dose will be recommended according to lipid panel results. We discussed the benefits of statin medications in regard to CV protection.  Allergic rhinitis Continue Singulair 10 mg daily and Flonase nasal spray daily. Saline nasal irrigations recommended, as needed.   Because lab work has not been done in over 6 months, lab appt will be arranged for next week.   I discussed the assessment and treatment plan with the patient. She was provided an opportunity to ask questions and all were answered. The patient agreed with the plan and demonstrated an understanding of the instructions.   The patient was advised to call back or seek an in-person evaluation if the symptoms worsen or if the condition fails to improve as anticipated.  Return in about 6 months (around 12/06/2018) for DM,HTN,HLD,wt.    Deano Tomaszewski [place], MD

## 2018-06-06 NOTE — Assessment & Plan Note (Signed)
Currently she is not on statin medication. She is coming next week for lab work, starting medication and dose will be recommended according to lipid panel results. We discussed the benefits of statin medications in regard to CV protection.

## 2018-06-06 NOTE — Assessment & Plan Note (Signed)
Problem is well controlled. She has not had episodes of chest pain. No changes in omeprazole 40 mg daily. Continue GERD precautions.

## 2018-06-06 NOTE — Assessment & Plan Note (Signed)
HgA1C has not been at goal. Reporting "good" BS's No changes in current management, treatment will be adjusted according to A1c. Regular exercise and healthy diet with avoidance of added sugar food intake is an important part of treatment and recommended. Annual eye exam (she is due late April) and foot care recommended. F/U in 5-6 months

## 2018-06-06 NOTE — Assessment & Plan Note (Addendum)
BP is not at goal, elevated today. BP cuff seems to be short.  Educated about appropriate technique to check BP as well as appropriate size of BP monitor cuff. Resume HCTZ 12.5 mg and losartan 50 mg. Amlodipine increased from 2.5 mg to 5 mg. Continue low-salt diet. Monitor blood pressure periodically.

## 2018-06-06 NOTE — Telephone Encounter (Signed)
OV scheduled at 3:15 pm on 06/06/2018.

## 2018-06-06 NOTE — Assessment & Plan Note (Signed)
Continue Singulair 10 mg daily and Flonase nasal spray daily. Saline nasal irrigations recommended, as needed.

## 2018-06-08 MED ORDER — OMEPRAZOLE 40 MG PO CPDR: DELAYED_RELEASE_CAPSULE | ORAL | 2 refills | Status: DC

## 2018-06-09 ENCOUNTER — Other Ambulatory Visit: Payer: Self-pay

## 2018-06-09 ENCOUNTER — Other Ambulatory Visit (INDEPENDENT_AMBULATORY_CARE_PROVIDER_SITE_OTHER): Payer: BC Managed Care – PPO

## 2018-06-09 DIAGNOSIS — E785 Hyperlipidemia, unspecified: Secondary | ICD-10-CM | POA: Diagnosis not present

## 2018-06-09 DIAGNOSIS — I10 Essential (primary) hypertension: Secondary | ICD-10-CM | POA: Diagnosis not present

## 2018-06-09 DIAGNOSIS — E1169 Type 2 diabetes mellitus with other specified complication: Secondary | ICD-10-CM | POA: Diagnosis not present

## 2018-06-09 LAB — BASIC METABOLIC PANEL
BUN: 11 mg/dL (ref 6–23)
CO2: 27 mEq/L (ref 19–32)
Calcium: 9.4 mg/dL (ref 8.4–10.5)
Chloride: 101 mEq/L (ref 96–112)
Creatinine, Ser: 0.68 mg/dL (ref 0.40–1.20)
GFR: 113.12 mL/min (ref >60.00)
Glucose, Bld: 129 mg/dL — ABNORMAL HIGH (ref 70–99)
Potassium: 4.7 mEq/L (ref 3.5–5.1)
Sodium: 136 mEq/L (ref 135–145)

## 2018-06-09 LAB — LIPID PANEL
Cholesterol: 185 mg/dL (ref 0–200)
HDL: 60 mg/dL (ref >39.00)
LDL Cholesterol: 109 mg/dL — ABNORMAL HIGH (ref 0–99)
NonHDL: 125.39
Total CHOL/HDL Ratio: 3
Triglycerides: 84 mg/dL (ref 0.0–149.0)
VLDL: 16.8 mg/dL (ref 0.0–40.0)

## 2018-06-09 LAB — HEMOGLOBIN A1C: Hgb A1c MFr Bld: 7.3 % — ABNORMAL HIGH (ref 4.6–6.5)

## 2018-06-10 LAB — FRUCTOSAMINE: Fructosamine: 230 umol/L (ref 205–285)

## 2018-06-16 ENCOUNTER — Encounter: Payer: Self-pay | Admitting: *Deleted

## 2018-06-22 ENCOUNTER — Other Ambulatory Visit: Payer: Self-pay | Admitting: Family Medicine

## 2018-06-22 DIAGNOSIS — R053 Chronic cough: Secondary | ICD-10-CM

## 2018-06-22 DIAGNOSIS — J309 Allergic rhinitis, unspecified: Secondary | ICD-10-CM

## 2018-06-22 DIAGNOSIS — R05 Cough: Secondary | ICD-10-CM

## 2018-09-01 ENCOUNTER — Encounter: Payer: Self-pay | Admitting: Family Medicine

## 2018-09-03 ENCOUNTER — Encounter: Payer: Self-pay | Admitting: Family Medicine

## 2018-09-03 ENCOUNTER — Other Ambulatory Visit: Payer: Self-pay

## 2018-09-03 ENCOUNTER — Other Ambulatory Visit: Payer: Self-pay | Admitting: *Deleted

## 2018-09-03 ENCOUNTER — Ambulatory Visit (INDEPENDENT_AMBULATORY_CARE_PROVIDER_SITE_OTHER): Payer: BC Managed Care – PPO | Admitting: Family Medicine

## 2018-09-03 VITALS — BP 126/101 | HR 94 | Resp 16

## 2018-09-03 DIAGNOSIS — I10 Essential (primary) hypertension: Secondary | ICD-10-CM

## 2018-09-03 DIAGNOSIS — R202 Paresthesia of skin: Secondary | ICD-10-CM

## 2018-09-03 DIAGNOSIS — E1169 Type 2 diabetes mellitus with other specified complication: Secondary | ICD-10-CM

## 2018-09-03 DIAGNOSIS — M545 Low back pain, unspecified: Secondary | ICD-10-CM

## 2018-09-03 DIAGNOSIS — N898 Other specified noninflammatory disorders of vagina: Secondary | ICD-10-CM

## 2018-09-03 MED ORDER — PREDNISONE 20 MG PO TABS: ORAL_TABLET | ORAL | 0 refills | Status: AC

## 2018-09-03 MED ORDER — FLUCONAZOLE 150 MG PO TABS: 150.0000 mg | ORAL_TABLET | ORAL | 0 refills | Status: AC

## 2018-09-03 MED ORDER — TERCONAZOLE 0.4 % VA CREA: 1.0000 | TOPICAL_CREAM | Freq: Every day | VAGINAL | 0 refills | Status: AC

## 2018-09-03 MED ORDER — LOSARTAN POTASSIUM 100 MG PO TABS: 100.0000 mg | ORAL_TABLET | Freq: Every day | ORAL | 1 refills | Status: DC

## 2018-09-03 MED ORDER — PEN NEEDLES 32G X 4 MM MISC: 1.0000 "pen " | Freq: Every day | 3 refills | Status: DC

## 2018-09-03 MED ORDER — BASAGLAR KWIKPEN 100 UNIT/ML ~~LOC~~ SOPN: 15.0000 [IU] | PEN_INJECTOR | Freq: Every day | SUBCUTANEOUS | 1 refills | Status: DC

## 2018-09-03 NOTE — Progress Notes (Signed)
Virtual Visit via Video Note   I connected with [name] on 09/04/18 at  9:00 AM EDT by a video enabled telemedicine application and verified that I am speaking with the correct person using two identifiers.  Location patient: home Location provider:work office Persons participating in the virtual visit: patient, provider  I discussed the limitations of evaluation and management by telemedicine and the availability of in person appointments. The patient expressed understanding and agreed to proceed.  Chief Complaint  Patient presents with  . Back Pain    some issues in my left arm with tingling in the underarm area as well as pain in my lower back and kidney area on the left side as well    HPI:  [name] is a 43 years old female with history of asthma, diabetes, hypertension, and chronic back pain who is complaining about intermittent  left upper extremity tingling/numbness sensation that is started about 10 days ago.  She denies prior history. In the past she has had neck pain, she apparently received epidural injection x1, and had cervical MRI.  She denies associated fever, chills, local edema or erythema. She has not identified exacerbating or alleviating factors. Negative for cough, wheezing, or dyspnea.  Also for the past 10 days she has had left lower back pain radiated to LLQ abdominal/groin area. 8/10,constant. Pain is exacerbated when she is getting up,alleviated by rest. "Gas pressure." It does not improve after defecation.  She denies associated N/V, changes in bowel habits, lower extremity numbness, tingling, saddle anesthesia, focal weakness, dysuria, gross hematuria, changes in urinary frequency, or skin rash. Negative for urine/bowel incontinence.   She is also complaining of a few days of vaginal pruritus, intermittently. It seems to be worse during warm days. She has not noted vaginal discharge or bleeding. She has not tried OTC medication.  DM 2, she is  concerned about elevated BS for the past few days, 170s.  Lab Results  Component Value Date   HGBA1C 7.3 (H) 06/09/2018   Currently she is on empagliflozin-metformin ER 25-1000 mg daily. Denies polydipsia,polyuria, or polyphagia.   Also DBP number mildly elevated,90's-100's.  She denies unusual headache, visual changes, palpitations, or edema. Currently she is on Cozaar 50 mg daily, amlodipine 5 mg daily, and HCTZ 12.5 mg daily. + Smoker.  Lab Results  Component Value Date   CREATININE 0.68 06/09/2018   BUN 11 06/09/2018   NA 136 06/09/2018   K 4.7 06/09/2018   CL 101 06/09/2018   CO2 27 06/09/2018    ROS: See pertinent positives and negatives per HPI.  Past Medical History:  Diagnosis Date  . Diabetes mellitus without complication ([place]) 16/1096  . Hypertension   . OSA (obstructive sleep apnea)     Past Surgical History:  Procedure Laterality Date  . ABDOMINAL HYSTERECTOMY  02/19/2006  . FOOT SURGERY     Left  . KNEE ARTHROSCOPY    . ROTATOR CUFF REPAIR Left     Family History  Problem Relation Age of Onset  . Hyperlipidemia Mother   . Stroke Mother   . Heart disease Mother   . Hypertension Mother   . Diabetes Mother   . Hyperlipidemia Father   . Heart disease Father   . Stroke Father   . Hypertension Father   . Diabetes Father   . Colon cancer Father   . Diabetes Brother   . Hypertension Maternal Grandmother   . Diabetes Mellitus II Maternal Grandmother   . Colon cancer Paternal  Uncle     Social History   Socioeconomic History  . Marital status: Married    Spouse name: Not on file  . Number of children: 1  . Years of education: Not on file  . Highest education level: Not on file  Occupational History  . Occupation: Software engineer  Social Needs  . Financial resource strain: Not on file  . Food insecurity    Worry: Not on file    Inability: Not on file  . Transportation needs    Medical: Not on file    Non-medical: Not on file   Tobacco Use  . Smoking status: Current Some Day Smoker    Packs/day: 0.50  . Smokeless tobacco: Never Used  . Tobacco comment: down to 3 cigarettes per day  Substance and Sexual Activity  . Alcohol use: Yes    Comment: socially  . Drug use: No  . Sexual activity: Not on file  Lifestyle  . Physical activity    Days per week: Not on file    Minutes per session: Not on file  . Stress: Not on file  Relationships  . Social Herbalist on phone: Not on file    Gets together: Not on file    Attends religious service: Not on file    Active member of club or organization: Not on file    Attends meetings of clubs or organizations: Not on file    Relationship status: Not on file  . Intimate partner violence    Fear of current or ex partner: Not on file    Emotionally abused: Not on file    Physically abused: Not on file    Forced sexual activity: Not on file  Other Topics Concern  . Not on file  Social History Narrative  . Not on file     Current Outpatient Medications:  .  albuterol (VENTOLIN HFA) 108 (90 Base) MCG/ACT inhaler, INHALE 2 PUFFS INTO THE LUNGS EVERY 6 HOURS AS NEEDED FOR WHEEZING OR SHORTNESS OF BREATH, Disp: 8.5 g, Rfl: 0 .  amLODipine (NORVASC) 5 MG tablet, Take 1 tablet (5 mg total) by mouth daily., Disp: 90 tablet, Rfl: 1 .  baclofen (LIORESAL) 10 MG tablet, TAKE 1 TABLET(10 MG) BY MOUTH AT BEDTIME AS NEEDED FOR MUSCLE SPASMS, Disp: 30 tablet, Rfl: 0 .  Empagliflozin-metFORMIN HCl ER (SYNJARDY XR) 25-1000 MG TB24, Take 1 tablet by mouth daily., Disp: 90 tablet, Rfl: 1 .  fluconazole (DIFLUCAN) 150 MG tablet, Take 1 tablet (150 mg total) by mouth once a week for 2 doses., Disp: 2 tablet, Rfl: 0 .  fluticasone (FLONASE) 50 MCG/ACT nasal spray, SHAKE LIQUID AND USE 1 SPRAY IN EACH NOSTRIL TWICE DAILY, Disp: 16 g, Rfl: 1 .  glucose blood test strip, Use to test blood sugar once daily. One Touch Verio Test Strips., Disp: 100 each, Rfl: 1 .  hydrochlorothiazide  (HYDRODIURIL) 12.5 MG tablet, Take 1 tablet (12.5 mg total) by mouth daily., Disp: 90 tablet, Rfl: 2 .  Insulin Glargine (BASAGLAR KWIKPEN) 100 UNIT/ML SOPN, Inject 0.15 mLs (15 Units total) into the skin at bedtime., Disp: 3 pen, Rfl: 1 .  Insulin Pen Needle (PEN NEEDLES) 32G X 4 MM MISC, 1 pen by Does not apply route daily., Disp: 100 each, Rfl: 3 .  losartan (COZAAR) 100 MG tablet, Take 1 tablet (100 mg total) by mouth daily., Disp: 90 tablet, Rfl: 1 .  montelukast (SINGULAIR) 10 MG tablet, TAKE 1 TABLET BY  MOUTH AT BEDTIME, Disp: 90 tablet, Rfl: 0 .  omeprazole (PRILOSEC) 40 MG capsule, TAKE 1 CAPSULE(40 MG) BY MOUTH DAILY, Disp: 90 capsule, Rfl: 2 .  ONETOUCH DELICA LANCETS 89H MISC, Use to test blood sugar once daily., Disp: 100 each, Rfl: 1 .  predniSONE (DELTASONE) 20 MG tablet, 3 tabs for 3 days, 2 tabs for 3 days, 1 tabs for 3 days, and 1/2 tab for 3 days. Take tables together with breakfast., Disp: 20 tablet, Rfl: 0 .  terconazole (TERAZOL 7) 0.4 % vaginal cream, Place 1 applicator vaginally at bedtime for 7 days., Disp: 45 g, Rfl: 0 .  WIXELA INHUB 250-50 MCG/DOSE AEPB, INHALE 1 PUFF INTO THE LUNGS TWICE DAILY, Disp: 60 each, Rfl: 1  EXAM:  VITALS per patient if applicable:BP (!) 734/287   Pulse 94   Resp 16   GENERAL: alert, oriented, appears well and in no acute distress  HEENT: atraumatic, normocephalic,conjunctiva clear, no obvious abnormalities on face inspection.  NECK: normal movements of the head and neck  LUNGS: on inspection no signs of respiratory distress, breathing rate appears normal, no obvious gross SOB, gasping or wheezing  CV: no obvious cyanosis  MS: moves all visible extremities without noticeable abnormality, No limitation and no pain elicit with left shoulder ROM.  PSYCH/NEURO: pleasant and cooperative, no obvious depression or anxiety, speech and thought processing grossly intact  ASSESSMENT AND PLAN:  Discussed the following assessment and  plan:  Tingling of left upper extremity - Plan: predniSONE (DELTASONE) 20 MG tablet We discussed possible etiologies, ? Radiculopathy. After discussion of some side effects, she agrees with trying Prednisone taper. If not resolved in 4-6 weeks she is going to need cervical MRI. Instructed about warning signs.  Vaginal pruritus - Plan: Hx suggest fungal etiology, empiric treatment with Diflucan and Terazol. Empaglifozin could aggravate problem. F/U as needed.  Left low back pain, unspecified chronicity, unspecified whether sciatica present - Plan: Hx of chronic pain. With radiation, ?radicular pain. Prednisone may help. Instructed about warning signs.  Type 2 diabetes mellitus with other specified complication ([facility]) Based on BS problem does not seem to be well controlled and because we are starting a cycle of prednisone most likely is going to get worse.  She agrees with Basaglar 15 units daily while taking prednisone +2 weeks. No changes in rest of her medications, we discussed side effects empagliflozin. We also discussed possible complications of poorly controlled diabetes. Follow-up late 09/2018.  Hypertension, essential, benign Losartan dose increased from 50 mg to 100 mg. Continue monitoring BP regularly. Follow-up in 4 to 5 weeks.     I discussed the assessment and treatment plan with the patient.  She was provided an opportunity to ask questions and all were answered.  She agreed with the plan and demonstrated an understanding of the instructions.   The patient was advised to call back or seek an in-person evaluation if the symptoms worsen or if the condition fails to improve as anticipated.  Return in about 6 weeks (around 10/15/2018) for DM II,HTN.     [place], MD

## 2018-09-03 NOTE — Assessment & Plan Note (Signed)
Losartan dose increased from 50 mg to 100 mg. Continue monitoring BP regularly. Follow-up in 4 to 5 weeks.

## 2018-09-03 NOTE — Assessment & Plan Note (Signed)
Based on BS problem does not seem to be well controlled and because we are starting a cycle of prednisone most likely is going to get worse.  She agrees with Basaglar 15 units daily while taking prednisone +2 weeks. No changes in rest of her medications, we discussed side effects empagliflozin. We also discussed possible complications of poorly controlled diabetes. Follow-up late 09/2018.

## 2018-09-23 ENCOUNTER — Encounter: Payer: Self-pay | Admitting: Family Medicine

## 2018-09-26 NOTE — Telephone Encounter (Signed)
Spoke with patient and scheduled virtual visit for Monday, patient could not do it today.

## 2018-09-29 ENCOUNTER — Telehealth (INDEPENDENT_AMBULATORY_CARE_PROVIDER_SITE_OTHER): Payer: BC Managed Care – PPO | Admitting: Family Medicine

## 2018-09-29 ENCOUNTER — Other Ambulatory Visit: Payer: Self-pay

## 2018-09-29 DIAGNOSIS — B373 Candidiasis of vulva and vagina: Secondary | ICD-10-CM | POA: Diagnosis not present

## 2018-09-29 DIAGNOSIS — I10 Essential (primary) hypertension: Secondary | ICD-10-CM | POA: Diagnosis not present

## 2018-09-29 DIAGNOSIS — B3731 Acute candidiasis of vulva and vagina: Secondary | ICD-10-CM

## 2018-09-29 DIAGNOSIS — E1169 Type 2 diabetes mellitus with other specified complication: Secondary | ICD-10-CM

## 2018-09-29 NOTE — Progress Notes (Signed)
Virtual Visit via Video Note   I connected with Monica [name] on 8/10/20by a video enabled telemedicine application and verified that I am speaking with the correct person using two identifiers.  Location patient: home Location provider:work office Persons participating in the virtual visit: patient, provider  I discussed the limitations of evaluation and management by telemedicine and the availability of in person appointments. The patient expressed understanding and agreed to proceed.   HPI: Monica [name] is a 43 yo female with Hx of DM II,HTN,and HLD who is concerned about vaginal pruritus. She completed Diflucan treatment and symptoms resolved but they started back when she sopped Diflucan. She is on Empagliflozin-Metformin,we have discussed side effects, she was concerned about continue having symptoms recurrently while being on this medication. Vaginal pruritus have improved with OTC topical treatment.  Today I was planning on discussion other medication options. But she does not want to stop Empagliflozin because her BS's are much better since she stated medication. Lab Results  Component Value Date   HGBA1C 7.3 (H) 06/09/2018   She was on Basaglar 15 U daily while on Prednisone treatment. She does not want injectable medication.  BS's 160-170's. Denies visual changes,sore throat,abdominal pain, nausea,vomiting, polydipsia,polyuria, or polyphagia.  Last virtual visit, 09/03/18 BP was elevated at 126/101. She has not noted headache,CP,dyspnea,or palpitations. She has not checked BP. She is on HCTZ 12.5 mg,Cozaar 100 mg,and Amlodipine 5 mg daily.  She is now exercising and trying to improve her diet.  ROS: See pertinent positives and negatives per HPI.  Past Medical History:  Diagnosis Date  . Diabetes mellitus without complication ([place]) 10/3233  . Hypertension   . OSA (obstructive sleep apnea)     Past Surgical History:  Procedure Laterality Date  . ABDOMINAL HYSTERECTOMY   02/19/2006  . FOOT SURGERY     Left  . KNEE ARTHROSCOPY    . ROTATOR CUFF REPAIR Left     Family History  Problem Relation Age of Onset  . Hyperlipidemia Mother   . Stroke Mother   . Heart disease Mother   . Hypertension Mother   . Diabetes Mother   . Hyperlipidemia Father   . Heart disease Father   . Stroke Father   . Hypertension Father   . Diabetes Father   . Colon cancer Father   . Diabetes Brother   . Hypertension Maternal Grandmother   . Diabetes Mellitus II Maternal Grandmother   . Colon cancer Paternal Uncle     Social History   Socioeconomic History  . Marital status: Married    Spouse name: Not on file  . Number of children: 1  . Years of education: Not on file  . Highest education level: Not on file  Occupational History  . Occupation: Software engineer  Social Needs  . Financial resource strain: Not on file  . Food insecurity    Worry: Not on file    Inability: Not on file  . Transportation needs    Medical: Not on file    Non-medical: Not on file  Tobacco Use  . Smoking status: Current Some Day Smoker    Packs/day: 0.50  . Smokeless tobacco: Never Used  . Tobacco comment: down to 3 cigarettes per day  Substance and Sexual Activity  . Alcohol use: Yes    Comment: socially  . Drug use: No  . Sexual activity: Not on file  Lifestyle  . Physical activity    Days per week: Not on file  Minutes per session: Not on file  . Stress: Not on file  Relationships  . Social Herbalist on phone: Not on file    Gets together: Not on file    Attends religious service: Not on file    Active member of club or organization: Not on file    Attends meetings of clubs or organizations: Not on file    Relationship status: Not on file  . Intimate partner violence    Fear of current or ex partner: Not on file    Emotionally abused: Not on file    Physically abused: Not on file    Forced sexual activity: Not on file  Other Topics Concern   . Not on file  Social History Narrative  . Not on file      Current Outpatient Medications:  .  albuterol (VENTOLIN HFA) 108 (90 Base) MCG/ACT inhaler, INHALE 2 PUFFS INTO THE LUNGS EVERY 6 HOURS AS NEEDED FOR WHEEZING OR SHORTNESS OF BREATH, Disp: 8.5 g, Rfl: 0 .  amLODipine (NORVASC) 5 MG tablet, Take 1 tablet (5 mg total) by mouth daily., Disp: 90 tablet, Rfl: 1 .  baclofen (LIORESAL) 10 MG tablet, TAKE 1 TABLET(10 MG) BY MOUTH AT BEDTIME AS NEEDED FOR MUSCLE SPASMS, Disp: 30 tablet, Rfl: 0 .  Empagliflozin-metFORMIN HCl ER (SYNJARDY XR) 25-1000 MG TB24, Take 1 tablet by mouth daily., Disp: 90 tablet, Rfl: 1 .  fluticasone (FLONASE) 50 MCG/ACT nasal spray, SHAKE LIQUID AND USE 1 SPRAY IN EACH NOSTRIL TWICE DAILY, Disp: 16 g, Rfl: 1 .  glucose blood test strip, Use to test blood sugar once daily. One Touch Verio Test Strips., Disp: 100 each, Rfl: 1 .  hydrochlorothiazide (HYDRODIURIL) 12.5 MG tablet, Take 1 tablet (12.5 mg total) by mouth daily., Disp: 90 tablet, Rfl: 2 .  Insulin Glargine (BASAGLAR KWIKPEN) 100 UNIT/ML SOPN, Inject 0.15 mLs (15 Units total) into the skin at bedtime., Disp: 3 pen, Rfl: 1 .  Insulin Pen Needle (PEN NEEDLES) 32G X 4 MM MISC, 1 pen by Does not apply route daily., Disp: 100 each, Rfl: 3 .  losartan (COZAAR) 100 MG tablet, Take 1 tablet (100 mg total) by mouth daily., Disp: 90 tablet, Rfl: 1 .  montelukast (SINGULAIR) 10 MG tablet, TAKE 1 TABLET BY MOUTH AT BEDTIME, Disp: 90 tablet, Rfl: 0 .  omeprazole (PRILOSEC) 40 MG capsule, TAKE 1 CAPSULE(40 MG) BY MOUTH DAILY, Disp: 90 capsule, Rfl: 2 .  ONETOUCH DELICA LANCETS 91Q MISC, Use to test blood sugar once daily., Disp: 100 each, Rfl: 1 .  WIXELA INHUB 250-50 MCG/DOSE AEPB, INHALE 1 PUFF INTO THE LUNGS TWICE DAILY, Disp: 60 each, Rfl: 1  EXAM:  VITALS per patient if applicable:  GENERAL: alert, oriented, appears well and in no acute distress  HEENT: atraumatic, conjunttiva clear, no obvious abnormalities  on inspection of external nose and ears  NECK: normal movements of the head and neck  LUNGS: on inspection no signs of respiratory distress, breathing rate appears normal, no obvious gross SOB, gasping or wheezing  CV: no obvious cyanosis  Monica: moves all visible extremities without noticeable abnormality  PSYCH/NEURO: pleasant and cooperative, no obvious depression or anxiety, speech and thought processing grossly intact  ASSESSMENT AND PLAN:  Discussed the following assessment and plan:  Hypertension, essential, benign - Plan: Recommend checking BP regularly. Complications of elevated BP discussed. No changes in current management for now. She states that will check BP and let me know reading.  Type 2 diabetes mellitus with other specified complication, without long-term current use of insulin ([place]) - Plan: BS's still mildly elevated and HgA1C not at goal. No changes in current management for now. She started exercising more regularly, if she is consistent her BS hopefully improved.   Vulvovaginitis due to yeast - Plan: Side effects of SGLT2 inhibitors. She has used topic Mycostatin and so far is helping. F/U as needed.   I discussed the assessment and treatment plan with the patient. She was provided an opportunity to ask questions and all were answered. She agreed with the plan and demonstrated an understanding of the instructions.   The patient was advised to call back or seek an in-person evaluation if the symptoms worsen or if the condition fails to improve as anticipated.  No follow-ups on file.    Chevonne Bostrom [place], MD

## 2018-10-10 ENCOUNTER — Encounter: Payer: Self-pay | Admitting: Family Medicine

## 2018-11-22 ENCOUNTER — Other Ambulatory Visit: Payer: Self-pay | Admitting: Family Medicine

## 2018-11-22 DIAGNOSIS — J309 Allergic rhinitis, unspecified: Secondary | ICD-10-CM

## 2018-12-03 ENCOUNTER — Encounter: Payer: Self-pay | Admitting: Family Medicine

## 2018-12-03 ENCOUNTER — Telehealth (INDEPENDENT_AMBULATORY_CARE_PROVIDER_SITE_OTHER): Payer: BC Managed Care – PPO | Admitting: Family Medicine

## 2018-12-03 VITALS — BP 126/90 | Resp 12 | Ht 74.0 in

## 2018-12-03 DIAGNOSIS — Z794 Long term (current) use of insulin: Secondary | ICD-10-CM

## 2018-12-03 DIAGNOSIS — H60502 Unspecified acute noninfective otitis externa, left ear: Secondary | ICD-10-CM

## 2018-12-03 DIAGNOSIS — E1169 Type 2 diabetes mellitus with other specified complication: Secondary | ICD-10-CM

## 2018-12-03 DIAGNOSIS — I10 Essential (primary) hypertension: Secondary | ICD-10-CM | POA: Diagnosis not present

## 2018-12-03 MED ORDER — NEOMYCIN-POLYMYXIN-HC 3.5-10000-1 OT SOLN: 3.0000 [drp] | Freq: Four times a day (QID) | OTIC | 0 refills | Status: AC

## 2018-12-03 NOTE — Progress Notes (Signed)
Virtual Visit via Video Note   I connected with Monica [name] on 12/03/18 by a video enabled telemedicine application and verified that I am speaking with the correct person using two identifiers.  Location patient: home Location provider:work office Persons participating in the virtual visit: patient, provider  I discussed the limitations of evaluation and management by telemedicine and the availability of in person appointments. The patient expressed understanding and agreed to proceed.   HPI: Monica. Monica [name] is a 43 yo female with Hx of DM II,obesity,HTN,and allergies who is c/o 2 weeks of left earache. According to patient, about 2 weeks ago she was diagnosed and treated for acute sinusitis.  Since then she has had left earache, severe soreness, constant. No Hx of ear trauma. She has not identified exacerbating or alleviating factors.  She has new earphones, so thought she was irritating ear canal but pain is [name] worse,radiating to left mandible. She has noted ear to be "wet" and "little full", like she is in a "echo chamber."  She has no use of OTC medications. She denies fever, chills, fatigue, body aches, sore throat, oral lesions, hearing loss,headache, facial pain, cough, wheezing, and dyspnea.   Last F/U on chronic health problems was on 06/09/2018.  DM II Currently she is on Basaglar 15 units daily and empagliflozin-Metformin ER 25-1000 mg daily. BS 140s. Denies abdominal pain, nausea,vomiting, polydipsia,polyuria, or polyphagia.   Lab Results  Component Value Date   HGBA1C 7.3 (H) 06/09/2018   Hypertension: Currently she is on losartan 100 mg daily, HCTZ 12.5 mg daily, and amlodipine 5 mg daily. BP readings have been "good", 120s/80s-90.  Negative for chest pain, palpitations, or edema.  She is trying to follow a healthier diet and has been exercising regularly about twice per week.  ROS: See pertinent positives and negatives per HPI.  Past Medical History:   Diagnosis Date  . Diabetes mellitus without complication ([place]) [id number]  . Hypertension   . OSA (obstructive sleep apnea)     Past Surgical History:  Procedure Laterality Date  . ABDOMINAL HYSTERECTOMY  02/19/2006  . FOOT SURGERY     Left  . KNEE ARTHROSCOPY    . ROTATOR CUFF REPAIR Left     Family History  Problem Relation Age of Onset  . Hyperlipidemia Mother   . Stroke Mother   . Heart disease Mother   . Hypertension Mother   . Diabetes Mother   . Hyperlipidemia Father   . Heart disease Father   . Stroke Father   . Hypertension Father   . Diabetes Father   . Colon cancer Father   . Diabetes Brother   . Hypertension Maternal Grandmother   . Diabetes Mellitus II Maternal Grandmother   . Colon cancer Paternal Uncle     Social History   Socioeconomic History  . Marital status: Married    Spouse name: Not on file  . Number of children: 1  . Years of education: Not on file  . Highest education level: Not on file  Occupational History  . Occupation: Software engineer  Social Needs  . Financial resource strain: Not on file  . Food insecurity    Worry: Not on file    Inability: Not on file  . Transportation needs    Medical: Not on file    Non-medical: Not on file  Tobacco Use  . Smoking status: Current Some Day Smoker    Packs/day: 0.50  . Smokeless tobacco: Never Used  . Tobacco comment: down  to 3 cigarettes per day  Substance and Sexual Activity  . Alcohol use: Yes    Comment: socially  . Drug use: No  . Sexual activity: Not on file  Lifestyle  . Physical activity    Days per week: Not on file    Minutes per session: Not on file  . Stress: Not on file  Relationships  . Social Herbalist on phone: Not on file    Gets together: Not on file    Attends religious service: Not on file    Active member of club or organization: Not on file    Attends meetings of clubs or organizations: Not on file    Relationship status: Not on file   . Intimate partner violence    Fear of current or ex partner: Not on file    Emotionally abused: Not on file    Physically abused: Not on file    Forced sexual activity: Not on file  Other Topics Concern  . Not on file  Social History Narrative  . Not on file    Current Outpatient Medications:  .  albuterol (VENTOLIN HFA) 108 (90 Base) MCG/ACT inhaler, INHALE 2 PUFFS INTO THE LUNGS EVERY 6 HOURS AS NEEDED FOR WHEEZING OR SHORTNESS OF BREATH, Disp: 8.5 g, Rfl: 0 .  amLODipine (NORVASC) 5 MG tablet, Take 1 tablet (5 mg total) by mouth daily., Disp: 90 tablet, Rfl: 1 .  baclofen (LIORESAL) 10 MG tablet, TAKE 1 TABLET(10 MG) BY MOUTH AT BEDTIME AS NEEDED FOR MUSCLE SPASMS, Disp: 30 tablet, Rfl: 0 .  Empagliflozin-metFORMIN HCl ER (SYNJARDY XR) 25-1000 MG TB24, Take 1 tablet by mouth daily., Disp: 90 tablet, Rfl: 1 .  fluticasone (FLONASE) 50 MCG/ACT nasal spray, SHAKE LIQUID AND USE 1 SPRAY IN EACH NOSTRIL TWICE DAILY, Disp: 16 g, Rfl: 1 .  glucose blood test strip, Use to test blood sugar once daily. One Touch Verio Test Strips., Disp: 100 each, Rfl: 1 .  hydrochlorothiazide (HYDRODIURIL) 12.5 MG tablet, Take 1 tablet (12.5 mg total) by mouth daily., Disp: 90 tablet, Rfl: 2 .  Insulin Glargine (BASAGLAR KWIKPEN) 100 UNIT/ML SOPN, Inject 0.15 mLs (15 Units total) into the skin at bedtime., Disp: 3 pen, Rfl: 1 .  Insulin Pen Needle (PEN NEEDLES) 32G X 4 MM MISC, 1 pen by Does not apply route daily., Disp: 100 each, Rfl: 3 .  losartan (COZAAR) 100 MG tablet, Take 1 tablet (100 mg total) by mouth daily., Disp: 90 tablet, Rfl: 1 .  montelukast (SINGULAIR) 10 MG tablet, TAKE 1 TABLET BY MOUTH AT BEDTIME, Disp: 90 tablet, Rfl: 0 .  neomycin-polymyxin-hydrocortisone (CORTISPORIN) OTIC solution, Place 3 drops into the left ear 4 (four) times daily for 10 days., Disp: 10 mL, Rfl: 0 .  omeprazole (PRILOSEC) 40 MG capsule, TAKE 1 CAPSULE(40 MG) BY MOUTH DAILY, Disp: 90 capsule, Rfl: 2 .  ONETOUCH DELICA  LANCETS [id number] MISC, Use to test blood sugar once daily., Disp: 100 each, Rfl: 1 .  WIXELA INHUB 250-50 MCG/DOSE AEPB, INHALE 1 PUFF INTO THE LUNGS TWICE DAILY, Disp: 60 each, Rfl: 1  EXAM:  VITALS per patient if applicable:BP [id number]   Resp 12 Comment: approx  Ht 6\' 2"  (1.88 m)   BMI 38.95 kg/m   GENERAL: alert, oriented, appears well and in no acute distress  HEENT: atraumatic, conjunctiva clear, no obvious abnormalities on inspection of external nose and ears. Tenderness upon pressing tragus and mild upon pulling Hellix. No edema or  erythema appreciated.  NECK: normal movements of the head and neck  LUNGS: on inspection no signs of respiratory distress, breathing rate appears normal, no obvious gross SOB, gasping or wheezing  CV: no obvious cyanosis  Monica: moves all visible extremities without noticeable abnormality  PSYCH/NEURO: pleasant and cooperative, no obvious depression or anxiety, speech and thought processing grossly intact  ASSESSMENT AND PLAN:  Discussed the following assessment and plan:  Acute otitis externa of left ear, unspecified type - Plan: neomycin-polymyxin-hydrocortisone (CORTISPORIN) OTIC solution Educated about diagnosis. Recommend topical antibiotic, Cortisporin x10 days. Clearly instructed about warning signs.  Type 2 diabetes mellitus with other specified complication, with long-term current use of insulin ([facility]) - Plan: Basic metabolic panel, Hemoglobin A1c HgA1C has not been at goal. No changes in current management. Regular exercise and healthy diet with avoidance of added sugar food intake is an important part of treatment and recommended. We will arrange blood work. F/U in 5-6 months  Morbid obesity ([facility]) We discussed benefits of wt loss as well as adverse effects of obesity. Consistency with healthy diet and physical activity recommended, encouraged to continue.Marland Kitchen   Hypertension, essential, benign - Plan: Basic metabolic panel BP seems to be  better controlled. No changes in current management. Continue low-salt diet. Continue monitoring BP regularly.  I discussed the assessment and treatment plan with the patient. She was provided an opportunity to ask questions and all were answered. She agreed with the plan and demonstrated an understanding of the instructions.   The patient was advised to call back or seek an in-person evaluation if the symptoms worsen or if the condition fails to improve as anticipated.  Return Needs lab appt and nurse appt for flu vaccine, ideally a Monday..    Firas Guardado [place], MD

## 2018-12-04 NOTE — Progress Notes (Signed)
Scheduled for 12/08/2018 at 8 AM

## 2018-12-08 ENCOUNTER — Other Ambulatory Visit: Payer: BC Managed Care – PPO

## 2018-12-17 ENCOUNTER — Encounter: Payer: Self-pay | Admitting: Family Medicine

## 2018-12-18 ENCOUNTER — Other Ambulatory Visit: Payer: Self-pay | Admitting: Family Medicine

## 2018-12-18 NOTE — Telephone Encounter (Signed)
Sent to PCP for approval.  

## 2018-12-19 ENCOUNTER — Other Ambulatory Visit: Payer: Self-pay | Admitting: Family Medicine

## 2018-12-19 DIAGNOSIS — I10 Essential (primary) hypertension: Secondary | ICD-10-CM

## 2018-12-26 ENCOUNTER — Other Ambulatory Visit: Payer: Self-pay

## 2018-12-26 ENCOUNTER — Encounter: Payer: Self-pay | Admitting: Family Medicine

## 2018-12-26 ENCOUNTER — Ambulatory Visit: Payer: BC Managed Care – PPO | Admitting: Family Medicine

## 2018-12-26 VITALS — BP 160/95 | HR 90 | Temp 97.6°F | Resp 16 | Ht 74.0 in | Wt 333.0 lb

## 2018-12-26 DIAGNOSIS — Z23 Encounter for immunization: Secondary | ICD-10-CM | POA: Diagnosis not present

## 2018-12-26 DIAGNOSIS — E1169 Type 2 diabetes mellitus with other specified complication: Secondary | ICD-10-CM | POA: Diagnosis not present

## 2018-12-26 DIAGNOSIS — R600 Localized edema: Secondary | ICD-10-CM

## 2018-12-26 DIAGNOSIS — Z794 Long term (current) use of insulin: Secondary | ICD-10-CM | POA: Diagnosis not present

## 2018-12-26 DIAGNOSIS — I10 Essential (primary) hypertension: Secondary | ICD-10-CM

## 2018-12-26 LAB — BASIC METABOLIC PANEL
BUN: 13 mg/dL (ref 6–23)
CO2: 25 mEq/L (ref 19–32)
Calcium: 8.7 mg/dL (ref 8.4–10.5)
Chloride: 105 mEq/L (ref 96–112)
Creatinine, Ser: 0.76 mg/dL (ref 0.40–1.20)
GFR: 99.25 mL/min (ref >60.00)
Glucose, Bld: 164 mg/dL — ABNORMAL HIGH (ref 70–99)
Potassium: 4 mEq/L (ref 3.5–5.1)
Sodium: 138 mEq/L (ref 135–145)

## 2018-12-26 LAB — MICROALBUMIN / CREATININE URINE RATIO
Creatinine,U: 103.7 mg/dL
Microalb Creat Ratio: 0.9 mg/g (ref 0.0–30.0)
Microalb, Ur: 1 mg/dL (ref 0.0–1.9)

## 2018-12-26 LAB — HEMOGLOBIN A1C: Hgb A1c MFr Bld: 7.6 % — ABNORMAL HIGH (ref 4.6–6.5)

## 2018-12-26 MED ORDER — VICTOZA 18 MG/3ML ~~LOC~~ SOPN: PEN_INJECTOR | SUBCUTANEOUS | 3 refills | Status: DC

## 2018-12-26 MED ORDER — OLMESARTAN MEDOXOMIL-HCTZ 40-25 MG PO TABS: 1.0000 | ORAL_TABLET | Freq: Every day | ORAL | 1 refills | Status: DC

## 2018-12-26 NOTE — Assessment & Plan Note (Signed)
Problem is not well controlled. We discussed possible complications of elevated BP. Continue low-salt diet. Continue amlodipine 5 mg daily. Losartan HCTZ discontinued today.  Benicar HCT 40-25 mg added today. Continue monitoring BP regularly. Recommend bleeding her BP monitor next visit, appropriate technique discussed. She needs to arrange eye exam. Follow-up in 4 months, before if needed.

## 2018-12-26 NOTE — Progress Notes (Signed)
HPI:  Chief Complaint  Patient presents with  . Vaginal Itching  . Follow-up    [name] is a 43 y.o. female, who is here today for chronic disease management. Last seen on 09/03/18.  BP elevated today. Home BP's 126-136/98. Denies severe/frequent headache, visual changes, chest pain, dyspnea, palpitation, claudication, or focal weakness.  For the past couple weeks that she has noted lower extremity edema, worse at the end of the day. It is exacerbated by prolonged walking/standing.   Alleviated by lower extremity elevation, not present in the morning when she first gets up. She has not noted erythema or leg pain.   Lab Results  Component Value Date   CREATININE 0.68 06/09/2018   BUN 11 06/09/2018   NA 136 06/09/2018   K 4.7 06/09/2018   CL 101 06/09/2018   CO2 27 06/09/2018     DM II: Currently she is on Lantus 15 units daily. Discontinued Synjardy XR 25-1000 mg because it was causing recurrent yeast infections. She has not had any problem since she stopped medication. BS's "good." Denies polydipsia,polyuria, or polyphagia.   Lab Results  Component Value Date   HGBA1C 7.3 (H) 06/09/2018   Lab Results  Component Value Date   MICROALBUR 0.8 08/07/2017   She has been exercising regularly and trying to ear healthier. She has noted some wt loss.  Review of Systems  Constitutional: Negative for activity change, appetite change, fatigue and fever.  HENT: Negative for mouth sores, nosebleeds and trouble swallowing.   Eyes: Negative for redness and visual disturbance.  Respiratory: Negative for cough, shortness of breath and wheezing.   Cardiovascular: + LE edema. Gastrointestinal: Negative for abdominal pain, nausea and vomiting.       Negative for changes in bowel habits.  Genitourinary: Negative for decreased urine volume, difficulty urinating, dysuria and hematuria.  Musculoskeletal: Negative for gait problem and myalgias.  Skin: Negative for  color change and rash.  Neurological: Negative for syncope or  weakness.  Rest of ROS see pertinent positives and negatives in HPI.  Current Outpatient Medications on File Prior to Visit  Medication Sig Dispense Refill  . albuterol (VENTOLIN HFA) 108 (90 Base) MCG/ACT inhaler INHALE 2 PUFFS INTO THE LUNGS EVERY 6 HOURS AS NEEDED FOR WHEEZING OR SHORTNESS OF BREATH 8.5 g 0  . amLODipine (NORVASC) 5 MG tablet TAKE 1 TABLET(5 MG) BY MOUTH DAILY 90 tablet 1  . fluticasone (FLONASE) 50 MCG/ACT nasal spray SHAKE LIQUID AND USE 1 SPRAY IN EACH NOSTRIL TWICE DAILY 16 g 1  . glucose blood test strip Use to test blood sugar once daily. One Touch Verio Test Strips. 100 each 1  . Insulin Glargine (BASAGLAR KWIKPEN) 100 UNIT/ML SOPN Inject 0.15 mLs (15 Units total) into the skin at bedtime. 3 pen 1  . Insulin Pen Needle (PEN NEEDLES) 32G X 4 MM MISC 1 pen by Does not apply route daily. 100 each 3  . montelukast (SINGULAIR) 10 MG tablet TAKE 1 TABLET BY MOUTH AT BEDTIME 90 tablet 0  . omeprazole (PRILOSEC) 40 MG capsule TAKE 1 CAPSULE(40 MG) BY MOUTH DAILY 90 capsule 2  . ONETOUCH DELICA LANCETS 99991111 MISC Use to test blood sugar once daily. 100 each 1  . WIXELA INHUB 250-50 MCG/DOSE AEPB INHALE 1 PUFF INTO THE LUNGS TWICE DAILY 60 each 1  . baclofen (LIORESAL) 10 MG tablet TAKE 1 TABLET(10 MG) BY MOUTH AT BEDTIME AS NEEDED FOR MUSCLE SPASMS 30 tablet 0   No  current facility-administered medications on file prior to visit.     Past Medical History:  Diagnosis Date  . Diabetes mellitus without complication ([place]) Q000111Q  . Hypertension   . OSA (obstructive sleep apnea)     Allergies  Allergen Reactions  . Codeine     Unable to Urinate     Social History   Socioeconomic History  . Marital status: Married    Spouse name: Not on file  . Number of children: 1  . Years of education: Not on file  . Highest education level: Not on file  Occupational History  . Occupation: Gaffer  Social Needs  . Financial resource strain: Not on file  . Food insecurity    Worry: Not on file    Inability: Not on file  . Transportation needs    Medical: Not on file    Non-medical: Not on file  Tobacco Use  . Smoking status: Former Smoker    Packs/day: 0.50    Quit date: 11/25/2018    Years since quitting: 0.0  . Smokeless tobacco: Never Used  . Tobacco comment: down to 3 cigarettes per day  Substance and Sexual Activity  . Alcohol use: Yes    Comment: socially  . Drug use: No  . Sexual activity: Not on file  Lifestyle  . Physical activity    Days per week: Not on file    Minutes per session: Not on file  . Stress: Not on file  Relationships  . Social Herbalist on phone: Not on file    Gets together: Not on file    Attends religious service: Not on file    Active member of club or organization: Not on file    Attends meetings of clubs or organizations: Not on file    Relationship status: Not on file  Other Topics Concern  . Not on file  Social History Narrative  . Not on file    Today's Vitals   12/26/18 0701  BP: (!) 160/95  Pulse: 90  Resp: 16  Temp: 97.6 F (36.4 C)  TempSrc: Temporal  SpO2: 95%  Weight: (!) 333 lb (151 kg)  Height: 6\' 2"  (1.88 m)    Body mass index is 42.75 kg/m.   Wt Readings from Last 3 Encounters:  12/26/18 (!) 333 lb (151 kg)  11/25/17 (!) 303 lb 6 oz (137.6 kg)  10/14/17 (!) 302 lb (137 kg)     Physical Exam  Nursing note and vitals reviewed. Constitutional: She is oriented to person, place, and time. She appears well-developed. No distress.  HENT:  Head: Normocephalic and atraumatic.  Mouth/Throat: Oropharynx is clear and moist and mucous membranes are normal.  Eyes: Pupils are equal, round, and reactive to light. Conjunctivae are normal.  Cardiovascular: Normal rate and regular rhythm.  No murmur heard. Pulses:      Dorsalis pedis pulses are 2+ on the right side, and 2+ on the left side.   Respiratory: Effort normal and breath sounds normal. No respiratory distress.  GI: Soft. She exhibits no mass. There is no hepatomegaly. There is no tenderness.  Musculoskeletal: She exhibits trace pitting LE edema, bilateral. Lymphadenopathy:    She has no cervical adenopathy.  Neurological: She is alert and oriented to person, place, and time. She has normal strength. No cranial nerve deficit. Gait normal.  Skin: Skin is warm. No rash noted. No erythema.  Psychiatric: She has a normal mood and affect.  Well  groomed, good eye contact.    ASSESSMENT AND PLAN:  Monica [name] was seen today for vaginal itching and follow-up.  Diagnoses and all orders for this visit: Orders Placed This Encounter  Procedures  . Flu Vaccine QUAD 36+ mos IM  . Microalbumin / creatinine urine ratio    Lab Results  Component Value Date   HGBA1C 7.6 (H) 12/26/2018   Lab Results  Component Value Date   MICROALBUR 1.0 12/26/2018   Lab Results  Component Value Date   CREATININE 0.76 12/26/2018   BUN 13 12/26/2018   NA 138 12/26/2018   K 4.0 12/26/2018   CL 105 12/26/2018   CO2 25 12/26/2018    Need for immunization against influenza -     Flu Vaccine QUAD 36+ mos IM  Bilateral lower extremity edema Possible etiologies discussed. ? Vein disease. LE elevation a few times during the day and/or compression stocking may help.  Type 2 diabetes mellitus with other specified complication ([place]) 123456 is not at goal. Synjardy XR discontinued because side effects. No changes in Lantus, continue 15 units daily.  Victoza added today, recommend titration dose from 0.6 mg to 1.8 mg as tolerated. Regular exercise and healthy diet with avoidance of added sugar food intake is an important part of treatment and recommended. Annual eye exam (she is due), periodic dental and foot care recommended. F/U in 4 months   Hypertension, essential, benign Problem is not well controlled. We discussed possible  complications of elevated BP. Continue low-salt diet. Continue amlodipine 5 mg daily. Losartan HCTZ discontinued today.  Benicar HCT 40-25 mg added today. Continue monitoring BP regularly. Recommend bleeding her BP monitor next visit, appropriate technique discussed. She needs to arrange eye exam. Follow-up in 4 months, before if needed.  Morbid obesity ([place]) She has already lost some weight, according to her scale. We discussed benefits of wt loss as well as adverse effects of obesity. Consistency with healthy diet and physical activity recommended.    Return in about 4 months (around 04/25/2019).   Marshia Tropea Martinique, MD [facility]. [place] office.

## 2018-12-26 NOTE — Patient Instructions (Signed)
A few things to remember from today's visit:   Type 2 diabetes mellitus with other specified complication, with long-term current use of insulin ([place]) - Plan: Microalbumin / creatinine urine ratio  Hypertension, essential, benign  Losartan and hydrochlorothiazide discontinued. Banicar HCT added. Victoza added.  Congratulations for quitting smoking!!!   Please be sure medication list is accurate. If a new problem present, please set up appointment sooner than planned today.

## 2018-12-26 NOTE — Telephone Encounter (Signed)
Will be discussed at appointment

## 2018-12-26 NOTE — Assessment & Plan Note (Addendum)
HgA1C is not at goal. Synjardy XR discontinued because side effects. No changes in Lantus, continue 15 units daily.  Victoza added today, recommend titration dose from 0.6 mg to 1.8 mg as tolerated. Regular exercise and healthy diet with avoidance of added sugar food intake is an important part of treatment and recommended. Annual eye exam (she is due), periodic dental and foot care recommended. F/U in 4 months

## 2018-12-26 NOTE — Assessment & Plan Note (Signed)
She has already lost some weight, according to her scale. We discussed benefits of wt loss as well as adverse effects of obesity. Consistency with healthy diet and physical activity recommended.

## 2018-12-28 ENCOUNTER — Encounter: Payer: Self-pay | Admitting: Family Medicine

## 2018-12-28 MED ORDER — BASAGLAR KWIKPEN 100 UNIT/ML ~~LOC~~ SOPN: 15.0000 [IU] | PEN_INJECTOR | Freq: Every day | SUBCUTANEOUS | 3 refills | Status: DC

## 2018-12-29 ENCOUNTER — Encounter: Payer: Self-pay | Admitting: Family Medicine

## 2019-01-02 MED ORDER — ONETOUCH DELICA LANCETS 33G MISC: 1 refills | Status: DC

## 2019-01-02 MED ORDER — GLUCOSE BLOOD VI STRP: ORAL_STRIP | 1 refills | Status: DC

## 2019-01-02 NOTE — Addendum Note (Signed)
Addended by: Rebecca Eaton on: 01/02/2019 07:01 AM   Modules accepted: Orders

## 2019-01-19 ENCOUNTER — Other Ambulatory Visit: Payer: Self-pay | Admitting: Family Medicine

## 2019-01-19 DIAGNOSIS — Z1231 Encounter for screening mammogram for malignant neoplasm of breast: Secondary | ICD-10-CM

## 2019-02-10 ENCOUNTER — Ambulatory Visit
Admission: RE | Admit: 2019-02-10 | Discharge: 2019-02-10 | Disposition: A | Discharge status: Home / Self Care | Payer: BC Managed Care – PPO | Source: Ambulatory Visit

## 2019-02-10 ENCOUNTER — Other Ambulatory Visit: Payer: Self-pay

## 2019-02-10 DIAGNOSIS — Z1231 Encounter for screening mammogram for malignant neoplasm of breast: Secondary | ICD-10-CM

## 2019-02-20 ENCOUNTER — Encounter: Payer: Self-pay | Admitting: Family Medicine

## 2019-02-23 MED ORDER — PEN NEEDLES 32G X 4 MM MISC: 1.0000 "pen " | Freq: Two times a day (BID) | 3 refills | Status: DC

## 2019-03-24 ENCOUNTER — Other Ambulatory Visit: Payer: Self-pay

## 2019-03-24 DIAGNOSIS — R053 Chronic cough: Secondary | ICD-10-CM

## 2019-03-24 DIAGNOSIS — J309 Allergic rhinitis, unspecified: Secondary | ICD-10-CM

## 2019-03-24 DIAGNOSIS — R05 Cough: Secondary | ICD-10-CM

## 2019-03-24 DIAGNOSIS — R0981 Nasal congestion: Secondary | ICD-10-CM

## 2019-03-24 MED ORDER — ALBUTEROL SULFATE HFA 108 (90 BASE) MCG/ACT IN AERS: INHALATION_SPRAY | RESPIRATORY_TRACT | 1 refills | Status: DC

## 2019-03-24 MED ORDER — FLUTICASONE PROPIONATE 50 MCG/ACT NA SUSP: NASAL | 1 refills | Status: DC

## 2019-03-24 MED ORDER — MONTELUKAST SODIUM 10 MG PO TABS: 10.0000 mg | ORAL_TABLET | Freq: Every day | ORAL | 1 refills | Status: DC

## 2019-04-17 ENCOUNTER — Encounter: Payer: Self-pay | Admitting: Family Medicine

## 2019-04-17 ENCOUNTER — Telehealth (INDEPENDENT_AMBULATORY_CARE_PROVIDER_SITE_OTHER): Payer: BC Managed Care – PPO | Admitting: Family Medicine

## 2019-04-17 ENCOUNTER — Encounter: Payer: Self-pay | Admitting: Internal Medicine

## 2019-04-17 VITALS — Ht 74.0 in

## 2019-04-17 DIAGNOSIS — R1310 Dysphagia, unspecified: Secondary | ICD-10-CM | POA: Diagnosis not present

## 2019-04-17 DIAGNOSIS — J019 Acute sinusitis, unspecified: Secondary | ICD-10-CM

## 2019-04-17 DIAGNOSIS — J309 Allergic rhinitis, unspecified: Secondary | ICD-10-CM | POA: Diagnosis not present

## 2019-04-17 DIAGNOSIS — Z800 Family history of malignant neoplasm of digestive organs: Secondary | ICD-10-CM

## 2019-04-17 DIAGNOSIS — J069 Acute upper respiratory infection, unspecified: Secondary | ICD-10-CM | POA: Diagnosis not present

## 2019-04-17 MED ORDER — AMOXICILLIN-POT CLAVULANATE 875-125 MG PO TABS: 1.0000 | ORAL_TABLET | Freq: Two times a day (BID) | ORAL | 0 refills | Status: AC

## 2019-04-17 MED ORDER — PREDNISONE 20 MG PO TABS: 40.0000 mg | ORAL_TABLET | Freq: Every day | ORAL | 0 refills | Status: AC

## 2019-04-17 MED ORDER — LEVOCETIRIZINE DIHYDROCHLORIDE 5 MG PO TABS: 5.0000 mg | ORAL_TABLET | Freq: Every evening | ORAL | 2 refills | Status: DC

## 2019-04-17 MED ORDER — MOMETASONE FUROATE 50 MCG/ACT NA SUSP: 2.0000 | Freq: Every day | NASAL | 3 refills | Status: DC

## 2019-04-17 NOTE — Progress Notes (Signed)
Virtual Visit via Video Note   I connected with Monica [name] on 04/17/19 by a video enabled telemedicine application and verified that I am speaking with the correct person using two identifiers.  Location patient: home Location provider:work office Persons participating in the virtual visit: patient, provider  I discussed the limitations of evaluation and management by telemedicine and the availability of in person appointments. The patient expressed understanding and agreed to proceed.  Chief Complaint  Patient presents with  . Sinus Problem    HPI: Monica Pistone is a 44 year old female with history of asthma, DM 2, hypertension, and hyperlipidemia who is complaining about a few days of upper respiratory symptoms.  7 days of frontal pressure headache, nasal congestion, rhinorrhea, postnasal drainage, and dysphonia. Reporting COVID-19 test negative. Dysphonia is exacerbated by prolonged talking.  She has not noted fever, maybe some chills. + Fatigue, body aches, and nonproductive cough. No anosmia or ageustia. + Left earache,intermittent, fullness sensation. No ear drainage. Negative for associated abdominal pain, nausea, vomiting, changes in bowel habits, or skin rash.  She has history of asthma, she has not had wheezing or dyspnea. She has been taking OTC Teraflu, which is not helping much. She has taking ibuprofen for headache. Reporting upper teeth ache.  No sick contact or recent travel. She has not been at work for a week.  Allergy rhinitis, currently she is on Flonase nasal spray and Singulair 10 mg daily. In the past she has taken cetirizine and Benadryl allergies. Symptomatic throughout the year.  FHx of colon cancer,father.  Mother recently dx with colon polyps.   ROS: See pertinent positives and negatives per HPI.  Past Medical History:  Diagnosis Date  . Diabetes mellitus without complication ([place]) Q000111Q  . Hypertension   . OSA (obstructive sleep apnea)      Past Surgical History:  Procedure Laterality Date  . ABDOMINAL HYSTERECTOMY  02/19/2006  . FOOT SURGERY     Left  . KNEE ARTHROSCOPY    . ROTATOR CUFF REPAIR Left     Family History  Problem Relation Age of Onset  . Hyperlipidemia Mother   . Stroke Mother   . Heart disease Mother   . Hypertension Mother   . Diabetes Mother   . Hyperlipidemia Father   . Heart disease Father   . Stroke Father   . Hypertension Father   . Diabetes Father   . Colon cancer Father   . Diabetes Brother   . Hypertension Maternal Grandmother   . Diabetes Mellitus II Maternal Grandmother   . Colon cancer Paternal Uncle     Social History   Socioeconomic History  . Marital status: Married    Spouse name: Not on file  . Number of children: 1  . Years of education: Not on file  . Highest education level: Not on file  Occupational History  . Occupation: Software engineer  Tobacco Use  . Smoking status: Former Smoker    Packs/day: 0.50    Quit date: 11/25/2018    Years since quitting: 0.3  . Smokeless tobacco: Never Used  . Tobacco comment: down to 3 cigarettes per day  Substance and Sexual Activity  . Alcohol use: Yes    Comment: socially  . Drug use: No  . Sexual activity: Not on file  Other Topics Concern  . Not on file  Social History Narrative  . Not on file   Social Determinants of Health   Financial Resource Strain:   . Difficulty of Paying  Living Expenses: Not on file  Food Insecurity:   . Worried About Charity fundraiser in the Last Year: Not on file  . Ran Out of Food in the Last Year: Not on file  Transportation Needs:   . Lack of Transportation (Medical): Not on file  . Lack of Transportation (Non-Medical): Not on file  Physical Activity:   . Days of Exercise per Week: Not on file  . Minutes of Exercise per Session: Not on file  Stress:   . Feeling of Stress : Not on file  Social Connections:   . Frequency of Communication with Friends and Family: Not  on file  . Frequency of Social Gatherings with Friends and Family: Not on file  . Attends Religious Services: Not on file  . Active Member of Clubs or Organizations: Not on file  . Attends Archivist Meetings: Not on file  . Marital Status: Not on file  Intimate Partner Violence:   . Fear of Current or Ex-Partner: Not on file  . Emotionally Abused: Not on file  . Physically Abused: Not on file  . Sexually Abused: Not on file    Current Outpatient Medications:  .  albuterol (VENTOLIN HFA) 108 (90 Base) MCG/ACT inhaler, INHALE 2 PUFFS INTO THE LUNGS EVERY 6 HOURS AS NEEDED FOR WHEEZING OR SHORTNESS OF BREATH, Disp: 8.5 g, Rfl: 1 .  amLODipine (NORVASC) 5 MG tablet, TAKE 1 TABLET(5 MG) BY MOUTH DAILY, Disp: 90 tablet, Rfl: 1 .  glucose blood test strip, Use to test blood sugar once daily. One Touch Verio Test Strips., Disp: 100 each, Rfl: 1 .  Insulin Glargine (BASAGLAR KWIKPEN) 100 UNIT/ML SOPN, Inject 0.15 mLs (15 Units total) into the skin at bedtime., Disp: 3 pen, Rfl: 3 .  Insulin Pen Needle (PEN NEEDLES) 32G X 4 MM MISC, 1 pen by Does not apply route 2 (two) times daily., Disp: 200 each, Rfl: 3 .  liraglutide (VICTOZA) 18 MG/3ML SOPN, Start with 0.6 mg daily and increase to 1.2 in 3 weeks, then increase to 1.8 if well tolerated., Disp: 3 pen, Rfl: 3 .  montelukast (SINGULAIR) 10 MG tablet, Take 1 tablet (10 mg total) by mouth at bedtime., Disp: 90 tablet, Rfl: 1 .  olmesartan-hydrochlorothiazide (BENICAR HCT) 40-25 MG tablet, Take 1 tablet by mouth daily., Disp: 90 tablet, Rfl: 1 .  omeprazole (PRILOSEC) 40 MG capsule, TAKE 1 CAPSULE(40 MG) BY MOUTH DAILY, Disp: 90 capsule, Rfl: 2 .  OneTouch Delica Lancets [id number] MISC, Use to test blood sugar once daily., Disp: 100 each, Rfl: 1 .  WIXELA INHUB 250-50 MCG/DOSE AEPB, INHALE 1 PUFF INTO THE LUNGS TWICE DAILY, Disp: 60 each, Rfl: 1 .  amoxicillin-clavulanate (AUGMENTIN) 875-125 MG tablet, Take 1 tablet by mouth 2 (two) times daily for  10 days., Disp: 20 tablet, Rfl: 0 .  levocetirizine (XYZAL) 5 MG tablet, Take 1 tablet (5 mg total) by mouth every evening., Disp: 30 tablet, Rfl: 2 .  mometasone (NASONEX) 50 MCG/ACT nasal spray, Place 2 sprays into the nose daily., Disp: 17 g, Rfl: 3 .  predniSONE (DELTASONE) 20 MG tablet, Take 2 tablets (40 mg total) by mouth daily with breakfast for 3 days., Disp: 6 tablet, Rfl: 0  EXAM:  VITALS per patient if applicable:Ht 6\' 2"  (1.88 m)   BMI 42.75 kg/m   GENERAL: alert, oriented, appears well and in no acute distress  HEENT: atraumatic, conjunctiva clear, no obvious abnormalities on inspection of external nose and ears Nasal congestion  and mild dysphonia.  NECK: normal movements of the head and neck  LUNGS: on inspection no signs of respiratory distress, breathing rate appears normal, no obvious gross SOB, gasping or wheezing  CV: no obvious cyanosis  Monica: moves all visible extremities without noticeable abnormality  PSYCH/NEURO: pleasant and cooperative, no obvious depression or anxiety, speech and thought processing grossly intact  ASSESSMENT AND PLAN:  Discussed the following assessment and plan:  Dysphagia, unspecified type Viral vs allergic etiology. Voice rest recommended at this time.  Allergic rhinitis, unspecified seasonality, unspecified trigger  Problem has to be well controlled. Continue Singulair 10 mg daily. Discontinue Flonase. Start Nasonex nasal spray and Xyzal 5 mg daily. Short course of prednisone has helped in the past, side effect discussed, recommend taking it with food. Saline nasal irrigations as needed. Immunology referral placed.  - Plan: predniSONE (DELTASONE) 20 MG tablet, mometasone (NASONEX) 50 MCG/ACT nasal spray, levocetirizine (XYZAL) 5 MG tablet, Ambulatory referral to Immunology  URI, acute Most likely viral etiology. Covid 19 test reported as negative. Symptomatic treatment recommended. Adequate hydration. Monitor for signs  of complications.  Acute sinusitis, recurrence not specified, unspecified location I did send antibiotic to her pharmacy by the strongly recommend not to take at this time.  If she is not any better in 4 days, she can go ahead and start medication.   - Plan: predniSONE (DELTASONE) 20 MG tablet, amoxicillin-clavulanate (AUGMENTIN) 875-125 MG tablet Explained that I do not think antibiotic will help at this time, history suggest a viral URI.  Family hx of colon cancer - Plan: Ambulatory referral to Gastroenterology   I discussed the assessment and treatment plan with the patient. The patient was provided an opportunity to ask questions and all were answered. The patient agreed with the plan and demonstrated an understanding of the instructions.   The patient was advised to call back or seek an in-person evaluation if the symptoms worsen or if the condition fails to improve as anticipated.  Return if symptoms worsen or fail to improve, for Keep next f/u appt.    Syrina Wake Martin[place], MD

## 2019-04-30 ENCOUNTER — Ambulatory Visit: Payer: BC Managed Care – PPO | Attending: Family

## 2019-04-30 DIAGNOSIS — Z23 Encounter for immunization: Secondary | ICD-10-CM

## 2019-04-30 NOTE — Progress Notes (Signed)
   Covid-19 Vaccination Clinic  Name:  Monica [name]    MRN: [id number] DOB: 1975-04-08  04/30/2019  [name] was observed post Covid-19 immunization for 15 minutes without incident. She was provided with Vaccine Information Sheet and instruction to access the V-Safe system.   [name] was instructed to call 911 with any severe reactions post vaccine: Marland Kitchen Difficulty breathing  . Swelling of face and throat  . A fast heartbeat  . A bad rash all over body  . Dizziness and weakness   Immunizations Administered    Name Date Dose VIS Date Route   Moderna COVID-19 Vaccine 04/30/2019  9:57 AM 0.5 mL 01/20/2019 Intramuscular   Manufacturer: Moderna   Lot: [id number]   [id number]

## 2019-05-10 ENCOUNTER — Other Ambulatory Visit: Payer: Self-pay | Admitting: Family Medicine

## 2019-05-10 DIAGNOSIS — K219 Gastro-esophageal reflux disease without esophagitis: Secondary | ICD-10-CM

## 2019-05-18 ENCOUNTER — Other Ambulatory Visit: Payer: Self-pay

## 2019-05-18 ENCOUNTER — Ambulatory Visit (AMBULATORY_SURGERY_CENTER): Payer: BC Managed Care – PPO | Admitting: *Deleted

## 2019-05-18 VITALS — Ht 72.0 in | Wt 308.0 lb

## 2019-05-18 DIAGNOSIS — Z01818 Encounter for other preprocedural examination: Secondary | ICD-10-CM

## 2019-05-18 DIAGNOSIS — Z800 Family history of malignant neoplasm of digestive organs: Secondary | ICD-10-CM

## 2019-05-18 MED ORDER — PEG 3350-KCL-NA BICARB-NACL 420 G PO SOLR: 4000.0000 mL | Freq: Once | ORAL | 0 refills | Status: AC

## 2019-05-18 NOTE — Progress Notes (Signed)
Pt verified name, DOB, address and insurance during PV today. Pt mailed instruction packet to included paper to complete and mail back to [facility] with addressed and stamped envelope, Emmi video, copy of consent form to read and not return, and instructions.PV completed over the phone. Pt encouraged to call with questions or issues  Pt woke with fever, body aches, chills and called for Virtual PV- I Explained to her she needs COVID test- IF she is +, she will have to RS her colon until 10-14 days WITHOUT s/s- she will call after she is tested - she has had 1 vaccine   No egg or soy allergy known to patient  No issues with past sedation with any surgeries  or procedures, no intubation problems  No diet pills per patient No home 02 use per patient  No blood thinners per patient  Pt denies issues with constipation  No A fib or A flutter  EMMI video sent to pt's e mail   Due to the COVID-19 pandemic we are asking patients to follow these guidelines. Please only bring one care partner. Please be aware that your care partner may wait in the car in the parking lot or if they feel like they will be too hot to wait in the car, they may wait in the lobby on the 4th floor. All care partners are required to wear a mask the entire time (we do not have any that we can provide them), they need to practice social distancing, and we will do a Covid check for all patient's and care partners when you arrive. Also we will check their temperature and your temperature. If the care partner waits in their car they need to stay in the parking lot the entire time and we will call them on their cell phone when the patient is ready for discharge so they can bring the car to the front of the building. Also all patient's will need to wear a mask into building.

## 2019-05-27 ENCOUNTER — Other Ambulatory Visit: Payer: Self-pay | Admitting: Internal Medicine

## 2019-05-27 ENCOUNTER — Ambulatory Visit (INDEPENDENT_AMBULATORY_CARE_PROVIDER_SITE_OTHER): Payer: BC Managed Care – PPO

## 2019-05-27 DIAGNOSIS — Z1159 Encounter for screening for other viral diseases: Secondary | ICD-10-CM

## 2019-05-28 ENCOUNTER — Encounter: Payer: Self-pay | Admitting: Internal Medicine

## 2019-05-28 LAB — SARS CORONAVIRUS 2 (TAT 6-24 HRS): SARS Coronavirus 2: NEGATIVE

## 2019-06-01 ENCOUNTER — Other Ambulatory Visit: Payer: Self-pay

## 2019-06-01 ENCOUNTER — Encounter: Payer: Self-pay | Admitting: Internal Medicine

## 2019-06-01 ENCOUNTER — Ambulatory Visit (AMBULATORY_SURGERY_CENTER): Payer: BC Managed Care – PPO | Admitting: Internal Medicine

## 2019-06-01 VITALS — BP 110/57 | HR 83 | Temp 97.5°F | Resp 16 | Ht 74.0 in | Wt 308.0 lb

## 2019-06-01 DIAGNOSIS — Z1211 Encounter for screening for malignant neoplasm of colon: Secondary | ICD-10-CM | POA: Diagnosis not present

## 2019-06-01 DIAGNOSIS — Z800 Family history of malignant neoplasm of digestive organs: Secondary | ICD-10-CM | POA: Diagnosis present

## 2019-06-01 DIAGNOSIS — D123 Benign neoplasm of transverse colon: Secondary | ICD-10-CM

## 2019-06-01 DIAGNOSIS — K635 Polyp of colon: Secondary | ICD-10-CM | POA: Diagnosis not present

## 2019-06-01 DIAGNOSIS — D125 Benign neoplasm of sigmoid colon: Secondary | ICD-10-CM

## 2019-06-01 DIAGNOSIS — D120 Benign neoplasm of cecum: Secondary | ICD-10-CM

## 2019-06-01 DIAGNOSIS — Z120 Encounter for screening for malignant neoplasm of stomach: Secondary | ICD-10-CM | POA: Diagnosis not present

## 2019-06-01 MED ORDER — SODIUM CHLORIDE 0.9 % IV SOLN: 500.0000 mL | Freq: Once | INTRAVENOUS | Status: DC

## 2019-06-01 NOTE — Patient Instructions (Signed)
  Handout on polyps given to you today   Await pathology results on polyps removed    YOU HAD AN ENDOSCOPIC PROCEDURE TODAY AT THE [facility]:   Refer to the procedure report that was given to you for any specific questions about what was found during the examination.  If the procedure report does not answer your questions, please call your gastroenterologist to clarify.  If you requested that your care partner not be given the details of your procedure findings, then the procedure report has been included in a sealed envelope for you to review at your convenience later.  YOU SHOULD EXPECT: Some feelings of bloating in the abdomen. Passage of more gas than usual.  Walking can help get rid of the air that was put into your GI tract during the procedure and reduce the bloating. If you had a lower endoscopy (such as a colonoscopy or flexible sigmoidoscopy) you may notice spotting of blood in your stool or on the toilet paper. If you underwent a bowel prep for your procedure, you may not have a normal bowel movement for a few days.  Please Note:  You might notice some irritation and congestion in your nose or some drainage.  This is from the oxygen used during your procedure.  There is no need for concern and it should clear up in a day or so.  SYMPTOMS TO REPORT IMMEDIATELY:  Following lower endoscopy (colonoscopy or flexible sigmoidoscopy):  Excessive amounts of blood in the stool  Significant tenderness or worsening of abdominal pains  Swelling of the abdomen that is new, acute  Fever of 100F or higher  For urgent or emergent issues, a gastroenterologist can be reached at any hour by calling [phone]. Do not use MyChart messaging for urgent concerns.    DIET:  We do recommend a small meal at first, but then you may proceed to your regular diet.  Drink plenty of fluids but you should avoid alcoholic beverages for 24 hours.  ACTIVITY:  You should plan to take it easy  for the rest of today and you should NOT DRIVE or use heavy machinery until tomorrow (because of the sedation medicines used during the test).    FOLLOW UP: Our staff will call the number listed on your records 48-72 hours following your procedure to check on you and address any questions or concerns that you may have regarding the information given to you following your procedure. If we do not reach you, we will leave a message.  We will attempt to reach you two times.  During this call, we will ask if you have developed any symptoms of COVID 19. If you develop any symptoms (ie: fever, flu-like symptoms, shortness of breath, cough etc.) before then, please call [phone].  If you test positive for Covid 19 in the 2 weeks post procedure, please call and report this information to us.    If any biopsies were taken you will be contacted by phone or by letter within the next 1-3 weeks.  Please call us at [phone] if you have not heard about the biopsies in 3 weeks.    SIGNATURES/CONFIDENTIALITY: You and/or your care partner have signed paperwork which will be entered into your electronic medical record.  These signatures attest to the fact that that the information above on your After Visit Summary has been reviewed and is understood.  Full responsibility of the confidentiality of this discharge information lies with you and/or your care-partner.    your care-partner. 

## 2019-06-01 NOTE — Progress Notes (Signed)
Pt's states no medical or surgical changes since previsit or office visit. VS by DT. Temp by LC.

## 2019-06-01 NOTE — Op Note (Signed)
[facility] Patient Name: Monica [name] Procedure Date: 06/01/2019 7:52 AM MRN: [id number] Endoscopist: Jerene Bears , MD Age: 44 Referring MD:  Date of Birth: 04-Mar-1975 Gender: Female Account #: [id number] Procedure:                Colonoscopy Indications:              Screening in patient at increased risk: Family                            history of 1st-degree relative with colorectal                            cancer before age 46 years, This is the patient's                            first colonoscopy Medicines:                Monitored Anesthesia Care Procedure:                Pre-Anesthesia Assessment:                           - Prior to the procedure, a History and Physical                            was performed, and patient medications and                            allergies were reviewed. The patient's tolerance of                            previous anesthesia was also reviewed. The risks                            and benefits of the procedure and the sedation                            options and risks were discussed with the patient.                            All questions were answered, and informed consent                            was obtained. Prior Anticoagulants: The patient has                            taken no previous anticoagulant or antiplatelet                            agents. ASA Grade Assessment: II - A patient with                            mild systemic disease. After reviewing the risks  and benefits, the patient was deemed in                            satisfactory condition to undergo the procedure.                           After obtaining informed consent, the colonoscope                            was passed under direct vision. Throughout the                            procedure, the patient's blood pressure, pulse, and                            oxygen saturations were monitored continuously. The                             Colonoscope was introduced through the anus and                            advanced to the cecum, identified by appendiceal                            orifice and ileocecal valve. The colonoscopy was                            performed without difficulty. The patient tolerated                            the procedure well. The quality of the bowel                            preparation was good. The ileocecal valve,                            appendiceal orifice, and rectum were photographed. Scope In: 8:00:31 AM Scope Out: 8:18:05 AM Scope Withdrawal Time: 0 hours 13 minutes 51 seconds  Total Procedure Duration: 0 hours 17 minutes 34 seconds  Findings:                 The digital rectal exam was normal.                           A 3 mm polyp was found in the cecum. The polyp was                            sessile. The polyp was removed with a cold snare.                            Resection and retrieval were complete.                           A 3 mm polyp was found in  the transverse colon. The                            polyp was sessile. The polyp was removed with a                            cold snare. Resection and retrieval were complete.                           A 7 mm polyp was found in the sigmoid colon. The                            polyp was sessile. The polyp was removed with a                            cold snare. Resection and retrieval were complete.                           The exam was otherwise without abnormality on                            direct and retroflexion views. Complications:            No immediate complications. Estimated Blood Loss:     Estimated blood loss was minimal. Impression:               - One 3 mm polyp in the cecum, removed with a cold                            snare. Resected and retrieved.                           - One 3 mm polyp in the transverse colon, removed                            with a cold  snare. Resected and retrieved.                           - One 7 mm polyp in the sigmoid colon, removed with                            a cold snare. Resected and retrieved.                           - The examination was otherwise normal on direct                            and retroflexion views. Recommendation:           - Patient has a contact number available for                            emergencies. The signs and symptoms of potential  delayed complications were discussed with the                            patient. Return to normal activities tomorrow.                            Written discharge instructions were provided to the                            patient.                           - Resume previous diet.                           - Continue present medications.                           - Await pathology results.                           - Repeat colonoscopy is recommended for                            screening/surveillance. The colonoscopy date will                            be determined after pathology results from today's                            exam become available for review. Jerene Bears, MD 06/01/2019 8:21:39 AM This report has been signed electronically.

## 2019-06-01 NOTE — Progress Notes (Signed)
To PACU, VSS. Report to [email or web address] 

## 2019-06-02 ENCOUNTER — Ambulatory Visit: Payer: BC Managed Care – PPO | Attending: Family

## 2019-06-02 DIAGNOSIS — Z23 Encounter for immunization: Secondary | ICD-10-CM

## 2019-06-02 NOTE — Progress Notes (Signed)
   Covid-19 Vaccination Clinic  Name:  Monica [name]    MRN: [id number] DOB: 1975-10-28  06/02/2019  Monica [name] was observed post Covid-19 immunization for 15 minutes without incident. She was provided with Vaccine Information Sheet and instruction to access the V-Safe system.   Monica [name] was instructed to call 911 with any severe reactions post vaccine: Marland Kitchen Difficulty breathing  . Swelling of face and throat  . A fast heartbeat  . A bad rash all over body  . Dizziness and weakness   Immunizations Administered    Name Date Dose VIS Date Route   Moderna COVID-19 Vaccine 06/02/2019 10:19 AM 0.5 mL 01/20/2019 Intramuscular   Manufacturer: Moderna   Lot: [id number]   CrystalBE:[id number]

## 2019-06-03 ENCOUNTER — Telehealth: Payer: Self-pay | Admitting: *Deleted

## 2019-06-03 NOTE — Telephone Encounter (Signed)
Left message for post procedure follow up call. Will attempt to call back later today.

## 2019-06-03 NOTE — Telephone Encounter (Signed)
  Follow up Call-  Call back number 06/01/2019  Post procedure Call Back phone  # [phone]  Permission to leave phone message Yes  Some recent data might be hidden     Patient questions:  Message left to all [place] if necessary.

## 2019-06-05 ENCOUNTER — Ambulatory Visit: Payer: BC Managed Care – PPO | Admitting: Allergy

## 2019-06-08 ENCOUNTER — Encounter: Payer: Self-pay | Admitting: Internal Medicine

## 2019-06-22 ENCOUNTER — Other Ambulatory Visit: Payer: Self-pay | Admitting: Family Medicine

## 2019-06-29 ENCOUNTER — Other Ambulatory Visit: Payer: Self-pay | Admitting: Family Medicine

## 2019-06-29 DIAGNOSIS — I10 Essential (primary) hypertension: Secondary | ICD-10-CM

## 2019-07-13 ENCOUNTER — Other Ambulatory Visit: Payer: Self-pay

## 2019-07-13 ENCOUNTER — Encounter: Payer: Self-pay | Admitting: Family Medicine

## 2019-07-13 ENCOUNTER — Ambulatory Visit (INDEPENDENT_AMBULATORY_CARE_PROVIDER_SITE_OTHER): Payer: BC Managed Care – PPO | Admitting: Family Medicine

## 2019-07-13 VITALS — BP 100/70 | HR 101 | Temp 97.4°F | Resp 16 | Ht 74.0 in | Wt 325.0 lb

## 2019-07-13 DIAGNOSIS — R0602 Shortness of breath: Secondary | ICD-10-CM | POA: Diagnosis not present

## 2019-07-13 DIAGNOSIS — Z794 Long term (current) use of insulin: Secondary | ICD-10-CM

## 2019-07-13 DIAGNOSIS — M542 Cervicalgia: Secondary | ICD-10-CM | POA: Diagnosis not present

## 2019-07-13 DIAGNOSIS — R202 Paresthesia of skin: Secondary | ICD-10-CM | POA: Diagnosis not present

## 2019-07-13 DIAGNOSIS — E1169 Type 2 diabetes mellitus with other specified complication: Secondary | ICD-10-CM | POA: Diagnosis not present

## 2019-07-13 DIAGNOSIS — I10 Essential (primary) hypertension: Secondary | ICD-10-CM

## 2019-07-13 LAB — POCT GLYCOSYLATED HEMOGLOBIN (HGB A1C): Hemoglobin A1C: 6.9 % — AB (ref 4.0–5.6)

## 2019-07-13 MED ORDER — TIZANIDINE HCL 4 MG PO TABS: 4.0000 mg | ORAL_TABLET | Freq: Three times a day (TID) | ORAL | 1 refills | Status: DC | PRN

## 2019-07-13 MED ORDER — PREDNISONE 20 MG PO TABS: ORAL_TABLET | ORAL | 0 refills | Status: AC

## 2019-07-13 NOTE — Assessment & Plan Note (Signed)
HgA1C at goal. No changes in current management. Regular exercise and healthy diet with avoidance of added sugar food intake is an important part of treatment and recommended. Annual eye exam and foot care. F/U in 5-6 months

## 2019-07-13 NOTE — Patient Instructions (Addendum)
A few things to remember from today's visit:   Increase basaglar to 20 U while on prednisone. Take prednisone with breakfast. If tingling is not gone in 4-6 weeks we will need a cervical; MRI.  If you need refills please call your pharmacy. Do not use My Chart to request refills or for acute issues that need immediate attention.    Please be sure medication list is accurate. If a new problem present, please set up appointment sooner than planned today.

## 2019-07-13 NOTE — Progress Notes (Signed)
Chief Complaint  Patient presents with  . Tingling    started 2 weeks ago starting in left elbow and radiating to under arm  . Shortness of Breath    SOB when going up stairs   . Facial Swelling    swelling in left side of neck   HPI: Monica [name] is a 44 y.o. female, who is here today with above complaint.  Tingling like sensation affecting left shoulder, medial aspect of arm.  No weakness, joint edema/erythema,or skin rash. 10/2009 cervical MRI: Right-sided neck pain, mild dull pain, no radiated. Pain is exacerbated by movement,no limitation of ROM. Cervical MRI done in 10/2009:Mild degenerative changes consisting of disc bulges, mild facet hypertrophy, and uncovertebral hypertrophy.  Borderline/mild spinal stenosis at C5-C6.  Mild to moderate left greater than right C4 foraminal stenosis and mild right C8 foraminal stenosis.   SOB when going up stairs for a few months, no associated CP,palpitations,or diaphoresis. She is exercising regularly , insanity 4 times per week, no symptoms with exertion. No associated cough or wheezing.  HTN: Dx'ed in 2017. She is on Amlodipine 5 mg daily and Olmesartan-HCTZ 40-25 mg daily Negative for severe/frequent headache, visual changes, claudication, focal weakness, or edema.   Lab Results  Component Value Date   CREATININE 0.76 12/26/2018   BUN 13 12/26/2018   NA 138 12/26/2018   K 4.0 12/26/2018   CL 105 12/26/2018   CO2 25 12/26/2018   DM II: Dx;ed 04/24/16. She is on Victoza 1.8 mg [place] daily and Basaglar 15 U daily. BS's "at range." Negative for polydipsia,polyuria, or polyphagia.  Lab Results  Component Value Date   HGBA1C 7.6 (H) 12/26/2018  Started smoking again. She has been under stress, she has a new job.  Review of Systems  Constitutional: Negative for activity change, appetite change and fever.  HENT: Negative for mouth sores, nosebleeds and sore throat.   Respiratory: Negative for cough and wheezing.     Gastrointestinal: Negative for abdominal pain, nausea and vomiting.       Negative for changes in bowel habits.  Genitourinary: Negative for decreased urine volume and hematuria.  Musculoskeletal: Negative for gait problem.  Allergic/Immunologic: Positive for environmental allergies.  Neurological: Negative for syncope and facial asymmetry.  Rest see pertinent positives and negatives per HPI.  Current Outpatient Medications on File Prior to Visit  Medication Sig Dispense Refill  . albuterol (VENTOLIN HFA) 108 (90 Base) MCG/ACT inhaler INHALE 2 PUFFS INTO THE LUNGS EVERY 6 HOURS AS NEEDED FOR WHEEZING OR SHORTNESS OF BREATH 8.5 g 1  . amLODipine (NORVASC) 5 MG tablet TAKE 1 TABLET(5 MG) BY MOUTH DAILY 90 tablet 1  . glucose blood test strip Use to test blood sugar once daily. One Touch Verio Test Strips. 100 each 1  . Insulin Glargine (BASAGLAR KWIKPEN) 100 UNIT/ML SOPN Inject 0.15 mLs (15 Units total) into the skin at bedtime. 3 pen 3  . Insulin Pen Needle (PEN NEEDLES) 32G X 4 MM MISC 1 pen by Does not apply route 2 (two) times daily. 200 each 3  . levocetirizine (XYZAL) 5 MG tablet Take 1 tablet (5 mg total) by mouth every evening. 30 tablet 2  . liraglutide (VICTOZA) 18 MG/3ML SOPN Start with 0.6 mg daily and increase to 1.2 in 3 weeks, then increase to 1.8 if well tolerated. 3 pen 3  . mometasone (NASONEX) 50 MCG/ACT nasal spray Place 2 sprays into the nose daily. 17 g 3  . montelukast (SINGULAIR) 10  MG tablet Take 1 tablet (10 mg total) by mouth at bedtime. 90 tablet 1  . olmesartan-hydrochlorothiazide (BENICAR HCT) 40-25 MG tablet TAKE 1 TABLET BY MOUTH DAILY 90 tablet 1  . omeprazole (PRILOSEC) 40 MG capsule TAKE 1 CAPSULE(40 MG) BY MOUTH DAILY 90 capsule 2  . OneTouch Delica Lancets 99991111 MISC Use to test blood sugar once daily. 100 each 1  . PRESCRIPTION MEDICATION Allergy Nose Spray Daily    . WIXELA INHUB 250-50 MCG/DOSE AEPB INHALE 1 PUFF INTO THE LUNGS TWICE DAILY 60 each 1   No  current facility-administered medications on file prior to visit.    Past Medical History:  Diagnosis Date  . Allergy   . Anemia   . Asthma   . Diabetes mellitus without complication ([place]) [id number]  . GERD (gastroesophageal reflux disease)   . Hypertension   . OSA (obstructive sleep apnea)   . Sleep apnea    no cpap - told no OSA after told did so unsure if + or not    Allergies  Allergen Reactions  . Codeine     Unable to Urinate     Social History   Socioeconomic History  . Marital status: Married    Spouse name: Not on file  . Number of children: 1  . Years of education: Not on file  . Highest education level: Not on file  Occupational History  . Occupation: Software engineer  Tobacco Use  . Smoking status: Current Every Day Smoker    Packs/day: 0.50    Last attempt to quit: 11/25/2018    Years since quitting: 0.6  . Smokeless tobacco: Never Used  . Tobacco comment: down to 3 cigarettes per day  Substance and Sexual Activity  . Alcohol use: Yes    Comment: socially  . Drug use: No  . Sexual activity: Not on file  Other Topics Concern  . Not on file  Social History Narrative  . Not on file   Social Determinants of Health   Financial Resource Strain:   . Difficulty of Paying Living Expenses:   Food Insecurity:   . Worried About Charity fundraiser in the Last Year:   . Arboriculturist in the Last Year:   Transportation Needs:   . Film/video editor (Medical):   Marland Kitchen Lack of Transportation (Non-Medical):   Physical Activity:   . Days of Exercise per Week:   . Minutes of Exercise per Session:   Stress:   . Feeling of Stress :   Social Connections:   . Frequency of Communication with Friends and Family:   . Frequency of Social Gatherings with Friends and Family:   . Attends Religious Services:   . Active Member of Clubs or Organizations:   . Attends Archivist Meetings:   Marland Kitchen Marital Status:     Vitals:   07/13/19 1555  BP:  100/70  Pulse: (!) 101  Resp: 16  Temp: (!) 97.4 F (36.3 C)  SpO2: 97%   Wt Readings from Last 3 Encounters:  07/13/19 (!) 325 lb (147.4 kg)  06/01/19 (!) 308 lb (139.7 kg)  05/18/19 (!) 308 lb (139.7 kg)   Body mass index is 41.73 kg/m.  Physical Exam  Nursing note and vitals reviewed. Constitutional: She is oriented to person, place, and time. She appears well-developed. No distress.  HENT:  Head: Normocephalic and atraumatic.  Mouth/Throat: Oropharynx is clear and moist and mucous membranes are normal.  Eyes: Pupils are equal,  round, and reactive to light. Conjunctivae are normal.  Cardiovascular: Normal rate and regular rhythm.  No murmur heard. Pulses:      Dorsalis pedis pulses are 2+ on the right side and 2+ on the left side.  HR by my count 96/min.  Respiratory: Effort normal and breath sounds normal. No respiratory distress. She exhibits no tenderness.  GI: Soft. She exhibits no mass. There is no hepatomegaly. There is no abdominal tenderness.  Musculoskeletal:        General: No edema.     Cervical back: Spasms and tenderness present. No bony tenderness. Normal range of motion.       Back:  Lymphadenopathy:    She has no cervical adenopathy.  Neurological: She is alert and oriented to person, place, and time. She has normal strength. No cranial nerve deficit. Gait normal.  Skin: Skin is warm. No rash noted. No erythema.  Psychiatric: She has a normal mood and affect.  Well groomed, good eye contact.    ASSESSMENT AND PLAN:  Ms. Braidyn was seen today for tingling, shortness of breath and facial swelling.  Diagnoses and all orders for this visit: Orders Placed This Encounter  Procedures  . Basic metabolic panel  . POC HgB A1c  . EKG 12-Lead   Lab Results  Component Value Date   HGBA1C 6.9 (A) 07/13/2019    Tingling of left upper extremity ? Radicular pain. She agrees with Prednisone taper, side effects discussed. Instructed to increase Basaglar to  20 U x 2-3 weeks and to take med with breakfast. If not resolve in 4-6 weeks cervical MRI and /or ortho referral will be necessary.  -     predniSONE (DELTASONE) 20 MG tablet; 3 tabs for 3 days, 2 tabs for 3 days, 1 tabs for 3 days, and 1/2 tab for 3 days. Take tables together with breakfast.  Neck pain on right side Local heat and massage. Topical icy hot or asper cream on affected area. I do nit think imaging is needed at this time. Side effects of muscle relaxants.  -     tiZANidine (ZANAFLEX) 4 MG tablet; Take 1 tablet (4 mg total) by mouth every 8 (eight) hours as needed for muscle spasms.  SOB (shortness of breath) We discussed possible etiologies. She has no symptoms with intense physical activity. Clearly instructed about warning signs.  EKG today: sinus arrhythmia, normal axis and intervals. Unspecific T wave changes.When compared with EKG in 10/2010 no significant differences.  -     EKG 12-Lead  Type 2 diabetes mellitus with other specified complication (HCC) 123456 at goal. No changes in current management. Regular exercise and healthy diet with avoidance of added sugar food intake is an important part of treatment and recommended. Annual eye exam and foot care. F/U in 5-6 months   Hypertension, essential, benign BP adequately controlled. Continue monitoring BP regularly. No changes in current management. Continue low salt diet.  Morbid obesity ([place]) Since 12/2018 (last time wt in the office) she has lost about 8 Lb. Continue regular exercise and a healthful diet. Caution with exercise intensity, may increase risk of injuries.    Return in about 6 months (around 01/13/2020).   Kiala Faraj G. [place], MD  [facility]. [place] office.  Discharge Instructions   None    A few things to remember from today's visit:  Increase basaglar to 20 U while on prednisone. Take prednisone with breakfast. If tingling is not gone in 4-6 weeks we will need a  cervical; MRI.  If you need refills please call your pharmacy. Do not use My Chart to request refills or for acute issues that need immediate attention.    Please be sure medication list is accurate. If a new problem present, please set up appointment sooner than planned today.

## 2019-07-13 NOTE — Assessment & Plan Note (Signed)
BP adequately controlled. Continue monitoring BP regularly. No changes in current management. Continue low salt diet.

## 2019-07-13 NOTE — Assessment & Plan Note (Signed)
Since 12/2018 (last time wt in the office) she has lost about 8 Lb. Continue regular exercise and a healthful diet. Caution with exercise intensity, may increase risk of injuries.

## 2019-07-21 ENCOUNTER — Other Ambulatory Visit: Payer: Self-pay | Admitting: Family Medicine

## 2019-07-21 DIAGNOSIS — J309 Allergic rhinitis, unspecified: Secondary | ICD-10-CM

## 2019-07-23 ENCOUNTER — Telehealth (INDEPENDENT_AMBULATORY_CARE_PROVIDER_SITE_OTHER): Payer: BC Managed Care – PPO | Admitting: Family Medicine

## 2019-07-23 ENCOUNTER — Encounter: Payer: Self-pay | Admitting: Family Medicine

## 2019-07-23 DIAGNOSIS — E1165 Type 2 diabetes mellitus with hyperglycemia: Secondary | ICD-10-CM

## 2019-07-23 DIAGNOSIS — M542 Cervicalgia: Secondary | ICD-10-CM | POA: Diagnosis not present

## 2019-07-23 DIAGNOSIS — R739 Hyperglycemia, unspecified: Secondary | ICD-10-CM | POA: Diagnosis not present

## 2019-07-23 NOTE — Progress Notes (Signed)
Virtual Visit via Video Note  I connected with Monica [name]  on 07/23/19 at  4:20 PM EDT by a video enabled telemedicine application and verified that I am speaking with the correct person using two identifiers.  Location patient: home, [place] Location provider:work or home office Persons participating in the virtual visit: patient, provider  I discussed the limitations of evaluation and management by telemedicine and the availability of in person appointments. The patient expressed understanding and agreed to proceed.   HPI:  Acute visit for Hyperglycemia.  -She was started on oral prednisone 9 days ago for neck issues and reports blood sugar went up -has a history of poorly controlled diabetes -BS was 454 today, she went to a doctor at her [employer] where she works and was told it was likely from a combination of prednisone and a fast food lunch. The doctor suggested to increase nighttime insulin from 15-20 units, eat low carb, drink fluids and let her PCP know -she is almost finished with the prednisone, was on a 12 day taper (on 10mg  for the next few days to finish), neck is doing a bit better -BS now 487 -feels ok other than had some increase sweating and urinating the last few days -denies fevers, and issues with hypoglycemia in the past, malaise, abd pain, dysuria, NV, dizziness  ROS: See pertinent positives and negatives per HPI.  Past Medical History:  Diagnosis Date  . Allergy   . Anemia   . Asthma   . Diabetes mellitus without complication ([place]) [id number]  . GERD (gastroesophageal reflux disease)   . Hypertension   . OSA (obstructive sleep apnea)   . Sleep apnea    no cpap - told no OSA after told did so unsure if + or not     Past Surgical History:  Procedure Laterality Date  . ABDOMINAL HYSTERECTOMY  02/19/2006  . BICEPS TENDON REPAIR Bilateral   . FOOT SURGERY     Left  . KNEE ARTHROSCOPY Bilateral   . ROTATOR CUFF REPAIR Bilateral     Family History  Problem  Relation Age of Onset  . Hyperlipidemia Mother   . Stroke Mother   . Heart disease Mother   . Hypertension Mother   . Diabetes Mother   . Colon polyps Mother        TA+  . Hyperlipidemia Father   . Heart disease Father   . Stroke Father   . Hypertension Father   . Diabetes Father   . Colon cancer Father        Dx'd in his 70's   . Diabetes Brother   . Hypertension Maternal Grandmother   . Diabetes Mellitus II Maternal Grandmother   . Colon cancer Paternal Uncle   . Esophageal cancer Neg Hx   . Rectal cancer Neg Hx   . Stomach cancer Neg Hx     SOCIAL HX: see hpi   Current Outpatient Medications:  .  albuterol (VENTOLIN HFA) 108 (90 Base) MCG/ACT inhaler, INHALE 2 PUFFS INTO THE LUNGS EVERY 6 HOURS AS NEEDED FOR WHEEZING OR SHORTNESS OF BREATH, Disp: 8.5 g, Rfl: 1 .  amLODipine (NORVASC) 5 MG tablet, TAKE 1 TABLET(5 MG) BY MOUTH DAILY, Disp: 90 tablet, Rfl: 1 .  glucose blood test strip, Use to test blood sugar once daily. One Touch Verio Test Strips., Disp: 100 each, Rfl: 1 .  Insulin Glargine (BASAGLAR KWIKPEN) 100 UNIT/ML SOPN, Inject 0.15 mLs (15 Units total) into the skin at bedtime., Disp: 3 pen, Rfl:  3 .  Insulin Pen Needle (PEN NEEDLES) 32G X 4 MM MISC, 1 pen by Does not apply route 2 (two) times daily., Disp: 200 each, Rfl: 3 .  levocetirizine (XYZAL) 5 MG tablet, TAKE 1 TABLET(5 MG) BY MOUTH EVERY EVENING, Disp: 30 tablet, Rfl: 2 .  liraglutide (VICTOZA) 18 MG/3ML SOPN, Start with 0.6 mg daily and increase to 1.2 in 3 weeks, then increase to 1.8 if well tolerated., Disp: 3 pen, Rfl: 3 .  mometasone (NASONEX) 50 MCG/ACT nasal spray, Place 2 sprays into the nose daily., Disp: 17 g, Rfl: 3 .  montelukast (SINGULAIR) 10 MG tablet, Take 1 tablet (10 mg total) by mouth at bedtime., Disp: 90 tablet, Rfl: 1 .  olmesartan-hydrochlorothiazide (BENICAR HCT) 40-25 MG tablet, TAKE 1 TABLET BY MOUTH DAILY, Disp: 90 tablet, Rfl: 1 .  omeprazole (PRILOSEC) 40 MG capsule, TAKE 1  CAPSULE(40 MG) BY MOUTH DAILY, Disp: 90 capsule, Rfl: 2 .  OneTouch Delica Lancets [id number] MISC, Use to test blood sugar once daily., Disp: 100 each, Rfl: 1 .  PRESCRIPTION MEDICATION, Allergy Nose Spray Daily, Disp: , Rfl:  .  tiZANidine (ZANAFLEX) 4 MG tablet, Take 1 tablet (4 mg total) by mouth every 8 (eight) hours as needed for muscle spasms., Disp: 30 tablet, Rfl: 1 .  WIXELA INHUB 250-50 MCG/DOSE AEPB, INHALE 1 PUFF INTO THE LUNGS TWICE DAILY, Disp: 60 each, Rfl: 1  EXAM:  VITALS per patient if applicable:  GENERAL: alert, oriented, appears well and in no acute distress  HEENT: atraumatic, conjunttiva clear, no obvious abnormalities on inspection of external nose and ears  NECK: normal movements of the head and neck  LUNGS: on inspection no signs of respiratory distress, breathing rate appears normal, no obvious gross SOB, gasping or wheezing  CV: no obvious cyanosis  MS: moves all visible extremities without noticeable abnormality  PSYCH/NEURO: pleasant and cooperative, no obvious depression or anxiety, speech and thought processing grossly intact  ASSESSMENT AND PLAN:  Discussed the following assessment and plan:  Hyperglycemia  Uncontrolled type 2 diabetes mellitus with hyperglycemia (HCC)  Neck pain  -we discussed possible serious and likely etiologies, options for evaluation and workup, limitations of telemedicine visit vs in person visit, treatment, treatment risks and precautions. Pt prefers to treat via telemedicine empirically rather then risking or undertaking an in person visit at this moment. Reviewed medications. Opted to hold the last few doses of prednisone. Agree with recs to increase basal insulin a little at least in the short term and advised monitoring fasting and postprandial BSs over the next 1-2 weeks. Discussed a healthy diet for diabetes. Advised of fasting, postprandial and hgba1c goals. advised on hypoglycemia. Follow up with PCP neck week for neck pain  and diabetes. Patient agrees to seek prompt in person care if worsening, new symptoms arise, or if is not improving with treatment.   I discussed the assessment and treatment plan with the patient. The patient was provided an opportunity to ask questions and all were answered. The patient agreed with the plan and demonstrated an understanding of the instructions.   The patient was advised to call back or seek an in-person evaluation if the symptoms worsen or if the condition fails to improve as anticipated.   Lucretia Kern, DO

## 2019-07-28 ENCOUNTER — Other Ambulatory Visit: Payer: Self-pay

## 2019-07-29 ENCOUNTER — Ambulatory Visit: Payer: BC Managed Care – PPO | Admitting: Family Medicine

## 2019-07-29 ENCOUNTER — Encounter: Payer: Self-pay | Admitting: Family Medicine

## 2019-07-29 VITALS — BP 124/86 | HR 70 | Temp 97.9°F | Resp 12 | Ht 74.0 in | Wt 325.5 lb

## 2019-07-29 DIAGNOSIS — Z794 Long term (current) use of insulin: Secondary | ICD-10-CM | POA: Diagnosis not present

## 2019-07-29 DIAGNOSIS — E1169 Type 2 diabetes mellitus with other specified complication: Secondary | ICD-10-CM | POA: Diagnosis not present

## 2019-07-29 MED ORDER — BASAGLAR KWIKPEN 100 UNIT/ML ~~LOC~~ SOPN: 20.0000 [IU] | PEN_INJECTOR | Freq: Every day | SUBCUTANEOUS | 3 refills | Status: DC

## 2019-07-29 MED ORDER — DEXCOM G5 RECEIVER KIT DEVI: 1.0000 | 0 refills | Status: DC

## 2019-07-29 MED ORDER — DEXCOM G6 TRANSMITTER MISC: 1.0000 | 3 refills | Status: DC

## 2019-07-29 MED ORDER — DEXCOM G6 SENSOR MISC: 1 refills | Status: DC

## 2019-07-29 NOTE — Patient Instructions (Signed)
A few things to remember from today's visit:   Type 2 diabetes mellitus with other specified complication, with long-term current use of insulin ([place]) - Plan: Ambulatory referral to Endocrinology   HgA1C goal < 7.0, ideally closer to 6.5. Avoid sugar added food:regular soft drinks, energy drinks, and sports drinks. candy. cakes. cookies. pies and cobblers. sweet rolls, pastries, and donuts. fruit drinks, such as fruitades and fruit punch. dairy desserts, such as ice cream  Mediterranean diet has showed benefits for sugar control.  How much and what type of carbohydrate foods are important for managing diabetes. The balance between how much insulin is in your body and the carbohydrate you eat makes a difference in your blood glucose levels.  Fasting blood sugar ideally 130 or less, 2 hours after meals less than 180.   Regular exercise also will help with controlling disease, daily brisk walking as tolerated for 15-30 min definitively will help.   Avoid skipping meals, blood sugar might drop and cause serious problems. Remember checking feet periodically, good dental hygiene, and annual eye exam.   If you need refills please call your pharmacy. Do not use My Chart to request refills or for acute issues that need immediate attention.    Please be sure medication list is accurate. If a new problem present, please set up appointment sooner than planned today.

## 2019-07-29 NOTE — Progress Notes (Signed)
Chief Complaint  Patient presents with   Hyperglycemia   HPI: [name] is a 44 y.o. female, who is here today concerned about elevated glucose. She was seen on 07/13/19, when prednisone was prescribed to treat LUE tingling, ? Radicular pain. On 07/23/19 glucose in the 500's and 400's. + Polydipsia and polyuria. BS's are slowly going down since Prednisone was discontinued.  Lab Results  Component Value Date   HGBA1C 6.9 (A) 07/13/2019   Currently she is on Basaglar 15 U daily and Victoza 1.8 mg daily.  She has some questions about treatment (type of diabetes and insulins) ,she feels like she did not know enough about disease and how to give herself insulin.  She would like to establish with endocrinologist.  LUE tingling has improved. Negative for weakness.  Review of Systems  Constitutional: Negative for chills, fever, malaise/fatigue and weight loss.  Respiratory: Negative for cough, shortness of breath and wheezing.   Cardiovascular: Negative for chest pain, palpitations and leg swelling.  Genitourinary: Negative for dysuria and hematuria.  Musculoskeletal: Negative for joint pain and myalgias.  Skin: Negative for rash.  Neurological: Negative for sensory change, weakness and headaches.  Rest see pertinent positives and negatives per HPI.   Current Outpatient Medications on File Prior to Visit  Medication Sig Dispense Refill   albuterol (VENTOLIN HFA) 108 (90 Base) MCG/ACT inhaler INHALE 2 PUFFS INTO THE LUNGS EVERY 6 HOURS AS NEEDED FOR WHEEZING OR SHORTNESS OF BREATH 8.5 g 1   amLODipine (NORVASC) 5 MG tablet TAKE 1 TABLET(5 MG) BY MOUTH DAILY 90 tablet 1   glucose blood test strip Use to test blood sugar once daily. One Touch Verio Test Strips. 100 each 1   Insulin Pen Needle (PEN NEEDLES) 32G X 4 MM MISC 1 pen by Does not apply route 2 (two) times daily. 200 each 3   levocetirizine (XYZAL) 5 MG tablet TAKE 1 TABLET(5 MG) BY MOUTH EVERY EVENING 30  tablet 2   liraglutide (VICTOZA) 18 MG/3ML SOPN Start with 0.6 mg daily and increase to 1.2 in 3 weeks, then increase to 1.8 if well tolerated. 3 pen 3   mometasone (NASONEX) 50 MCG/ACT nasal spray Place 2 sprays into the nose daily. 17 g 3   montelukast (SINGULAIR) 10 MG tablet Take 1 tablet (10 mg total) by mouth at bedtime. 90 tablet 1   olmesartan-hydrochlorothiazide (BENICAR HCT) 40-25 MG tablet TAKE 1 TABLET BY MOUTH DAILY 90 tablet 1   omeprazole (PRILOSEC) 40 MG capsule TAKE 1 CAPSULE(40 MG) BY MOUTH DAILY 90 capsule 2   OneTouch Delica Lancets 22L MISC Use to test blood sugar once daily. 100 each 1   PRESCRIPTION MEDICATION Allergy Nose Spray Daily     tiZANidine (ZANAFLEX) 4 MG tablet Take 1 tablet (4 mg total) by mouth every 8 (eight) hours as needed for muscle spasms. 30 tablet 1   WIXELA INHUB 250-50 MCG/DOSE AEPB INHALE 1 PUFF INTO THE LUNGS TWICE DAILY 60 each 1   No current facility-administered medications on file prior to visit.   Past Medical History:  Diagnosis Date   Allergy    Anemia    Asthma    Diabetes mellitus without complication ([place]) 79/8921   GERD (gastroesophageal reflux disease)    Hypertension    OSA (obstructive sleep apnea)    Sleep apnea    no cpap - told no OSA after told did so unsure if + or not    Allergies  Allergen Reactions   Codeine     Unable to Urinate     Social History   Socioeconomic History   Marital status: Married    Spouse name: Not on file   Number of children: 1   Years of education: Not on file   Highest education level: Not on file  Occupational History   Occupation: Software engineer  Tobacco Use   Smoking status: Current Every Day Smoker    Packs/day: 0.50    Last attempt to quit: 11/25/2018    Years since quitting: 0.6   Smokeless tobacco: Never Used   Tobacco comment: down to 3 cigarettes per day  Substance and Sexual Activity   Alcohol use: Yes    Comment: socially    Drug use: No   Sexual activity: Not on file  Other Topics Concern   Not on file  Social History Narrative   Not on file   Social Determinants of Health   Financial Resource Strain:    Difficulty of Paying Living Expenses:   Food Insecurity:    Worried About Charity fundraiser in the Last Year:    Arboriculturist in the Last Year:   Transportation Needs:    Film/video editor (Medical):    Lack of Transportation (Non-Medical):   Physical Activity:    Days of Exercise per Week:    Minutes of Exercise per Session:   Stress:    Feeling of Stress :   Social Connections:    Frequency of Communication with Friends and Family:    Frequency of Social Gatherings with Friends and Family:    Attends Religious Services:    Active Member of Clubs or Organizations:    Attends Archivist Meetings:    Marital Status:     Vitals:   07/29/19 1615  BP: 124/86  Pulse: 70  Resp: 12  Temp: 97.9 F (36.6 C)  SpO2: 96%   Body mass index is 41.79 kg/m.  Physical Exam  Nursing note and vitals reviewed. Constitutional: She is oriented to person, place, and time. She appears well-developed. No distress.  HENT:  Head: Normocephalic and atraumatic.  Eyes: Pupils are equal, round, and reactive to light. Conjunctivae are normal.  Cardiovascular: Normal rate and regular rhythm.  No murmur heard. Respiratory: Effort normal and breath sounds normal. No respiratory distress.  GI: Soft. She exhibits no mass. There is no abdominal tenderness.  Musculoskeletal:        General: No edema.  Neurological: She is alert and oriented to person, place, and time. She has normal strength. No cranial nerve deficit. Gait normal.  Skin: Skin is warm. No rash noted. No erythema.  Psychiatric: She has a normal mood and affect.  Well groomed, good eye contact.   ASSESSMENT AND PLAN:  Monica [name] was seen today for hyperglycemia.  Diagnoses and all orders for this visit:  Type 2  diabetes mellitus with other specified complication, with long-term current use of insulin ([place]) -     Ambulatory referral to Endocrinology -     Continuous Blood Gluc Receiver (DEXCOM G5 RECEIVER KIT) DEVI; 1 Device by Does not apply route as directed. -     Continuous Blood Gluc Transmit (DEXCOM G6 TRANSMITTER) MISC; 1 Device by Does not apply route every 3 (three) months. -     Continuous Blood Gluc Sensor (DEXCOM G6 SENSOR) MISC; Change sensor q 10 days. -     Insulin Glargine (BASAGLAR KWIKPEN) 100 UNIT/ML; Inject  0.2 mLs (20 Units total) into the skin at bedtime.   We discussed Dx,prognosis,goals in regard to numbers,and treatment options. Carb count. She agrees with increasing Basaglar from 15 U to 20 U No changes in Victoza. Monitor for hypoglycemia. Continue regular physical activity. Referral to endo placed.  Return in about 6 months (around 01/28/2020) for HTN and HLD.   Avik Leoni G. [place]ique, MD  [facility]. [place] office.  Discharge Instructions   None    A few things to remember from today's visit:   Type 2 diabetes mellitus with other specified complication, with long-term current use of insulin ([place]) - Plan: Ambulatory referral to Endocrinology   HgA1C goal < 7.0, ideally closer to 6.5. Avoid sugar added food:regular soft drinks, energy drinks, and sports drinks. candy. cakes. cookies. pies and cobblers. sweet rolls, pastries, and donuts. fruit drinks, such as fruitades and fruit punch. dairy desserts, such as ice cream  Mediterranean diet has showed benefits for sugar control.  How much and what type of carbohydrate foods are important for managing diabetes. The balance between how much insulin is in your body and the carbohydrate you eat makes a difference in your blood glucose levels.  Fasting blood sugar ideally 130 or less, 2 hours after meals less than 180.   Regular exercise also will help with controlling disease, daily brisk walking as  tolerated for 15-30 min definitively will help.   Avoid skipping meals, blood sugar might drop and cause serious problems. Remember checking feet periodically, good dental hygiene, and annual eye exam.   If you need refills please call your pharmacy. Do not use My Chart to request refills or for acute issues that need immediate attention.    Please be sure medication list is accurate. If a new problem present, please set up appointment sooner than planned today.

## 2019-09-15 ENCOUNTER — Other Ambulatory Visit: Payer: Self-pay

## 2019-09-15 ENCOUNTER — Encounter: Payer: Self-pay | Admitting: Endocrinology

## 2019-09-15 ENCOUNTER — Ambulatory Visit: Payer: BC Managed Care – PPO | Admitting: Endocrinology

## 2019-09-15 VITALS — BP 112/66 | HR 116 | Ht 74.0 in | Wt 322.0 lb

## 2019-09-15 DIAGNOSIS — E1169 Type 2 diabetes mellitus with other specified complication: Secondary | ICD-10-CM | POA: Diagnosis not present

## 2019-09-15 DIAGNOSIS — E119 Type 2 diabetes mellitus without complications: Secondary | ICD-10-CM | POA: Diagnosis not present

## 2019-09-15 DIAGNOSIS — Z794 Long term (current) use of insulin: Secondary | ICD-10-CM | POA: Diagnosis not present

## 2019-09-15 LAB — POCT GLYCOSYLATED HEMOGLOBIN (HGB A1C): Hemoglobin A1C: 7.3 % — AB (ref 4.0–5.6)

## 2019-09-15 MED ORDER — REPAGLINIDE 2 MG PO TABS: 2.0000 mg | ORAL_TABLET | Freq: Three times a day (TID) | ORAL | 3 refills | Status: DC

## 2019-09-15 NOTE — Patient Instructions (Addendum)
good diet and exercise significantly improve the control of your diabetes.  please let me know if you wish to be referred to a dietician.  high blood sugar is very risky to your health.  you should see an eye doctor and dentist every year.  It is very important to get all recommended vaccinations.  Controlling your blood pressure and cholesterol drastically reduces the damage diabetes does to your body.  Those who smoke should quit.  Please discuss these with your doctor.  check your blood sugar once a day.  vary the time of day when you check, between before the 3 meals, and at bedtime.  also check if you have symptoms of your blood sugar being too high or too low.  please keep a record of the readings and bring it to your next appointment here (or you can bring the meter itself).  You can write it on any piece of paper.  please call us sooner if your blood sugar goes below 70, or if you have a lot of readings over 200.   I have sent a prescription to your pharmacy, to change the basaglar to "repaglinide." Please continue the same other medications.   Please come back for a follow-up appointment in 2 months.

## 2019-09-15 NOTE — Progress Notes (Signed)
Subjective:    Patient ID: Monica [name], female    DOB: [birth year], 44 y.o.   MRN: [id number]  HPI pt is referred by for diabetes.  Pt states DM was dx'ed in 2018 (she had GDM in 1999); he is unaware of any chronic complications; he has been on insulin since 2020; pt says her diet and exercise are fair; she has never had pancreatitis, pancreatic surgery, severe hypoglycemia or DKA.  victoza dosage is limited by nausea.  She did not tolerate Jardiance (vaginitis) or metformin (abd pain).  She says cbg varies from 90-218.   Past Medical History:  Diagnosis Date  . Allergy   . Anemia   . Asthma   . Diabetes mellitus without complication ([place]) 40/9811  . GERD (gastroesophageal reflux disease)   . Hypertension   . OSA (obstructive sleep apnea)   . Sleep apnea    no cpap - told no OSA after told did so unsure if + or not     Past Surgical History:  Procedure Laterality Date  . ABDOMINAL HYSTERECTOMY  02/19/2006  . BICEPS TENDON REPAIR Bilateral   . FOOT SURGERY     Left  . KNEE ARTHROSCOPY Bilateral   . ROTATOR CUFF REPAIR Bilateral     Social History   Socioeconomic History  . Marital status: Married    Spouse name: Not on file  . Number of children: 1  . Years of education: Not on file  . Highest education level: Not on file  Occupational History  . Occupation: Software engineer  Tobacco Use  . Smoking status: Current Every Day Smoker    Packs/day: 0.50    Last attempt to quit: 11/25/2018    Years since quitting: 0.8  . Smokeless tobacco: Never Used  . Tobacco comment: down to 3 cigarettes per day  Vaping Use  . Vaping Use: Never used  Substance and Sexual Activity  . Alcohol use: Yes    Comment: socially  . Drug use: No  . Sexual activity: Not on file  Other Topics Concern  . Not on file  Social History Narrative  . Not on file   Social Determinants of Health   Financial Resource Strain:   . Difficulty of Paying Living Expenses:   Food  Insecurity:   . Worried About Charity fundraiser in the Last Year:   . Arboriculturist in the Last Year:   Transportation Needs:   . Film/video editor (Medical):   Marland Kitchen Lack of Transportation (Non-Medical):   Physical Activity:   . Days of Exercise per Week:   . Minutes of Exercise per Session:   Stress:   . Feeling of Stress :   Social Connections:   . Frequency of Communication with Friends and Family:   . Frequency of Social Gatherings with Friends and Family:   . Attends Religious Services:   . Active Member of Clubs or Organizations:   . Attends Archivist Meetings:   Marland Kitchen Marital Status:   Intimate Partner Violence:   . Fear of Current or Ex-Partner:   . Emotionally Abused:   Marland Kitchen Physically Abused:   . Sexually Abused:     Current Outpatient Medications on File Prior to Visit  Medication Sig Dispense Refill  . albuterol (VENTOLIN HFA) 108 (90 Base) MCG/ACT inhaler INHALE 2 PUFFS INTO THE LUNGS EVERY 6 HOURS AS NEEDED FOR WHEEZING OR SHORTNESS OF BREATH 8.5 g 1  . amLODipine (NORVASC) 5 MG tablet  TAKE 1 TABLET(5 MG) BY MOUTH DAILY 90 tablet 1  . glucose blood (ONETOUCH VERIO) test strip 1 each by Other route 4 (four) times daily.    . Insulin Pen Needle (PEN NEEDLES) 32G X 4 MM MISC 1 pen by Does not apply route 2 (two) times daily. 200 each 3  . levocetirizine (XYZAL) 5 MG tablet TAKE 1 TABLET(5 MG) BY MOUTH EVERY EVENING 30 tablet 2  . liraglutide (VICTOZA) 18 MG/3ML SOPN Start with 0.6 mg daily and increase to 1.2 in 3 weeks, then increase to 1.8 if well tolerated. (Patient taking differently: Inject 1.2 mg into the skin daily. ) 3 pen 3  . mometasone (NASONEX) 50 MCG/ACT nasal spray Place 2 sprays into the nose daily. (Patient taking differently: Place 2 sprays into the nose as needed. ) 17 g 3  . montelukast (SINGULAIR) 10 MG tablet Take 1 tablet (10 mg total) by mouth at bedtime. 90 tablet 1  . olmesartan-hydrochlorothiazide (BENICAR HCT) 40-25 MG tablet TAKE 1  TABLET BY MOUTH DAILY 90 tablet 1  . omeprazole (PRILOSEC) 40 MG capsule TAKE 1 CAPSULE(40 MG) BY MOUTH DAILY 90 capsule 2  . OneTouch Delica Lancets 00B MISC Use to test blood sugar once daily. 100 each 1  . tiZANidine (ZANAFLEX) 4 MG tablet Take 1 tablet (4 mg total) by mouth every 8 (eight) hours as needed for muscle spasms. 30 tablet 1  . WIXELA INHUB 250-50 MCG/DOSE AEPB INHALE 1 PUFF INTO THE LUNGS TWICE DAILY (Patient taking differently: Inhale 1 puff into the lungs as needed. ) 60 each 1   No current facility-administered medications on file prior to visit.    Allergies  Allergen Reactions  . Codeine     Unable to Urinate     Family History  Problem Relation Age of Onset  . Hyperlipidemia Mother   . Stroke Mother   . Heart disease Mother   . Hypertension Mother   . Diabetes Mother   . Colon polyps Mother        TA+  . Hyperlipidemia Father   . Heart disease Father   . Stroke Father   . Hypertension Father   . Diabetes Father   . Colon cancer Father        Dx'd in his 58's   . Diabetes Brother   . Hypertension Maternal Grandmother   . Diabetes Mellitus II Maternal Grandmother   . Colon cancer Paternal Uncle   . Esophageal cancer Neg Hx   . Rectal cancer Neg Hx   . Stomach cancer Neg Hx     BP 112/66   Pulse (!) 116   Ht 6\' 2"  (1.88 m)   Wt (!) 322 lb (146.1 kg)   SpO2 97%   BMI 41.34 kg/m    Review of Systems denies blurry vision, chest pain, sob, n/v, memory loss, nausea, and depression.  She has lost 60 lbs x 1 year.       Objective:   Physical Exam VS: see vs page GEN: no distress HEAD: head: no deformity eyes: no periorbital swelling, no proptosis external nose and ears are normal NECK: supple, thyroid is not enlarged CHEST WALL: no deformity LUNGS: clear to auscultation CV: reg rate and rhythm, no murmur.   MUSCULOSKELETAL: muscle bulk and strength are grossly normal.  no obvious joint swelling.  gait is normal and steady EXTEMITIES: no  deformity.  no ulcer on the feet.  feet are of normal color and temp.  1+ bilat leg  edema.  there is bilateral onychomycosis of the toenails PULSES: dorsalis pedis intact bilat.  no carotid bruit NEURO:  cn 2-12 grossly intact.   readily moves all 4's.  sensation is intact to touch on the feet.   SKIN:  Normal texture and temperature.  No rash or suspicious lesion is visible.   NODES:  None palpable at the neck PSYCH: alert, well-oriented.  Does not appear anxious nor depressed.    Lab Results  Component Value Date   CREATININE 0.76 12/26/2018   BUN 13 12/26/2018   NA 138 12/26/2018   K 4.0 12/26/2018   CL 105 12/26/2018   CO2 25 12/26/2018   Lab Results  Component Value Date   TSH 2.87 08/13/2016   T3TOTAL 106.6 10/26/2009   Lab Results  Component Value Date   HGBA1C 7.3 (A) 09/15/2019   I have reviewed outside records, and summarized: Pt was noted to have elevated A1c, and referred here. It was noted that glycemic control was improving off prednisone.      Assessment & Plan:  type 2 DM: she can have a trial off insulin. abd pain, due to metformin, by hx. She declines to try low-dose metformin-XR.   Tachycardia: not thyroid-related.  Recheck next time.    Patient Instructions  good diet and exercise significantly improve the control of your diabetes.  please let me know if you wish to be referred to a dietician.  high blood sugar is very risky to your health.  you should see an eye doctor and dentist every year.  It is very important to get all recommended vaccinations.  Controlling your blood pressure and cholesterol drastically reduces the damage diabetes does to your body.  Those who smoke should quit.  Please discuss these with your doctor.  check your blood sugar once a day.  vary the time of day when you check, between before the 3 meals, and at bedtime.  also check if you have symptoms of your blood sugar being too high or too low.  please keep a record of the readings  and bring it to your next appointment here (or you can bring the meter itself).  You can write it on any piece of paper.  please call us sooner if your blood sugar goes below 70, or if you have a lot of readings over 200.   I have sent a prescription to your pharmacy, to change the basaglar to "repaglinide." Please continue the same other medications.   Please come back for a follow-up appointment in 2 months.

## 2019-11-01 ENCOUNTER — Other Ambulatory Visit: Payer: Self-pay | Admitting: Family Medicine

## 2019-11-01 DIAGNOSIS — J309 Allergic rhinitis, unspecified: Secondary | ICD-10-CM

## 2019-11-08 ENCOUNTER — Other Ambulatory Visit: Payer: Self-pay | Admitting: Family Medicine

## 2019-11-18 ENCOUNTER — Other Ambulatory Visit: Payer: Self-pay | Admitting: Family Medicine

## 2019-11-18 DIAGNOSIS — J309 Allergic rhinitis, unspecified: Secondary | ICD-10-CM

## 2019-11-20 ENCOUNTER — Ambulatory Visit: Payer: BC Managed Care – PPO | Admitting: Endocrinology

## 2019-11-24 ENCOUNTER — Ambulatory Visit (INDEPENDENT_AMBULATORY_CARE_PROVIDER_SITE_OTHER): Payer: BC Managed Care – PPO | Admitting: Family Medicine

## 2019-11-24 ENCOUNTER — Other Ambulatory Visit: Payer: Self-pay

## 2019-11-24 ENCOUNTER — Encounter: Payer: Self-pay | Admitting: Family Medicine

## 2019-11-24 VITALS — BP 124/70 | HR 93 | Resp 16 | Ht 74.0 in | Wt 327.0 lb

## 2019-11-24 DIAGNOSIS — M79644 Pain in right finger(s): Secondary | ICD-10-CM | POA: Diagnosis not present

## 2019-11-24 DIAGNOSIS — I10 Essential (primary) hypertension: Secondary | ICD-10-CM | POA: Diagnosis not present

## 2019-11-24 DIAGNOSIS — Z23 Encounter for immunization: Secondary | ICD-10-CM | POA: Diagnosis not present

## 2019-11-24 DIAGNOSIS — M65311 Trigger thumb, right thumb: Secondary | ICD-10-CM | POA: Diagnosis not present

## 2019-11-24 DIAGNOSIS — E785 Hyperlipidemia, unspecified: Secondary | ICD-10-CM

## 2019-11-24 DIAGNOSIS — E1169 Type 2 diabetes mellitus with other specified complication: Secondary | ICD-10-CM

## 2019-11-24 MED ORDER — CELECOXIB 100 MG PO CAPS: 100.0000 mg | ORAL_CAPSULE | Freq: Two times a day (BID) | ORAL | 0 refills | Status: AC

## 2019-11-24 NOTE — Patient Instructions (Addendum)
A few things to remember from today's visit:   Need for influenza vaccination - Plan: Flu Vaccine QUAD 36+ mos IM  Trigger finger of right thumb - Plan: Ambulatory referral to Sports Medicine  Hypertension, essential, benign - Plan: BASIC METABOLIC PANEL WITH GFR  Hyperlipidemia associated with type 2 diabetes mellitus ([place]) - Plan: Lipid panel  If you need refills please call your pharmacy. Do not use My Chart to request refills or for acute issues that need immediate attention.   No changes in chronic meds today. Please be sure medication list is accurate. If a new problem present, please set up appointment sooner than planned today.   Trigger Finger  Trigger finger, also called stenosing tenosynovitis,  is a condition that causes a finger to get stuck in a bent position. Each finger has a tendon, which is a tough, cord-like tissue that connects muscle to bone, and each tendon passes through a tunnel of tissue called a tendon sheath. To move your finger, your tendon needs to glide freely through the sheath. Trigger finger happens when the tendon or the sheath thickens, making it difficult to move your finger. Trigger finger can affect any finger or a thumb. It may affect more than one finger. Mild cases may clear up with rest and medicine. Severe cases require more treatment. What are the causes? Trigger finger is caused by a thickened finger tendon or tendon sheath. The cause of this thickening is not known. What increases the risk? The following factors may make you more likely to develop this condition:  Doing activities that require a strong grip.  Having rheumatoid arthritis, gout, or diabetes.  Being 39-1 years old.  Being female. What are the signs or symptoms? Symptoms of this condition include:  Pain when bending or straightening your finger.  Tenderness or swelling where your finger attaches to the palm of your hand.  A lump in the palm of your hand or on the  inside of your finger.  Hearing a noise like a pop or a snap when you try to straighten your finger.  Feeling a catching or locking sensation when you try to straighten your finger.  Being unable to straighten your finger. How is this diagnosed? This condition is diagnosed based on your symptoms and a physical exam. How is this treated? This condition may be treated by:  Resting your finger and avoiding activities that make symptoms worse.  Wearing a finger splint to keep your finger extended.  Taking NSAIDs, such as ibuprofen, to relieve pain and swelling.  Doing gentle exercises to stretch the finger as told by your health care provider.  Having medicine that reduces swelling and inflammation (steroids) injected into the tendon sheath. Injections may need to be repeated.  Having surgery to open the tendon sheath. This may be done if other treatments do not work and you cannot straighten your finger. You may need physical therapy after surgery. Follow these instructions at home: If you have a splint:  Wear the splint as told by your health care provider. Remove it only as told by your health care provider.  Loosen it if your fingers tingle, become numb, or turn cold and blue.  Keep it clean.  If the splint is not waterproof: ? Do not let it get wet. ? Cover it with a watertight covering when you take a bath or shower. Managing pain, stiffness, and swelling     If directed, apply heat to the affected area as often as told  by your health care provider. Use the heat source that your health care provider recommends, such as a moist heat pack or a heating pad.  Place a towel between your skin and the heat source.  Leave the heat on for 20-30 minutes.  Remove the heat if your skin turns bright red. This is especially important if you are unable to feel pain, heat, or cold. You may have a greater risk of getting burned. If directed, put ice on the painful area. To do  this:  If you have a removable splint, remove it as told by your health care provider.  Put ice in a plastic bag.  Place a towel between your skin and the bag or between your splint and the bag.  Leave the ice on for 20 minutes, 2-3 times a day.  Activity  Rest your finger as told by your health care provider. Avoid activities that make the pain worse.  Return to your normal activities as told by your health care provider. Ask your health care provider what activities are safe for you.  Do exercises as told by your health care provider.  Ask your health care provider when it is safe to drive if you have a splint on your hand. General instructions  Take over-the-counter and prescription medicines only as told by your health care provider.  Keep all follow-up visits as told by your health care provider. This is important. Contact a health care provider if:  Your symptoms are not improving with home care. Summary  Trigger finger, also called stenosing tenosynovitis, causes your finger to get stuck in a bent position. This can make it difficult and painful to straighten your finger.  This condition develops when a finger tendon or tendon sheath thickens.  Treatment may include resting your finger, wearing a splint, and taking medicines.  In severe cases, surgery to open the tendon sheath may be needed. This information is not intended to replace advice given to you by your health care provider. Make sure you discuss any questions you have with your health care provider. Document Revised: 06/23/2018 Document Reviewed: 06/23/2018 Elsevier Patient Education  [place].

## 2019-11-24 NOTE — Progress Notes (Signed)
Chief Complaint  Patient presents with  . swelling in left thumb   HPI: [name] is a 44 y.o. female, who is here today with above concern. Problem started 2 to 3 weeks ago, no prior history. Right thumb pain, intermittent. She has noted edema of thenar area, no erythema. No limitation of movement but it exacerbates pain but with extension and flexion she feels "popping/catch" sensation. Pain is alleviated by resting. Negative for trauma. No numbness or tingling.  Problem is getting worse, interfering with work.  No OTC medication. She is right-handed.  She was last seen on 07/29/2019.  HLD: She is not on statin med. Lab Results  Component Value Date   CHOL 185 06/09/2018   HDL 60.00 06/09/2018   LDLCALC 109 (H) 06/09/2018   TRIG 84.0 06/09/2018   CHOLHDL 3 06/09/2018   HTN: Currently she is on amlodipine 5 mg daily and olmesartan-HCTZ 40-25 mg daily. She is tolerating medication well. Negative for unusual/severe headache, visual changes, CP, dyspnea, palpitations, focal neurologic deficit, or edema.  Lab Results  Component Value Date   CREATININE 0.76 12/26/2018   BUN 13 12/26/2018   NA 138 12/26/2018   K 4.0 12/26/2018   CL 105 12/26/2018   CO2 25 12/26/2018   She is exercising regularly and trying to eat healthier. She is following with endocrinologist for DM II.  Review of Systems  Constitutional: Negative for activity change, appetite change, fatigue and fever.  HENT: Negative for mouth sores, nosebleeds and sore throat.   Eyes: Negative for redness and visual disturbance.  Respiratory: Negative for cough and wheezing.   Gastrointestinal: Negative for abdominal pain, nausea and vomiting.       Negative for changes in bowel habits.  Genitourinary: Negative for decreased urine volume and hematuria.  Skin: Negative for pallor and rash.  Neurological: Negative for syncope, facial asymmetry and weakness.  Rest see pertinent positives and negatives  per HPI.  Current Outpatient Medications on File Prior to Visit  Medication Sig Dispense Refill  . albuterol (VENTOLIN HFA) 108 (90 Base) MCG/ACT inhaler INHALE 2 PUFFS INTO THE LUNGS EVERY 6 HOURS AS NEEDED FOR WHEEZING OR SHORTNESS OF BREATH 8.5 g 1  . amLODipine (NORVASC) 5 MG tablet TAKE 1 TABLET(5 MG) BY MOUTH DAILY 90 tablet 1  . Insulin Pen Needle (PEN NEEDLES) 32G X 4 MM MISC 1 pen by Does not apply route 2 (two) times daily. 200 each 3  . levocetirizine (XYZAL) 5 MG tablet TAKE 1 TABLET(5 MG) BY MOUTH EVERY EVENING 30 tablet 2  . liraglutide (VICTOZA) 18 MG/3ML SOPN Start with 0.6 mg daily and increase to 1.2 in 3 weeks, then increase to 1.8 if well tolerated. (Patient taking differently: Inject 1.2 mg into the skin daily. ) 3 pen 3  . mometasone (NASONEX) 50 MCG/ACT nasal spray Place 2 sprays into the nose daily. (Patient taking differently: Place 2 sprays into the nose as needed. ) 17 g 3  . montelukast (SINGULAIR) 10 MG tablet TAKE 1 TABLET(10 MG) BY MOUTH AT BEDTIME 90 tablet 1  . olmesartan-hydrochlorothiazide (BENICAR HCT) 40-25 MG tablet TAKE 1 TABLET BY MOUTH DAILY 90 tablet 1  . omeprazole (PRILOSEC) 40 MG capsule TAKE 1 CAPSULE(40 MG) BY MOUTH DAILY 90 capsule 2  . OneTouch Delica Lancets 63O MISC Use to test blood sugar once daily. 100 each 1  . ONETOUCH VERIO test strip USE TO TEST BLOOD SUGAR EVERY DAY 100 strip 3  . repaglinide (PRANDIN) 2  MG tablet Take 1 tablet (2 mg total) by mouth 3 (three) times daily before meals. 90 tablet 3  . tiZANidine (ZANAFLEX) 4 MG tablet Take 1 tablet (4 mg total) by mouth every 8 (eight) hours as needed for muscle spasms. 30 tablet 1  . WIXELA INHUB 250-50 MCG/DOSE AEPB INHALE 1 PUFF INTO THE LUNGS TWICE DAILY (Patient taking differently: Inhale 1 puff into the lungs as needed. ) 60 each 1   No current facility-administered medications on file prior to visit.   Past Medical History:  Diagnosis Date  . Allergy   . Anemia   . Asthma   .  Diabetes mellitus without complication ([place]) 90/3009  . GERD (gastroesophageal reflux disease)   . Hypertension   . OSA (obstructive sleep apnea)   . Sleep apnea    no cpap - told no OSA after told did so unsure if + or not    Allergies  Allergen Reactions  . Codeine     Unable to Urinate    Social History   Socioeconomic History  . Marital status: Married    Spouse name: Not on file  . Number of children: 1  . Years of education: Not on file  . Highest education level: Not on file  Occupational History  . Occupation: Software engineer  Tobacco Use  . Smoking status: Current Every Day Smoker    Packs/day: 0.50    Last attempt to quit: 11/25/2018    Years since quitting: 0.9  . Smokeless tobacco: Never Used  . Tobacco comment: down to 3 cigarettes per day  Vaping Use  . Vaping Use: Never used  Substance and Sexual Activity  . Alcohol use: Yes    Comment: socially  . Drug use: No  . Sexual activity: Not on file  Other Topics Concern  . Not on file  Social History Narrative  . Not on file   Social Determinants of Health   Financial Resource Strain:   . Difficulty of Paying Living Expenses: Not on file  Food Insecurity:   . Worried About Charity fundraiser in the Last Year: Not on file  . Ran Out of Food in the Last Year: Not on file  Transportation Needs:   . Lack of Transportation (Medical): Not on file  . Lack of Transportation (Non-Medical): Not on file  Physical Activity:   . Days of Exercise per Week: Not on file  . Minutes of Exercise per Session: Not on file  Stress:   . Feeling of Stress : Not on file  Social Connections:   . Frequency of Communication with Friends and Family: Not on file  . Frequency of Social Gatherings with Friends and Family: Not on file  . Attends Religious Services: Not on file  . Active Member of Clubs or Organizations: Not on file  . Attends Archivist Meetings: Not on file  . Marital Status: Not on file     Vitals:   11/24/19 0740  BP: 124/70  Pulse: 93  Resp: 16  SpO2: 99%   Body mass index is 41.98 kg/m.  Physical Exam Vitals and nursing note reviewed.  Constitutional:      General: She is not in acute distress.    Appearance: She is well-developed.  HENT:     Head: Normocephalic and atraumatic.  Eyes:     Conjunctiva/sclera: Conjunctivae normal.     Pupils: Pupils are equal, round, and reactive to light.  Cardiovascular:  Rate and Rhythm: Normal rate and regular rhythm.     Pulses:          Radial pulses are 2+ on the right side.     Heart sounds: No murmur heard.      Comments: PT pulses present bilateral. Pulmonary:     Effort: Pulmonary effort is normal. No respiratory distress.     Breath sounds: Normal breath sounds.  Abdominal:     Palpations: Abdomen is soft. There is no hepatomegaly or mass.     Tenderness: There is no abdominal tenderness.  Musculoskeletal:     Right lower leg: No edema.     Left lower leg: No edema.     Comments: Upon inspection I do not appreciate thickened tendons but small nodular like felt upon palpation, it elicits pain.  No fixed contractures appreciated. There is a mild clicking sensation upon flexion and extension movement. Joint movement causes pain. No signs of synovitis.  There is no tenderness upon palpation of radial styloid, no edema or erythema appreciated, no limitation of wrist ROM and no pain elicited on radial styloid with Finkelstein maneuver.  Lymphadenopathy:     Cervical: No cervical adenopathy.  Skin:    General: Skin is warm.     Findings: No erythema or rash.  Neurological:     General: No focal deficit present.     Mental Status: She is alert and oriented to person, place, and time.     Cranial Nerves: No cranial nerve deficit.     Gait: Gait normal.  Psychiatric:        Mood and Affect: Mood and affect normal.     Comments: Well groomed, good eye contact.   ASSESSMENT AND PLAN:  Ms.Rosela was seen  today for swelling in left thumb.  Diagnoses and all orders for this visit: Orders Placed This Encounter  Procedures  . Flu Vaccine QUAD 36+ mos IM  . BASIC METABOLIC PANEL WITH GFR  . Lipid panel  . Ambulatory referral to Sports Medicine   Lab Results  Component Value Date   CREATININE 0.84 11/24/2019   BUN 14 11/24/2019   NA 138 11/24/2019   K 4.3 11/24/2019   CL 105 11/24/2019   CO2 25 11/24/2019   Lab Results  Component Value Date   CHOL 179 11/24/2019   HDL 53 11/24/2019   LDLCALC 110 (H) 11/24/2019   TRIG 71 11/24/2019   CHOLHDL 3.4 11/24/2019    Thumb pain, right I do not appreciate edema and there is no pain upon palpation of thenar area, just with palpation of right thumb MCP joint and with thumb movement. Posterior rotation discussed,?  OA. Some side effects of NSAIDs discussed, her renal function and BP has been well controlled.  -     celecoxib (CELEBREX) 100 MG capsule; Take 1 capsule (100 mg total) by mouth 2 (two) times daily for 10 days.  Hypertension, essential, benign BP adequately controlled. Continue amlodipine 5 mg and olmesartan-HCTZ 40-25 mg daily. Monitor BP regularly and continue low-salt diet. Periodic eye exams.  Hyperlipidemia associated with type 2 diabetes mellitus ([place]) Currently she is not on pharmacologic treatment. Further recommendation will be given according to lipid panel result.  Trigger finger of right thumb Examination today suggest trigger finger. We discussed diagnosis and treatment options. Referral to sports medicine was placed, we will try to arrange appointment for this week. Work note given. Celebrex and milligrams twice daily for 5 to 7 days recommended.  Need for  influenza vaccination -     Flu Vaccine QUAD 36+ mos IM   Return in about 35 weeks (around 07/26/2020) for cpe.   Christan Ciccarelli G. [place], MD  [facility] [place] office.    A few things to remember from today's visit:   Need for  influenza vaccination - Plan: Flu Vaccine QUAD 36+ mos IM  Trigger finger of right thumb - Plan: Ambulatory referral to Sports Medicine  Hypertension, essential, benign - Plan: BASIC METABOLIC PANEL WITH GFR  Hyperlipidemia associated with type 2 diabetes mellitus ([place]) - Plan: Lipid panel  If you need refills please call your pharmacy. Do not use My Chart to request refills or for acute issues that need immediate attention.   No changes in chronic meds today. Please be sure medication list is accurate. If a new problem present, please set up appointment sooner than planned today.   Trigger Finger  Trigger finger, also called stenosing tenosynovitis,  is a condition that causes a finger to get stuck in a bent position. Each finger has a tendon, which is a tough, cord-like tissue that connects muscle to bone, and each tendon passes through a tunnel of tissue called a tendon sheath. To move your finger, your tendon needs to glide freely through the sheath. Trigger finger happens when the tendon or the sheath thickens, making it difficult to move your finger. Trigger finger can affect any finger or a thumb. It may affect more than one finger. Mild cases may clear up with rest and medicine. Severe cases require more treatment. What are the causes? Trigger finger is caused by a thickened finger tendon or tendon sheath. The cause of this thickening is not known. What increases the risk? The following factors may make you more likely to develop this condition:  Doing activities that require a strong grip.  Having rheumatoid arthritis, gout, or diabetes.  Being 26-83 years old.  Being female. What are the signs or symptoms? Symptoms of this condition include:  Pain when bending or straightening your finger.  Tenderness or swelling where your finger attaches to the palm of your hand.  A lump in the palm of your hand or on the inside of your finger.  Hearing a noise like a pop or a  snap when you try to straighten your finger.  Feeling a catching or locking sensation when you try to straighten your finger.  Being unable to straighten your finger. How is this diagnosed? This condition is diagnosed based on your symptoms and a physical exam. How is this treated? This condition may be treated by:  Resting your finger and avoiding activities that make symptoms worse.  Wearing a finger splint to keep your finger extended.  Taking NSAIDs, such as ibuprofen, to relieve pain and swelling.  Doing gentle exercises to stretch the finger as told by your health care provider.  Having medicine that reduces swelling and inflammation (steroids) injected into the tendon sheath. Injections may need to be repeated.  Having surgery to open the tendon sheath. This may be done if other treatments do not work and you cannot straighten your finger. You may need physical therapy after surgery. Follow these instructions at home: If you have a splint:  Wear the splint as told by your health care provider. Remove it only as told by your health care provider.  Loosen it if your fingers tingle, become numb, or turn cold and blue.  Keep it clean.  If the splint is not waterproof: ? Do  not let it get wet. ? Cover it with a watertight covering when you take a bath or shower. Managing pain, stiffness, and swelling     If directed, apply heat to the affected area as often as told by your health care provider. Use the heat source that your health care provider recommends, such as a moist heat pack or a heating pad.  Place a towel between your skin and the heat source.  Leave the heat on for 20-30 minutes.  Remove the heat if your skin turns bright red. This is especially important if you are unable to feel pain, heat, or cold. You may have a greater risk of getting burned. If directed, put ice on the painful area. To do this:  If you have a removable splint, remove it as told by your  health care provider.  Put ice in a plastic bag.  Place a towel between your skin and the bag or between your splint and the bag.  Leave the ice on for 20 minutes, 2-3 times a day.  Activity  Rest your finger as told by your health care provider. Avoid activities that make the pain worse.  Return to your normal activities as told by your health care provider. Ask your health care provider what activities are safe for you.  Do exercises as told by your health care provider.  Ask your health care provider when it is safe to drive if you have a splint on your hand. General instructions  Take over-the-counter and prescription medicines only as told by your health care provider.  Keep all follow-up visits as told by your health care provider. This is important. Contact a health care provider if:  Your symptoms are not improving with home care. Summary  Trigger finger, also called stenosing tenosynovitis, causes your finger to get stuck in a bent position. This can make it difficult and painful to straighten your finger.  This condition develops when a finger tendon or tendon sheath thickens.  Treatment may include resting your finger, wearing a splint, and taking medicines.  In severe cases, surgery to open the tendon sheath may be needed. This information is not intended to replace advice given to you by your health care provider. Make sure you discuss any questions you have with your health care provider. Document Revised: 06/23/2018 Document Reviewed: 06/23/2018 Elsevier Patient Education  [place].

## 2019-11-25 ENCOUNTER — Ambulatory Visit: Payer: Self-pay

## 2019-11-25 ENCOUNTER — Ambulatory Visit: Payer: BC Managed Care – PPO | Admitting: Family Medicine

## 2019-11-25 ENCOUNTER — Encounter: Payer: Self-pay | Admitting: Family Medicine

## 2019-11-25 VITALS — BP 106/78 | HR 86 | Ht 74.0 in | Wt 324.4 lb

## 2019-11-25 DIAGNOSIS — M79645 Pain in left finger(s): Secondary | ICD-10-CM

## 2019-11-25 DIAGNOSIS — M65311 Trigger thumb, right thumb: Secondary | ICD-10-CM

## 2019-11-25 LAB — LIPID PANEL
Cholesterol: 179 mg/dL (ref <200)
HDL: 53 mg/dL (ref >=50)
LDL Cholesterol (Calc): 110 mg/dL (calc) — ABNORMAL HIGH
Non-HDL Cholesterol (Calc): 126 mg/dL (calc) (ref <130)
Total CHOL/HDL Ratio: 3.4 (calc) (ref <5.0)
Triglycerides: 71 mg/dL (ref <150)

## 2019-11-25 LAB — BASIC METABOLIC PANEL WITH GFR
BUN: 14 mg/dL (ref 7–25)
CO2: 25 mmol/L (ref 20–32)
Calcium: 9.1 mg/dL (ref 8.6–10.2)
Chloride: 105 mmol/L (ref 98–110)
Creat: 0.84 mg/dL (ref 0.50–1.10)
GFR, Est African American: 97 mL/min/{1.73_m2} (ref >=60)
GFR, Est Non African American: 83 mL/min/{1.73_m2} (ref >=60)
Glucose, Bld: 133 mg/dL — ABNORMAL HIGH (ref 65–99)
Potassium: 4.3 mmol/L (ref 3.5–5.3)
Sodium: 138 mmol/L (ref 135–146)

## 2019-11-25 LAB — SPECIMEN COMPROMISED

## 2019-11-25 NOTE — Patient Instructions (Signed)
Thank you for coming in today.  Call or go to the ER if you develop a large red swollen joint with extreme pain or oozing puss.   Splint the finger at the IP joint (top joint).   Use the double bandaid splint or a alumafoam splint.   Recheck as needed.   Please use voltaren gel up to 4x daily for pain as needed.    Trigger Finger  Trigger finger, also called stenosing tenosynovitis,  is a condition that causes a finger to get stuck in a bent position. Each finger has a tendon, which is a tough, cord-like tissue that connects muscle to bone, and each tendon passes through a tunnel of tissue called a tendon sheath. To move your finger, your tendon needs to glide freely through the sheath. Trigger finger happens when the tendon or the sheath thickens, making it difficult to move your finger. Trigger finger can affect any finger or a thumb. It may affect more than one finger. Mild cases may clear up with rest and medicine. Severe cases require more treatment. What are the causes? Trigger finger is caused by a thickened finger tendon or tendon sheath. The cause of this thickening is not known. What increases the risk? The following factors may make you more likely to develop this condition:  Doing activities that require a strong grip.  Having rheumatoid arthritis, gout, or diabetes.  Being 46-78 years old.  Being female. What are the signs or symptoms? Symptoms of this condition include:  Pain when bending or straightening your finger.  Tenderness or swelling where your finger attaches to the palm of your hand.  A lump in the palm of your hand or on the inside of your finger.  Hearing a noise like a pop or a snap when you try to straighten your finger.  Feeling a catching or locking sensation when you try to straighten your finger.  Being unable to straighten your finger. How is this diagnosed? This condition is diagnosed based on your symptoms and a physical exam. How is  this treated? This condition may be treated by:  Resting your finger and avoiding activities that make symptoms worse.  Wearing a finger splint to keep your finger extended.  Taking NSAIDs, such as ibuprofen, to relieve pain and swelling.  Doing gentle exercises to stretch the finger as told by your health care provider.  Having medicine that reduces swelling and inflammation (steroids) injected into the tendon sheath. Injections may need to be repeated.  Having surgery to open the tendon sheath. This may be done if other treatments do not work and you cannot straighten your finger. You may need physical therapy after surgery. Follow these instructions at home: If you have a splint:  Wear the splint as told by your health care provider. Remove it only as told by your health care provider.  Loosen it if your fingers tingle, become numb, or turn cold and blue.  Keep it clean.  If the splint is not waterproof: ? Do not let it get wet. ? Cover it with a watertight covering when you take a bath or shower. Managing pain, stiffness, and swelling     If directed, apply heat to the affected area as often as told by your health care provider. Use the heat source that your health care provider recommends, such as a moist heat pack or a heating pad.  Place a towel between your skin and the heat source.  Leave the heat on for 20-30  minutes.  Remove the heat if your skin turns bright red. This is especially important if you are unable to feel pain, heat, or cold. You may have a greater risk of getting burned. If directed, put ice on the painful area. To do this:  If you have a removable splint, remove it as told by your health care provider.  Put ice in a plastic bag.  Place a towel between your skin and the bag or between your splint and the bag.  Leave the ice on for 20 minutes, 2-3 times a day.  Activity  Rest your finger as told by your health care provider. Avoid activities  that make the pain worse.  Return to your normal activities as told by your health care provider. Ask your health care provider what activities are safe for you.  Do exercises as told by your health care provider.  Ask your health care provider when it is safe to drive if you have a splint on your hand. General instructions  Take over-the-counter and prescription medicines only as told by your health care provider.  Keep all follow-up visits as told by your health care provider. This is important. Contact a health care provider if:  Your symptoms are not improving with home care. Summary  Trigger finger, also called stenosing tenosynovitis, causes your finger to get stuck in a bent position. This can make it difficult and painful to straighten your finger.  This condition develops when a finger tendon or tendon sheath thickens.  Treatment may include resting your finger, wearing a splint, and taking medicines.  In severe cases, surgery to open the tendon sheath may be needed. This information is not intended to replace advice given to you by your health care provider. Make sure you discuss any questions you have with your health care provider. Document Revised: 06/23/2018 Document Reviewed: 06/23/2018 Elsevier Patient Education  [place].

## 2019-11-25 NOTE — Progress Notes (Signed)
Subjective:    I'm seeing this patient as a consultation for:  Dr. [place]ique. Note will be routed back to referring provider/PCP.  CC: R thumb pain / trigger thumb  I, Wendy Poet, LAT, ATC, am serving as scribe for Dr. Lynne Leader.  HPI: Pt is a 44 y/o female presenting w/ intermittent thumb pain x 2 weeks.  She has pain at the palmar MCP associated with triggering at the IP joint.  She notes that she did punched a refrigerator a few weeks prior to the pain starting but did not have any immediate pain or swelling.  She does not think that she hurt her thumb at all with the punch.    R thumb swelling: yes R thumb mechanical symptoms: yes, clicking and locking Aggravating factors: any attempts at R thumb AROM; typing; ADLs (toileting) Treatments tried: Celebrex; ice; Tylenol  Past medical history, Surgical history, Family history, Social history, Allergies, and medications have been entered into the medical record, reviewed.   Review of Systems: No new headache, visual changes, nausea, vomiting, diarrhea, constipation, dizziness, abdominal pain, skin rash, fevers, chills, night sweats, weight loss, swollen lymph nodes, body aches, joint swelling, muscle aches, chest pain, shortness of breath, mood changes, visual or auditory hallucinations.   Objective:    Vitals:   11/25/19 1057  BP: 106/78  Pulse: 86  SpO2: 96%   General: Well Developed, well nourished, and in no acute distress.  Neuro/Psych: Alert and oriented x3, extra-ocular muscles intact, able to move all 4 extremities, sensation grossly intact. Skin: Warm and dry, no rashes noted.  Respiratory: Not using accessory muscles, speaking in full sentences, trachea midline.  Cardiovascular: Pulses palpable, no extremity edema. Abdomen: Does not appear distended. MSK: Right thumb normal-appearing.  Mildly tender palpation MCP palmar aspect nontender otherwise. Triggering present with flexion of IP joint.  No instability or laxity or  weakness present.  Sensation intact distally as is capillary refill.  Lab and Radiology Results  Procedure: Real-time Ultrasound Guided Injection of right trigger thumb A1 pulley Device: Philips Affiniti 50G Images permanently stored and available for review in PACS Verbal informed consent obtained.  Discussed risks and benefits of procedure. Warned about infection bleeding damage to structures skin hypopigmentation and fat atrophy among others. Patient expresses understanding and agreement Time-out conducted.   Noted no overlying erythema, induration, or other signs of local infection.   Skin prepped in a sterile fashion.   Local anesthesia: Topical Ethyl chloride.   With sterile technique and under real time ultrasound guidance:  40 mg of Depo-Medrol and 0.5 mL of lidocaine injected into tendon sheath at right thumb MCP A1 pulley. Fluid seen entering the tendon sheath.   Completed without difficulty   Pain immediately resolved suggesting accurate placement of the medication.   Advised to call if fevers/chills, erythema, induration, drainage, or persistent bleeding.   Images permanently stored and available for review in the ultrasound unit.  Impression: Technically successful ultrasound guided injection.      Impression and Recommendations:    Assessment and Plan: 44 y.o. female with right trigger thumb.  I do not think today's pain is related to any trauma from when she punched the refrigerator.  She did not have immediate pain or swelling and pain only started later and is located in a different place and will be typical for injury with a punch.   Pain due to trigger thumb..  Plan for injection and double Band-Aid splint.  Recheck if needed.  Neck step  if not better would be either repeat injection or even hand surgery referral for release.  PDMP not reviewed this encounter. Orders Placed This Encounter  Procedures  . [place] LIMITED JOINT SPACE STRUCTURES UP LEFT(NO LINKED CHARGES)      Order Specific Question:   Reason for Exam (SYMPTOM  OR DIAGNOSIS REQUIRED)    Answer:   L thumb pain    Order Specific Question:   Preferred imaging location?    Answer:   [place]   No orders of the defined types were placed in this encounter.   Discussed warning signs or symptoms. Please see discharge instructions. Patient expresses understanding.   The above documentation has been reviewed and is accurate and complete Lynne Leader, M.D.

## 2019-11-30 ENCOUNTER — Other Ambulatory Visit: Payer: Self-pay

## 2019-11-30 MED ORDER — LOVASTATIN 20 MG PO TABS: 20.0000 mg | ORAL_TABLET | Freq: Every day | ORAL | 3 refills | Status: DC

## 2019-12-23 ENCOUNTER — Ambulatory Visit: Payer: BC Managed Care – PPO | Attending: Family

## 2019-12-23 DIAGNOSIS — Z23 Encounter for immunization: Secondary | ICD-10-CM

## 2019-12-28 ENCOUNTER — Other Ambulatory Visit: Payer: Self-pay | Admitting: Family Medicine

## 2019-12-28 DIAGNOSIS — Z1231 Encounter for screening mammogram for malignant neoplasm of breast: Secondary | ICD-10-CM

## 2020-01-07 ENCOUNTER — Encounter: Payer: Self-pay | Admitting: Family Medicine

## 2020-01-07 DIAGNOSIS — R053 Chronic cough: Secondary | ICD-10-CM

## 2020-01-07 DIAGNOSIS — I10 Essential (primary) hypertension: Secondary | ICD-10-CM

## 2020-01-08 ENCOUNTER — Other Ambulatory Visit: Payer: Self-pay

## 2020-01-08 ENCOUNTER — Ambulatory Visit: Payer: BC Managed Care – PPO | Admitting: Endocrinology

## 2020-01-08 VITALS — BP 110/76 | HR 103 | Ht 72.0 in | Wt 325.4 lb

## 2020-01-08 DIAGNOSIS — E1169 Type 2 diabetes mellitus with other specified complication: Secondary | ICD-10-CM

## 2020-01-08 LAB — POCT GLYCOSYLATED HEMOGLOBIN (HGB A1C): Hemoglobin A1C: 6.6 % — AB (ref 4.0–5.6)

## 2020-01-08 LAB — TSH: TSH: 1.63 u[IU]/mL (ref 0.35–4.50)

## 2020-01-08 LAB — T4, FREE: Free T4: 0.81 ng/dL (ref 0.60–1.60)

## 2020-01-08 MED ORDER — AMLODIPINE BESYLATE 5 MG PO TABS: ORAL_TABLET | ORAL | 1 refills | Status: DC

## 2020-01-08 MED ORDER — TRULICITY 1.5 MG/0.5ML ~~LOC~~ SOAJ: 1.5000 mg | SUBCUTANEOUS | 3 refills | Status: DC

## 2020-01-08 MED ORDER — ALBUTEROL SULFATE HFA 108 (90 BASE) MCG/ACT IN AERS: INHALATION_SPRAY | RESPIRATORY_TRACT | 1 refills | Status: DC

## 2020-01-08 NOTE — Progress Notes (Signed)
Subjective:    Patient ID: Monica [name], female    DOB: Mar 06, 1975, 44 y.o.   MRN: [id number]  HPI Pt returns for f/u of diabetes mellitus: DM type: 2 Dx'ed: 3149 Complications: none Therapy: Victoza and repaglinide.  GDM: 1999 DKA: never Severe hypoglycemia: never Pancreatitis: never Pancreatic imaging: normal on 2003 CT SDOH: none Other: she took insulin 2020-2021; victoza dosage is limited by nausea.  She did not tolerate Jardiance (vaginitis) or metformin (abd pain) Interval history: Pt says cbg's are well-controlled.  pt states she feels well in general.  She takes meds as rx'ed. Pt says she was rx'ed for hyperthyroidism in approx 2006.  She took medication x a few years.  It was stopped, due to euthyroidism. Past Medical History:  Diagnosis Date  . Allergy   . Anemia   . Asthma   . Diabetes mellitus without complication ([place]) 70/2637  . GERD (gastroesophageal reflux disease)   . Hypertension   . OSA (obstructive sleep apnea)   . Sleep apnea    no cpap - told no OSA after told did so unsure if + or not     Past Surgical History:  Procedure Laterality Date  . ABDOMINAL HYSTERECTOMY  02/19/2006  . BICEPS TENDON REPAIR Bilateral   . FOOT SURGERY     Left  . KNEE ARTHROSCOPY Bilateral   . ROTATOR CUFF REPAIR Bilateral     Social History   Socioeconomic History  . Marital status: Married    Spouse name: Not on file  . Number of children: 1  . Years of education: Not on file  . Highest education level: Not on file  Occupational History  . Occupation: Software engineer  Tobacco Use  . Smoking status: Current Every Day Smoker    Packs/day: 0.50    Last attempt to quit: 11/25/2018    Years since quitting: 1.1  . Smokeless tobacco: Never Used  . Tobacco comment: down to 3 cigarettes per day  Vaping Use  . Vaping Use: Never used  Substance and Sexual Activity  . Alcohol use: Yes    Comment: socially  . Drug use: No  . Sexual activity: Not on file   Other Topics Concern  . Not on file  Social History Narrative  . Not on file   Social Determinants of Health   Financial Resource Strain:   . Difficulty of Paying Living Expenses: Not on file  Food Insecurity:   . Worried About Charity fundraiser in the Last Year: Not on file  . Ran Out of Food in the Last Year: Not on file  Transportation Needs:   . Lack of Transportation (Medical): Not on file  . Lack of Transportation (Non-Medical): Not on file  Physical Activity:   . Days of Exercise per Week: Not on file  . Minutes of Exercise per Session: Not on file  Stress:   . Feeling of Stress : Not on file  Social Connections:   . Frequency of Communication with Friends and Family: Not on file  . Frequency of Social Gatherings with Friends and Family: Not on file  . Attends Religious Services: Not on file  . Active Member of Clubs or Organizations: Not on file  . Attends Archivist Meetings: Not on file  . Marital Status: Not on file  Intimate Partner Violence:   . Fear of Current or Ex-Partner: Not on file  . Emotionally Abused: Not on file  . Physically Abused: Not on  file  . Sexually Abused: Not on file    Current Outpatient Medications on File Prior to Visit  Medication Sig Dispense Refill  . albuterol (VENTOLIN HFA) 108 (90 Base) MCG/ACT inhaler INHALE 2 PUFFS INTO THE LUNGS EVERY 6 HOURS AS NEEDED FOR WHEEZING OR SHORTNESS OF BREATH 8.5 g 1  . amLODipine (NORVASC) 5 MG tablet TAKE 1 TABLET(5 MG) BY MOUTH DAILY 90 tablet 1  . Insulin Pen Needle (PEN NEEDLES) 32G X 4 MM MISC 1 pen by Does not apply route 2 (two) times daily. 200 each 3  . levocetirizine (XYZAL) 5 MG tablet TAKE 1 TABLET(5 MG) BY MOUTH EVERY EVENING 30 tablet 2  . lovastatin (MEVACOR) 20 MG tablet Take 1 tablet (20 mg total) by mouth at bedtime. 90 tablet 3  . mometasone (NASONEX) 50 MCG/ACT nasal spray Place 2 sprays into the nose daily. (Patient taking differently: Place 2 sprays into the nose as  needed. ) 17 g 3  . montelukast (SINGULAIR) 10 MG tablet TAKE 1 TABLET(10 MG) BY MOUTH AT BEDTIME 90 tablet 1  . olmesartan-hydrochlorothiazide (BENICAR HCT) 40-25 MG tablet TAKE 1 TABLET BY MOUTH DAILY 90 tablet 1  . omeprazole (PRILOSEC) 40 MG capsule TAKE 1 CAPSULE(40 MG) BY MOUTH DAILY 90 capsule 2  . OneTouch Delica Lancets 19J MISC Use to test blood sugar once daily. 100 each 1  . ONETOUCH VERIO test strip USE TO TEST BLOOD SUGAR EVERY DAY 100 strip 3  . repaglinide (PRANDIN) 2 MG tablet Take 1 tablet (2 mg total) by mouth 3 (three) times daily before meals. 90 tablet 3  . WIXELA INHUB 250-50 MCG/DOSE AEPB INHALE 1 PUFF INTO THE LUNGS TWICE DAILY (Patient taking differently: Inhale 1 puff into the lungs as needed. ) 60 each 1  . tiZANidine (ZANAFLEX) 4 MG tablet Take 1 tablet (4 mg total) by mouth every 8 (eight) hours as needed for muscle spasms. (Patient not taking: Reported on 01/08/2020) 30 tablet 1   No current facility-administered medications on file prior to visit.    Allergies  Allergen Reactions  . Codeine     Unable to Urinate     Family History  Problem Relation Age of Onset  . Hyperlipidemia Mother   . Stroke Mother   . Heart disease Mother   . Hypertension Mother   . Diabetes Mother   . Colon polyps Mother        TA+  . Hyperlipidemia Father   . Heart disease Father   . Stroke Father   . Hypertension Father   . Diabetes Father   . Colon cancer Father        Dx'd in his 24's   . Diabetes Brother   . Hypertension Maternal Grandmother   . Diabetes Mellitus II Maternal Grandmother   . Colon cancer Paternal Uncle   . Esophageal cancer Neg Hx   . Rectal cancer Neg Hx   . Stomach cancer Neg Hx     BP 110/76   Pulse (!) 103   Ht 6' (1.829 m)   Wt (!) 325 lb 6.4 oz (147.6 kg)   SpO2 98%   BMI 44.13 kg/m   Review of Systems She denies hypoglycemia.      Objective:   Physical Exam VITAL SIGNS:  See vs page GENERAL: no distress Pulses: dorsalis  pedis intact bilat.   MSK: no deformity of the feet CV: no leg edema Skin:  no ulcer on the feet.  normal color and temp on  the feet. Neuro: sensation is intact to touch on the feet   Lab Results  Component Value Date   TSH 2.87 08/13/2016   T3TOTAL 106.6 10/26/2009   A1c=6.6%    Assessment & Plan:  Type 2 DM: She would benefit from increased rx, if it can be done with a regimen that avoids or minimizes hypoglycemia. Tachycardia: check TSH  Patient Instructions  check your blood sugar once a day.  vary the time of day when you check, between before the 3 meals, and at bedtime.  also check if you have symptoms of your blood sugar being too high or too low.  please keep a record of the readings and bring it to your next appointment here (or you can bring the meter itself).  You can write it on any piece of paper.  please call us sooner if your blood sugar goes below 70, or if you have a lot of readings over 200.   I have sent a prescription to your pharmacy, to change the Victoza to "Trulicity." Please continue the same other repaglinide.   Thyroid blood tests are requested for you today.  We'll let you know about the results.  Please come back for a follow-up appointment in 3-4 months.

## 2020-01-08 NOTE — Patient Instructions (Addendum)
check your blood sugar once a day.  vary the time of day when you check, between before the 3 meals, and at bedtime.  also check if you have symptoms of your blood sugar being too high or too low.  please keep a record of the readings and bring it to your next appointment here (or you can bring the meter itself).  You can write it on any piece of paper.  please call us sooner if your blood sugar goes below 70, or if you have a lot of readings over 200.   I have sent a prescription to your pharmacy, to change the Victoza to "Trulicity." Please continue the same other repaglinide.   Thyroid blood tests are requested for you today.  We'll let you know about the results.  Please come back for a follow-up appointment in 3-4 months.

## 2020-01-11 MED ORDER — OLMESARTAN MEDOXOMIL-HCTZ 40-25 MG PO TABS: 1.0000 | ORAL_TABLET | Freq: Every day | ORAL | 1 refills | Status: DC

## 2020-02-11 ENCOUNTER — Other Ambulatory Visit: Payer: Self-pay

## 2020-02-11 ENCOUNTER — Ambulatory Visit
Admission: RE | Admit: 2020-02-11 | Discharge: 2020-02-11 | Disposition: A | Discharge status: Home / Self Care | Payer: BC Managed Care – PPO | Source: Ambulatory Visit | Attending: Family Medicine | Admitting: Family Medicine

## 2020-02-11 DIAGNOSIS — Z1231 Encounter for screening mammogram for malignant neoplasm of breast: Secondary | ICD-10-CM

## 2020-02-18 ENCOUNTER — Other Ambulatory Visit: Payer: Self-pay | Admitting: Family Medicine

## 2020-02-18 DIAGNOSIS — J309 Allergic rhinitis, unspecified: Secondary | ICD-10-CM

## 2020-02-18 DIAGNOSIS — K219 Gastro-esophageal reflux disease without esophagitis: Secondary | ICD-10-CM

## 2020-02-19 ENCOUNTER — Encounter: Payer: Self-pay | Admitting: Family Medicine

## 2020-02-21 ENCOUNTER — Encounter: Payer: Self-pay | Admitting: Family Medicine

## 2020-02-21 DIAGNOSIS — J309 Allergic rhinitis, unspecified: Secondary | ICD-10-CM

## 2020-02-21 DIAGNOSIS — K219 Gastro-esophageal reflux disease without esophagitis: Secondary | ICD-10-CM

## 2020-02-22 MED ORDER — OMEPRAZOLE 40 MG PO CPDR: DELAYED_RELEASE_CAPSULE | ORAL | 0 refills | Status: DC

## 2020-02-22 MED ORDER — LEVOCETIRIZINE DIHYDROCHLORIDE 5 MG PO TABS: ORAL_TABLET | ORAL | 2 refills | Status: DC

## 2020-03-06 NOTE — Progress Notes (Signed)
   Covid-[facility] Vaccination [facility]  Name:  Monica [name]    MRN: [id number] DOB: 1975/07/27  03/06/2020  Monica [name] was observed post Covid-19 immunization for 15 minutes without incident. She was provided with Vaccine Information Sheet and instruction to access the V-Safe system.   Monica [name] was instructed to call 911 with any severe reactions post vaccine: Marland Kitchen Difficulty breathing  . Swelling of face and throat  . A fast heartbeat  . A bad rash all over body  . Dizziness and weakness   Immunizations Administered    Name Date Dose VIS Date Route   Moderna Covid-19 Booster Vaccine 12/23/2019  9:50 AM 0.25 mL 12/09/2019 Intramuscular   Manufacturer: Moderna   Lot: [id number]   [place]: [id number]

## 2020-03-13 ENCOUNTER — Other Ambulatory Visit: Payer: Self-pay | Admitting: Endocrinology

## 2020-03-17 ENCOUNTER — Other Ambulatory Visit: Payer: Self-pay | Admitting: *Deleted

## 2020-03-17 ENCOUNTER — Telehealth: Payer: Self-pay | Admitting: Endocrinology

## 2020-03-17 MED ORDER — REPAGLINIDE 2 MG PO TABS: ORAL_TABLET | ORAL | 3 refills | Status: DC

## 2020-03-17 NOTE — Telephone Encounter (Signed)
Pt called requesting we resend the prescription for her Repaglinide 2MG . She knows we tried to refill it already because she sees it in her chart but [facility] is telling her they don't have it and that we need to resend it. Pt is out of medication today.  PHARMACY:   [facility] [place], [address] AT [address] Phone:  [phone]  Fax:  [phone]

## 2020-03-17 NOTE — Telephone Encounter (Signed)
Rx resent.

## 2020-05-13 ENCOUNTER — Ambulatory Visit: Payer: BC Managed Care – PPO | Admitting: Endocrinology

## 2020-05-16 ENCOUNTER — Other Ambulatory Visit: Payer: Self-pay | Admitting: Family Medicine

## 2020-05-16 DIAGNOSIS — J309 Allergic rhinitis, unspecified: Secondary | ICD-10-CM

## 2020-05-16 DIAGNOSIS — K219 Gastro-esophageal reflux disease without esophagitis: Secondary | ICD-10-CM

## 2020-05-30 ENCOUNTER — Other Ambulatory Visit: Payer: Self-pay | Admitting: Family Medicine

## 2020-06-01 ENCOUNTER — Ambulatory Visit (INDEPENDENT_AMBULATORY_CARE_PROVIDER_SITE_OTHER): Payer: BC Managed Care – PPO | Admitting: Family Medicine

## 2020-06-01 ENCOUNTER — Encounter: Payer: Self-pay | Admitting: Family Medicine

## 2020-06-01 ENCOUNTER — Other Ambulatory Visit: Payer: Self-pay

## 2020-06-01 VITALS — BP 130/70 | HR 80 | Resp 16 | Ht 72.0 in | Wt 323.5 lb

## 2020-06-01 DIAGNOSIS — E1169 Type 2 diabetes mellitus with other specified complication: Secondary | ICD-10-CM | POA: Diagnosis not present

## 2020-06-01 DIAGNOSIS — R053 Chronic cough: Secondary | ICD-10-CM

## 2020-06-01 DIAGNOSIS — J4520 Mild intermittent asthma, uncomplicated: Secondary | ICD-10-CM

## 2020-06-01 DIAGNOSIS — E785 Hyperlipidemia, unspecified: Secondary | ICD-10-CM | POA: Diagnosis not present

## 2020-06-01 DIAGNOSIS — Z0000 Encounter for general adult medical examination without abnormal findings: Secondary | ICD-10-CM

## 2020-06-01 DIAGNOSIS — Z1159 Encounter for screening for other viral diseases: Secondary | ICD-10-CM | POA: Diagnosis not present

## 2020-06-01 DIAGNOSIS — J309 Allergic rhinitis, unspecified: Secondary | ICD-10-CM

## 2020-06-01 DIAGNOSIS — I10 Essential (primary) hypertension: Secondary | ICD-10-CM | POA: Diagnosis not present

## 2020-06-01 LAB — LIPID PANEL
Cholesterol: 141 mg/dL (ref 0–200)
HDL: 62.6 mg/dL (ref >39.00)
LDL Cholesterol: 67 mg/dL (ref 0–99)
NonHDL: 77.93
Total CHOL/HDL Ratio: 2
Triglycerides: 55 mg/dL (ref 0.0–149.0)
VLDL: 11 mg/dL (ref 0.0–40.0)

## 2020-06-01 LAB — COMPREHENSIVE METABOLIC PANEL
ALT: 15 U/L (ref 0–35)
AST: 18 U/L (ref 0–37)
Albumin: 3.7 g/dL (ref 3.5–5.2)
Alkaline Phosphatase: 66 U/L (ref 39–117)
BUN: 13 mg/dL (ref 6–23)
CO2: 29 mEq/L (ref 19–32)
Calcium: 9.3 mg/dL (ref 8.4–10.5)
Chloride: 102 mEq/L (ref 96–112)
Creatinine, Ser: 0.87 mg/dL (ref 0.40–1.20)
GFR: 79.48 mL/min (ref >60.00)
Glucose, Bld: 98 mg/dL (ref 70–99)
Potassium: 4.1 mEq/L (ref 3.5–5.1)
Sodium: 139 mEq/L (ref 135–145)
Total Bilirubin: 0.4 mg/dL (ref 0.2–1.2)
Total Protein: 6.9 g/dL (ref 6.0–8.3)

## 2020-06-01 MED ORDER — MOMETASONE FUROATE 50 MCG/ACT NA SUSP: 2.0000 | Freq: Every day | NASAL | 4 refills | Status: DC

## 2020-06-01 MED ORDER — LEVOCETIRIZINE DIHYDROCHLORIDE 5 MG PO TABS: ORAL_TABLET | ORAL | 2 refills | Status: DC

## 2020-06-01 MED ORDER — ALBUTEROL SULFATE HFA 108 (90 BASE) MCG/ACT IN AERS: INHALATION_SPRAY | RESPIRATORY_TRACT | 3 refills | Status: DC

## 2020-06-01 NOTE — Progress Notes (Signed)
HPI: [name] is a 45 y.o. female, who is here today for her routine physical.  Last CPE: >2 years ago.  Regular exercise 3 or more time per week: She is going to the gyn 4 times per week, moderate to high intensity exercise. Following a healthy diet: She does not drink sodas, no sweets. She acknowledges she needs to work on carbs. Eats 1-2 per day. She lives with her wife and   Chronic medical problems: HTN,DM II,HLD,asthma,and allergies among some. + Smoker. Pap smear: S/P hysterectomy. Hx of abnormal pap smears: Negative.  Immunization History  Administered Date(s) Administered  . Influenza,inj,Quad PF,6+ Mos 12/12/2016, 11/25/2017, 12/26/2018, 11/24/2019  . Moderna SARS-COV2 Booster Vaccination 12/23/2019  . Moderna Sars-Covid-2 Vaccination 04/30/2019, 06/02/2019  . PPD Test 03/11/2016, 10/15/2016  . Pneumococcal Polysaccharide-23 08/13/2016  . Tdap 04/24/2016   Mammogram: 02/16/20 Bi-Rads 1. Colonoscopy: 06/01/19. DEXA: N/A Hep C screening: Never.  DM II: Following with endocrinologist. She has had hypoglycemic events, Sunday BS 55. Next appt with Monica [name] in 2 weeks.  Allergic rhinitis: ENT recommenced Nasonex nasal spray to be used daily and neti pot. Still has not started nasal saline irrigations. She is on Xyzal 5 mg daily and Singulair 10 mg daily.  Frontal pressure x 2 days and nasal congestion. Negative for fever, chills,or change sin appetite.  Albuterol inh more often during spring. She is on Wixela 250-50 mcg bid.  HTN: She is on Olmesartan-HCTZ 40-25 mg daily and Amlodipine 5 mg daily. Component     Latest Ref Rng & Units 11/24/2019  Glucose     70 - 99 mg/dL 133 (H)  BUN     6 - 23 mg/dL 14  Creatinine     0.40 - 1.20 mg/dL 0.84  GFR, Est Non African American     > OR = 60 mL/min/1.73m2 83  GFR, Est African American     > OR = 60 mL/min/1.73m2 97  BUN/Creatinine Ratio     6 - 22 (calc) NOT APPLICABLE  Sodium     135 - 145 mEq/L  138  Potassium     3.5 - 5.1 mEq/L 4.3  Chloride     96 - 112 mEq/L 105  CO2     19  - 32 mEq/L 25  Calcium     8.4 - 10.5 mg/dL 9.1   HLD: She is on Lovastatin 20 mg daily. Component     Latest Ref Rng & Units 11/24/2019  Cholesterol     0 - 200 mg/dL 179  HDL Cholesterol     >39.00 mg/dL 53  Triglycerides     0.0 - 149.0 mg/dL 71  LDL Cholesterol (Calc)     mg/dL (calc) 110 (H)  Total CHOL/HDL Ratio      3.4  Non-HDL Cholesterol (Calc)     <130 mg/dL (calc) 126    Review of Systems  Constitutional: Positive for fatigue. Negative for appetite change.  HENT: Positive for congestion. Negative for hearing loss, mouth sores and sore throat.   Eyes: Negative for redness and visual disturbance.  Respiratory: Negative for cough, shortness of breath and wheezing.   Cardiovascular: Negative for chest pain and leg swelling.  Gastrointestinal: Negative for abdominal pain, nausea and vomiting.       No changes in bowel habits.  Endocrine: Negative for cold intolerance, heat intolerance, polydipsia, polyphagia and polyuria.  Genitourinary: Negative for decreased urine volume, dysuria, hematuria, vaginal bleeding and vaginal discharge.  Musculoskeletal: Negative for gait  problem and myalgias.  Skin: Negative for color change and rash.  Allergic/Immunologic: Positive for environmental allergies.  Neurological: Positive for headaches. Negative for syncope, facial asymmetry and weakness.  Hematological: Negative for adenopathy. Does not bruise/bleed easily.  Psychiatric/Behavioral: Negative for behavioral problems and confusion.  All other systems reviewed and are negative.  Current Outpatient Medications on File Prior to Visit  Medication Sig Dispense Refill  . amLODipine (NORVASC) 5 MG tablet TAKE 1 TABLET(5 MG) BY MOUTH DAILY 90 tablet 1  . Dulaglutide (TRULICITY) 1.5 KZ/6.0FU SOPN Inject 1.5 mg into the skin once a week. 6 mL 3  . Insulin Pen Needle (PEN NEEDLES) 32G X 4 MM MISC 1  pen by Does not apply route 2 (two) times daily. 200 each 3  . Lancets (ONETOUCH DELICA PLUS [id number]) MISC USE TO TEST BLOOD SUGAR EVERY DAY 100 each 1  . lovastatin (MEVACOR) 20 MG tablet Take 1 tablet (20 mg total) by mouth at bedtime. 90 tablet 3  . montelukast (SINGULAIR) 10 MG tablet TAKE 1 TABLET(10 MG) BY MOUTH AT BEDTIME 90 tablet 1  . olmesartan-hydrochlorothiazide (BENICAR HCT) 40-25 MG tablet Take 1 tablet by mouth daily. 90 tablet 1  . omeprazole (PRILOSEC) 40 MG capsule TAKE 1 CAPSULE(40 MG) BY MOUTH DAILY 90 capsule 0  . ONETOUCH VERIO test strip USE TO TEST BLOOD SUGAR EVERY DAY 100 strip 3  . repaglinide (PRANDIN) 2 MG tablet TAKE 1 TABLET(2 MG) BY MOUTH THREE TIMES DAILY BEFORE MEALS 90 tablet 3  . tiZANidine (ZANAFLEX) 4 MG tablet Take 1 tablet (4 mg total) by mouth every 8 (eight) hours as needed for muscle spasms. 30 tablet 1  . WIXELA INHUB 250-50 MCG/DOSE AEPB INHALE 1 PUFF INTO THE LUNGS TWICE DAILY (Patient taking differently: Inhale 1 puff into the lungs as needed.) 60 each 1   No current facility-administered medications on file prior to visit.   Past Medical History:  Diagnosis Date  . Allergy   . Anemia   . Asthma   . Diabetes mellitus without complication ([place]) 20/2542  . GERD (gastroesophageal reflux disease)   . Hypertension   . OSA (obstructive sleep apnea)   . Sleep apnea    no cpap - told no OSA after told did so unsure if + or not     Past Surgical History:  Procedure Laterality Date  . ABDOMINAL HYSTERECTOMY  02/19/2006  . BICEPS TENDON REPAIR Bilateral   . FOOT SURGERY     Left  . KNEE ARTHROSCOPY Bilateral   . ROTATOR CUFF REPAIR Bilateral     Allergies  Allergen Reactions  . Codeine     Unable to Urinate     Family History  Problem Relation Age of Onset  . Hyperlipidemia Mother   . Stroke Mother   . Heart disease Mother   . Hypertension Mother   . Diabetes Mother   . Colon polyps Mother        TA+  . Hyperlipidemia Father    . Heart disease Father   . Stroke Father   . Hypertension Father   . Diabetes Father   . Colon cancer Father        Dx'd in his 55's   . Diabetes Brother   . Hypertension Maternal Grandmother   . Diabetes Mellitus II Maternal Grandmother   . Colon cancer Paternal Uncle   . Esophageal cancer Neg Hx   . Rectal cancer Neg Hx   . Stomach cancer Neg Hx  Social History   Socioeconomic History  . Marital status: Married    Spouse name: Not on file  . Number of children: 1  . Years of education: Not on file  . Highest education level: Not on file  Occupational History  . Occupation: Software engineer  Tobacco Use  . Smoking status: Current Every Day Smoker    Packs/day: 0.50    Last attempt to quit: 11/25/2018    Years since quitting: 1.5  . Smokeless tobacco: Never Used  . Tobacco comment: down to 3 cigarettes per day  Vaping Use  . Vaping Use: Never used  Substance and Sexual Activity  . Alcohol use: Yes    Comment: socially  . Drug use: No  . Sexual activity: Not on file  Other Topics Concern  . Not on file  Social History Narrative  . Not on file   Social Determinants of Health   Financial Resource Strain: Not on file  Food Insecurity: Not on file  Transportation Needs: Not on file  Physical Activity: Not on file  Stress: Not on file  Social Connections: Not on file   Vitals:   06/01/20 0808  BP: 130/70  Pulse: 80  Resp: 16  SpO2: 99%   Body mass index is 43.87 kg/m.  Wt Readings from Last 3 Encounters:  06/01/20 (!) 323 lb 8 oz (146.7 kg)  01/08/20 (!) 325 lb 6.4 oz (147.6 kg)  11/25/19 (!) 324 lb 6.4 oz (147.1 kg)   Physical Exam Vitals and nursing note reviewed. Exam conducted with a chaperone present.  Constitutional:      General: She is not in acute distress.    Appearance: She is well-developed.  HENT:     Head: Normocephalic and atraumatic.     Right Ear: Hearing, tympanic membrane, ear canal and external ear normal.      Left Ear: Hearing, tympanic membrane, ear canal and external ear normal.     Nose: Congestion present.     Mouth/Throat:     Mouth: Mucous membranes are moist.     Pharynx: Oropharynx is clear. Uvula midline.  Eyes:     Extraocular Movements: Extraocular movements intact.     Conjunctiva/sclera: Conjunctivae normal.     Pupils: Pupils are equal, round, and reactive to light.  Neck:     Thyroid: No thyromegaly.     Trachea: No tracheal deviation.  Cardiovascular:     Rate and Rhythm: Normal rate and regular rhythm.     Pulses:          Dorsalis pedis pulses are 2+ on the right side and 2+ on the left side.     Heart sounds: No murmur heard.   Pulmonary:     Effort: Pulmonary effort is normal. No respiratory distress.     Breath sounds: Normal breath sounds.  Chest:  Breasts:     Right: No axillary adenopathy or supraclavicular adenopathy.     Left: No axillary adenopathy or supraclavicular adenopathy.    Abdominal:     Palpations: Abdomen is soft. There is no hepatomegaly or mass.     Tenderness: There is no abdominal tenderness.  Genitourinary:    Comments: Breast: No masses or skin changes. No nipple discharge, left inverted nipple (at her baseline). Musculoskeletal:     Comments: No major deformity or signs of synovitis appreciated.  Lymphadenopathy:     Cervical: No cervical adenopathy.     Upper Body:     Right upper body: No  supraclavicular or axillary adenopathy.     Left upper body: No supraclavicular or axillary adenopathy.  Skin:    General: Skin is warm.     Findings: No erythema or rash.  Neurological:     General: No focal deficit present.     Mental Status: She is alert and oriented to person, place, and time.     Cranial Nerves: No cranial nerve deficit.     Coordination: Coordination normal.     Gait: Gait normal.     Comments: Patellar and biceps DTR's symmetric, 1-2+  Psychiatric:     Comments: Well groomed, good eye contact.   ASSESSMENT AND  PLAN:  Ms. ARITZEL KRUSEMARK was here today annual physical examination.  Orders Placed This Encounter  Procedures  . Comprehensive metabolic panel  . Hepatitis C antibody screen  . Lipid panel   Lab Results  Component Value Date   CHOL 141 06/01/2020   HDL 62.60 06/01/2020   LDLCALC 67 06/01/2020   TRIG 55.0 06/01/2020   CHOLHDL 2 06/01/2020   Lab Results  Component Value Date   CREATININE 0.87 06/01/2020   BUN 13 06/01/2020   NA 139 06/01/2020   K 4.1 06/01/2020   CL 102 06/01/2020   CO2 29 06/01/2020   Lab Results  Component Value Date   ALT 15 06/01/2020   AST 18 06/01/2020   ALKPHOS 66 06/01/2020   BILITOT 0.4 06/01/2020   Routine general medical examination at a health care facility She understands the importance of regular physical activity and healthy diet for prevention of chronic illness and/or complications. Preventive guidelines reviewed. Vaccination up to date.  Next CPE in a year.  Encounter for HCV screening test for low risk patient -     Hepatitis C antibody screen  Hypertension, essential, benign BP adequately controlled. No changes in current management. Low salt diet.  Hyperlipidemia associated with type 2 diabetes mellitus (HCC) Continue Lovastatin 20 mg daily. Further recommendations will be given according to lipid panel results.  Allergic rhinitis, unspecified seasonality, unspecified trigger Recommend starting nasal saline irrigation as needed. No changes in Xyzal or Nasonex nasal spray. She may benefit from immunotherapy. Immunology referral was placed in 03/2019, I do not see recent visit. We will re-evaluate next visit.  -     levocetirizine (XYZAL) 5 MG tablet; TAKE 1 TABLET(5 MG) BY MOUTH EVERY EVENING -     mometasone (NASONEX) 50 MCG/ACT nasal spray; Place 2 sprays into the nose daily.  Asthma, intermittent, uncomplicated Stable. No changes in current management.  -     albuterol (VENTOLIN HFA) 108 (90 Base) MCG/ACT inhaler;  INHALE 2 PUFFS INTO THE LUNGS EVERY 6 HOURS AS NEEDED FOR WHEEZING OR SHORTNESS OF BREATH   Return in 6 months (on 12/01/2020) for HTN,asthma.  Dickie Cloe G. [place], MD  [facility]. [place] office.   Today you have you routine preventive visit. A few things to remember from today's visit:  Routine general medical examination at a health care facility  Encounter for HCV screening test for low risk patient - Plan: Hepatitis C antibody screen, CANCELED: Hepatitis C antibody screen  Hypertension, essential, benign - Plan: Comprehensive metabolic panel, CANCELED: Comprehensive metabolic panel  Hyperlipidemia associated with type 2 diabetes mellitus ([place]) - Plan: Comprehensive metabolic panel, Lipid panel, CANCELED: Comprehensive metabolic panel, CANCELED: Lipid panel  Allergic rhinitis, unspecified seasonality, unspecified trigger - Plan: levocetirizine (XYZAL) 5 MG tablet, mometasone (NASONEX) 50 MCG/ACT nasal spray  Cough, persistent - Plan: albuterol (  VENTOLIN HFA) 108 (90 Base) MCG/ACT inhaler  If you need refills please call your pharmacy. Do not use My Chart to request refills or for acute issues that need immediate attention.   Please be sure medication list is accurate. If a new problem present, please set up appointment sooner than planned today.  At least 150 minutes of moderate exercise per week, daily brisk walking for 15-30 min is a good exercise option. Healthy diet low in saturated (animal) fats and sweets and consisting of fresh fruits and vegetables, lean meats such as fish and white chicken and whole grains.  These are some of recommendations for screening depending of age and risk factors:  - Vaccines:  Tdap vaccine every 10 years.  Shingles vaccine recommended at age 9, could be given after 47 years of age but not sure about insurance coverage.   Pneumonia vaccines: Pneumovax at 49. Sometimes Pneumovax is giving earlier if history of smoking, lung  disease,diabetes,kidney disease among some.  Screening for diabetes at age 50 and every 3 years.N/A  Cervical cancer prevention:  Pap smear starts at 47 years of age and continues periodically until 47 years old in low risk women. Pap smear every 3 years between 77 and 36 years old. Pap smear every 3-5 years between women 58 and older if pap smear negative and HPV screening negative.   -Breast cancer: Mammogram: There is disagreement between experts about when to start screening in low risk asymptomatic female but recent recommendations are to start screening at 39 and not later than 47 years old , every 1-2 years and after 47 yo q 2 years. Screening is recommended until 47 years old but some women can continue screening depending of healthy issues.  Colon cancer screening: Has been recently changed to 47 yo. Insurance may not cover until you are 47 years old. Screening is recommended until 47 years old.  Cholesterol disorder screening at age 61 and every 3 years.N/A  Also recommended:  1. Dental visit- Brush and floss your teeth twice daily; visit your dentist twice a year. 2. Eye doctor- Get an eye exam at least every 2 years. 3. Helmet use- Always wear a helmet when riding a bicycle, motorcycle, rollerblading or skateboarding. 4. Safe sex- If you may be exposed to sexually transmitted infections, use a condom. 5. Seat belts- Seat belts can save your live; always wear one. 6. Smoke/Carbon Monoxide detectors- These detectors need to be installed on the appropriate level of your home. Replace batteries at least once a year. 7. Skin cancer- When out in the sun please cover up and use sunscreen 15 SPF or higher. 8. Violence- If anyone is threatening or hurting you, please tell your healthcare provider.  9. Drink alcohol in moderation- Limit alcohol intake to one drink or less per day. Never drink and drive. 10. Calcium supplementation 1000 to 1200 mg daily, ideally through your diet.   Vitamin D supplementation 800 units daily.

## 2020-06-01 NOTE — Patient Instructions (Addendum)
Today you have you routine preventive visit. A few things to remember from today's visit:  Routine general medical examination at a health care facility  Encounter for HCV screening test for low risk patient - Plan: Hepatitis C antibody screen, CANCELED: Hepatitis C antibody screen  Hypertension, essential, benign - Plan: Comprehensive metabolic panel, CANCELED: Comprehensive metabolic panel  Hyperlipidemia associated with type 2 diabetes mellitus ([place]) - Plan: Comprehensive metabolic panel, Lipid panel, CANCELED: Comprehensive metabolic panel, CANCELED: Lipid panel  Allergic rhinitis, unspecified seasonality, unspecified trigger - Plan: levocetirizine (XYZAL) 5 MG tablet, mometasone (NASONEX) 50 MCG/ACT nasal spray  Cough, persistent - Plan: albuterol (VENTOLIN HFA) 108 (90 Base) MCG/ACT inhaler  If you need refills please call your pharmacy. Do not use My Chart to request refills or for acute issues that need immediate attention.   Please be sure medication list is accurate. If a new problem present, please set up appointment sooner than planned today.  At least 150 minutes of moderate exercise per week, daily brisk walking for 15-30 min is a good exercise option. Healthy diet low in saturated (animal) fats and sweets and consisting of fresh fruits and vegetables, lean meats such as fish and white chicken and whole grains.  These are some of recommendations for screening depending of age and risk factors:  - Vaccines:  Tdap vaccine every 10 years.  Shingles vaccine recommended at age 23, could be given after 47 years of age but not sure about insurance coverage.   Pneumonia vaccines: Pneumovax at 90. Sometimes Pneumovax is giving earlier if history of smoking, lung disease,diabetes,kidney disease among some.  Screening for diabetes at age 110 and every 3 years.N/A  Cervical cancer prevention:  Pap smear starts at 47 years of age and continues periodically until 47 years old in  low risk women. Pap smear every 3 years between 67 and 88 years old. Pap smear every 3-5 years between women 39 and older if pap smear negative and HPV screening negative.   -Breast cancer: Mammogram: There is disagreement between experts about when to start screening in low risk asymptomatic female but recent recommendations are to start screening at 88 and not later than 48 years old , every 1-2 years and after 47 yo q 2 years. Screening is recommended until 47 years old but some women can continue screening depending of healthy issues.  Colon cancer screening: Has been recently changed to 47 yo. Insurance may not cover until you are 47 years old. Screening is recommended until 47 years old.  Cholesterol disorder screening at age 50 and every 3 years.N/A  Also recommended:  1. Dental visit- Brush and floss your teeth twice daily; visit your dentist twice a year. 2. Eye doctor- Get an eye exam at least every 2 years. 3. Helmet use- Always wear a helmet when riding a bicycle, motorcycle, rollerblading or skateboarding. 4. Safe sex- If you may be exposed to sexually transmitted infections, use a condom. 5. Seat belts- Seat belts can save your live; always wear one. 6. Smoke/Carbon Monoxide detectors- These detectors need to be installed on the appropriate level of your home. Replace batteries at least once a year. 7. Skin cancer- When out in the sun please cover up and use sunscreen 15 SPF or higher. 8. Violence- If anyone is threatening or hurting you, please tell your healthcare provider.  9. Drink alcohol in moderation- Limit alcohol intake to one drink or less per day. Never drink and drive. 10. Calcium supplementation 1000 to 1200 mg  daily, ideally through your diet.  Vitamin D supplementation 800 units daily.

## 2020-06-02 LAB — HEPATITIS C ANTIBODY
Hepatitis C Ab: NONREACTIVE
SIGNAL TO CUT-OFF: 0.01 (ref <1.00)

## 2020-06-05 MED ORDER — OLMESARTAN MEDOXOMIL-HCTZ 40-25 MG PO TABS: 1.0000 | ORAL_TABLET | Freq: Every day | ORAL | 2 refills | Status: DC

## 2020-06-05 MED ORDER — AMLODIPINE BESYLATE 5 MG PO TABS: ORAL_TABLET | ORAL | 2 refills | Status: DC

## 2020-06-20 ENCOUNTER — Other Ambulatory Visit: Payer: Self-pay

## 2020-06-20 ENCOUNTER — Ambulatory Visit: Payer: Self-pay | Admitting: Endocrinology

## 2020-06-20 VITALS — BP 110/80 | HR 85 | Ht 72.0 in | Wt 331.2 lb

## 2020-06-20 DIAGNOSIS — E1169 Type 2 diabetes mellitus with other specified complication: Secondary | ICD-10-CM

## 2020-06-20 LAB — POCT GLYCOSYLATED HEMOGLOBIN (HGB A1C): Hemoglobin A1C: 6.5 % — AB (ref 4.0–5.6)

## 2020-06-20 MED ORDER — TRULICITY 3 MG/0.5ML ~~LOC~~ SOAJ: 3.0000 mg | SUBCUTANEOUS | 3 refills | Status: DC

## 2020-06-20 NOTE — Patient Instructions (Addendum)
check your blood sugar once a day.  vary the time of day when you check, between before the 3 meals, and at bedtime.  also check if you have symptoms of your blood sugar being too high or too low.  please keep a record of the readings and bring it to your next appointment here (or you can bring the meter itself).  You can write it on any piece of paper.  please call us sooner if your blood sugar goes below 70, or if you have a lot of readings over 200.   Please stop taking the repaglinide, and: I have sent a prescription to your pharmacy, to double the Trulicity.   Please come back for a follow-up appointment in 4 months.

## 2020-06-20 NOTE — Progress Notes (Signed)
Subjective:    Patient ID: Monica [name], female    DOB: December 02, 1975, 45 y.o.   MRN: [id number]  HPI Pt returns for f/u of diabetes mellitus: DM type: 2 Dx'ed: 2831 Complications: none Therapy: Trulicity and repaglinide.  GDM: 1999 DKA: never Severe hypoglycemia: never Pancreatitis: never Pancreatic imaging: normal on 2003 CT SDOH: none Other: she took insulin 2020-2021; victoza dosage is limited by nausea.  She did not tolerate Jardiance (vaginitis) or metformin (abd pain) Interval history: Pt says cbg's are well-controlled.  pt states she feels well in general.  She takes meds as rx'ed. She seldom has hypoglycemia, and these episodes are mild Pt says she was rx'ed for hyperthyroidism in approx 2006.  She took medication x a few years.  It was stopped, due to euthyroidism.  Past Medical History:  Diagnosis Date  . Allergy   . Anemia   . Asthma   . Diabetes mellitus without complication ([place]) 51/7616  . GERD (gastroesophageal reflux disease)   . Hypertension   . OSA (obstructive sleep apnea)   . Sleep apnea    no cpap - told no OSA after told did so unsure if + or not     Past Surgical History:  Procedure Laterality Date  . ABDOMINAL HYSTERECTOMY  02/19/2006  . BICEPS TENDON REPAIR Bilateral   . FOOT SURGERY     Left  . KNEE ARTHROSCOPY Bilateral   . ROTATOR CUFF REPAIR Bilateral     Social History   Socioeconomic History  . Marital status: Married    Spouse name: Not on file  . Number of children: 1  . Years of education: Not on file  . Highest education level: Not on file  Occupational History  . Occupation: Software engineer  Tobacco Use  . Smoking status: Current Every Day Smoker    Packs/day: 0.50    Last attempt to quit: 11/25/2018    Years since quitting: 1.5  . Smokeless tobacco: Never Used  . Tobacco comment: down to 3 cigarettes per day  Vaping Use  . Vaping Use: Never used  Substance and Sexual Activity  . Alcohol use: Yes     Comment: socially  . Drug use: No  . Sexual activity: Not on file  Other Topics Concern  . Not on file  Social History Narrative  . Not on file   Social Determinants of Health   Financial Resource Strain: Not on file  Food Insecurity: Not on file  Transportation Needs: Not on file  Physical Activity: Not on file  Stress: Not on file  Social Connections: Not on file  Intimate Partner Violence: Not on file    Current Outpatient Medications on File Prior to Visit  Medication Sig Dispense Refill  . albuterol (VENTOLIN HFA) 108 (90 Base) MCG/ACT inhaler INHALE 2 PUFFS INTO THE LUNGS EVERY 6 HOURS AS NEEDED FOR WHEEZING OR SHORTNESS OF BREATH 8.5 g 3  . amLODipine (NORVASC) 5 MG tablet TAKE 1 TABLET(5 MG) BY MOUTH DAILY 90 tablet 2  . Insulin Pen Needle (PEN NEEDLES) 32G X 4 MM MISC 1 pen by Does not apply route 2 (two) times daily. 200 each 3  . Lancets (ONETOUCH DELICA PLUS [id number]) MISC USE TO TEST BLOOD SUGAR EVERY DAY 100 each 1  . levocetirizine (XYZAL) 5 MG tablet TAKE 1 TABLET(5 MG) BY MOUTH EVERY EVENING 90 tablet 2  . lovastatin (MEVACOR) 20 MG tablet Take 1 tablet (20 mg total) by mouth at bedtime. 90 tablet 3  .  mometasone (NASONEX) 50 MCG/ACT nasal spray Place 2 sprays into the nose daily. 17 g 4  . montelukast (SINGULAIR) 10 MG tablet TAKE 1 TABLET(10 MG) BY MOUTH AT BEDTIME 90 tablet 1  . olmesartan-hydrochlorothiazide (BENICAR HCT) 40-25 MG tablet Take 1 tablet by mouth daily. 90 tablet 2  . omeprazole (PRILOSEC) 40 MG capsule TAKE 1 CAPSULE(40 MG) BY MOUTH DAILY 90 capsule 0  . ONETOUCH VERIO test strip USE TO TEST BLOOD SUGAR EVERY DAY 100 strip 3  . tiZANidine (ZANAFLEX) 4 MG tablet Take 1 tablet (4 mg total) by mouth every 8 (eight) hours as needed for muscle spasms. 30 tablet 1  . WIXELA INHUB 250-50 MCG/DOSE AEPB INHALE 1 PUFF INTO THE LUNGS TWICE DAILY (Patient taking differently: Inhale 1 puff into the lungs as needed.) 60 each 1   No current  facility-administered medications on file prior to visit.    Allergies  Allergen Reactions  . Codeine     Unable to Urinate     Family History  Problem Relation Age of Onset  . Hyperlipidemia Mother   . Stroke Mother   . Heart disease Mother   . Hypertension Mother   . Diabetes Mother   . Colon polyps Mother        TA+  . Hyperlipidemia Father   . Heart disease Father   . Stroke Father   . Hypertension Father   . Diabetes Father   . Colon cancer Father        Dx'd in his 82's   . Diabetes Brother   . Hypertension Maternal Grandmother   . Diabetes Mellitus II Maternal Grandmother   . Colon cancer Paternal Uncle   . Esophageal cancer Neg Hx   . Rectal cancer Neg Hx   . Stomach cancer Neg Hx     BP 110/80 (BP Location: Right Arm, Patient Position: Sitting, Cuff Size: Large)   Pulse 85   Ht 6' (1.829 m)   Wt (!) 331 lb 3.2 oz (150.2 kg)   SpO2 97%   BMI 44.92 kg/m    Review of Systems She reports weight gain.  Denies n/v.     Objective:   Physical Exam VITAL SIGNS:  See vs page GENERAL: no distress Pulses: dorsalis pedis intact bilat.   MSK: no deformity of the feet CV: 1+ bilat leg edema Skin:  no ulcer on the feet.  normal color and temp on the feet. Neuro: sensation is intact to touch on the feet, but decreased from normal Ext: there is bilateral onychomycosis of the toenails   Lab Results  Component Value Date   HGBA1C 6.5 (A) 06/20/2020   Lab Results  Component Value Date   TSH 1.63 01/08/2020   T3TOTAL 106.6 10/26/2009       Assessment & Plan:  Type 2 DM: well-controlled Hypoglycemia, due to repaglinide.    Patient Instructions  check your blood sugar once a day.  vary the time of day when you check, between before the 3 meals, and at bedtime.  also check if you have symptoms of your blood sugar being too high or too low.  please keep a record of the readings and bring it to your next appointment here (or you can bring the meter itself).   You can write it on any piece of paper.  please call us sooner if your blood sugar goes below 70, or if you have a lot of readings over 200.   Please stop taking the repaglinide, and:  I have sent a prescription to your pharmacy, to double the Trulicity.   Please come back for a follow-up appointment in 4 months.

## 2020-07-06 ENCOUNTER — Ambulatory Visit: Payer: BC Managed Care – PPO | Admitting: Podiatry

## 2020-07-25 ENCOUNTER — Ambulatory Visit: Payer: BC Managed Care – PPO | Admitting: Podiatry

## 2020-08-09 ENCOUNTER — Ambulatory Visit: Payer: BC Managed Care – PPO | Admitting: Family Medicine

## 2020-08-09 ENCOUNTER — Ambulatory Visit: Payer: Self-pay

## 2020-08-09 ENCOUNTER — Other Ambulatory Visit: Payer: Self-pay

## 2020-08-09 VITALS — BP 132/86 | HR 90 | Ht 72.0 in | Wt 325.0 lb

## 2020-08-09 DIAGNOSIS — M79645 Pain in left finger(s): Secondary | ICD-10-CM

## 2020-08-09 DIAGNOSIS — M65311 Trigger thumb, right thumb: Secondary | ICD-10-CM | POA: Diagnosis not present

## 2020-08-09 NOTE — Progress Notes (Signed)
I, Wendy Poet, LAT, ATC, am serving as scribe for Dr. Lynne Leader.  Monica [name] is a 45 y.o. female who presents to [facility] today for f/u of R thumb pain, possible trigger thumb.  She was last seen by Dr. Georgina Snell on 11/25/19 for similar symptoms and had a R trigger thumb injection.  Since her last visit, pt reports increased pain from double band-aid pain. Pt reports thumb pain flare up about a month ago. Pt not locates pain to R thenar eminence and webspace. 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 Pt is R-hand dominate. Pt notes there is a slight click/pop, but not as bad as when she was seen last.    Pertinent review of systems: No fevers or chills  Relevant historical information: Hypertension, diabetes   Exam:  BP 132/86 (BP Location: Right Arm, Patient Position: Sitting, Cuff Size: Large)   Pulse 90   Ht 6' (1.829 m)   Wt (!) 325 lb (147.4 kg)   SpO2 97%   BMI 44.08 kg/m  General: Well Developed, well nourished, and in no acute distress.   MSK: Right thumb slightly swollen appearing at MCP.  Tender palpation palmar MCP.  Pain with flexion at PIP.  No definitive triggering present.  Intact strength.    Lab and Radiology Results  Procedure: Real-time Ultrasound Guided Injection of right thumb A1 pulley Device: Philips Affiniti 50G Images permanently stored and available for review in PACS Verbal informed consent obtained.  Discussed risks and benefits of procedure. Warned about infection bleeding damage to structures skin hypopigmentation and fat atrophy among others. Patient expresses understanding and agreement Time-out conducted.   Noted no overlying erythema, induration, or other signs of local infection.   Skin prepped in a sterile fashion.   Local anesthesia: Topical Ethyl chloride.   With sterile technique and under real time ultrasound guidance: 0.5 mL of 80 mg/mL Depo-Medrol solution and 0.5 mL of lidocaine injected into tendon  sheath at A1 pulley palmar first MCP. Fluid seen entering the tendon sheath.   Completed without difficulty   Pain immediately resolved suggesting accurate placement of the medication.   Advised to call if fevers/chills, erythema, induration, drainage, or persistent bleeding.   Images permanently stored and available for review in the ultrasound unit.  Impression: Technically successful ultrasound guided injection.        Assessment and Plan: 45 y.o. female with right thumb pain.  Pain at palmar MCP at A1 pulley.  This is the beginnings of trigger finger.  She had trigger finger same hand about 8 months ago treated with injection.  She is already over the last month been treating with double Band-Aid splinting, regular thumb splinting, and Voltaren gel with little benefit.  Plan for injection now.  If this should recur again would consider referral to hand surgery.   PDMP not reviewed this encounter. Orders Placed This Encounter  Procedures   [place] LIMITED JOINT SPACE STRUCTURES UP RIGHT(NO LINKED CHARGES)    Standing Status:   Future    Number of Occurrences:   1    Standing Expiration Date:   02/08/2021    Order Specific Question:   Reason for Exam (SYMPTOM  OR DIAGNOSIS REQUIRED)    Answer:   right thumb pain    Order Specific Question:   Preferred imaging location?    Answer:   [place]   No orders of the defined types were placed in  this encounter.    Discussed warning signs or symptoms. Please see discharge instructions. Patient expresses understanding.   The above documentation has been reviewed and is accurate and complete Lynne Leader, M.D.

## 2020-08-09 NOTE — Patient Instructions (Signed)
Thank you for coming in today.   Call or go to the ER if you develop a large red swollen joint with extreme pain or oozing puss.    Resume the brace or double bandaid splint.   Recheck as needed.

## 2020-08-14 ENCOUNTER — Other Ambulatory Visit: Payer: Self-pay | Admitting: Family Medicine

## 2020-08-14 DIAGNOSIS — K219 Gastro-esophageal reflux disease without esophagitis: Secondary | ICD-10-CM

## 2020-09-13 ENCOUNTER — Ambulatory Visit: Payer: BC Managed Care – PPO | Attending: Family

## 2020-09-13 DIAGNOSIS — Z23 Encounter for immunization: Secondary | ICD-10-CM

## 2020-09-15 ENCOUNTER — Other Ambulatory Visit: Payer: Self-pay

## 2020-09-15 NOTE — Progress Notes (Signed)
   Covid-19 Vaccination Clinic  Name:  Monica [name]    MRN: [id number] DOB: Apr 12, 1975  09/15/2020  [name] was observed post Covid-19 immunization for 15 minutes without incident. She was provided with Vaccine Information Sheet and instruction to access the V-Safe system.   [name] was instructed to call 911 with any severe reactions post vaccine: Difficulty breathing  Swelling of face and throat  A fast heartbeat  A bad rash all over body  Dizziness and weakness   Immunizations Administered     Name Date Dose VIS Date Route   Moderna Covid-19 Booster Vaccine 09/13/2020  9:45 AM 0.25 mL 12/09/2019 Intramuscular   Manufacturer: Levan Hurst   Lot: [id number]   [id number]

## 2020-09-30 ENCOUNTER — Ambulatory Visit: Payer: BC Managed Care – PPO | Admitting: Podiatry

## 2020-09-30 ENCOUNTER — Other Ambulatory Visit: Payer: Self-pay

## 2020-09-30 VITALS — BP 114/83 | HR 89

## 2020-09-30 DIAGNOSIS — E119 Type 2 diabetes mellitus without complications: Secondary | ICD-10-CM

## 2020-09-30 DIAGNOSIS — B351 Tinea unguium: Secondary | ICD-10-CM | POA: Diagnosis not present

## 2020-09-30 DIAGNOSIS — E1169 Type 2 diabetes mellitus with other specified complication: Secondary | ICD-10-CM | POA: Diagnosis not present

## 2020-09-30 DIAGNOSIS — L84 Corns and callosities: Secondary | ICD-10-CM

## 2020-09-30 DIAGNOSIS — M79674 Pain in right toe(s): Secondary | ICD-10-CM | POA: Diagnosis not present

## 2020-09-30 DIAGNOSIS — M79675 Pain in left toe(s): Secondary | ICD-10-CM | POA: Diagnosis not present

## 2020-09-30 DIAGNOSIS — L853 Xerosis cutis: Secondary | ICD-10-CM

## 2020-09-30 MED ORDER — AMMONIUM LACTATE 12 % EX CREA: TOPICAL_CREAM | CUTANEOUS | 6 refills | Status: DC | PRN

## 2020-09-30 NOTE — Patient Instructions (Signed)
Diabetes Mellitus and Foot Care Foot care is an important part of your health, especially when you have diabetes. Diabetes may cause you to have problems because of poor blood flow (circulation) to your feet and legs, which can cause your skin to: Become thinner and drier. Break more easily. Heal more slowly. Peel and crack. You may also have nerve damage (neuropathy) in your legs and feet, causing decreased feeling in them. This means that you may not notice minor injuries to your feet that could lead to more serious problems. Noticing and addressing any potential problems early is the best wayto prevent future foot problems. How to care for your feet Foot hygiene  Wash your feet daily with warm water and mild soap. Do not use hot water. Then, pat your feet and the areas between your toes until they are completely dry. Do not soak your feet as this can dry your skin. Trim your toenails straight across. Do not dig under them or around the cuticle. File the edges of your nails with an emery board or nail file. Apply a moisturizing lotion or petroleum jelly to the skin on your feet and to dry, brittle toenails. Use lotion that does not contain alcohol and is unscented. Do not apply lotion between your toes.  Shoes and socks Wear clean socks or stockings every day. Make sure they are not too tight. Do not wear knee-high stockings since they may decrease blood flow to your legs. Wear shoes that fit properly and have enough cushioning. Always look in your shoes before you put them on to be sure there are no objects inside. To break in new shoes, wear them for just a few hours a day. This prevents injuries on your feet. Wounds, scrapes, corns, and calluses  Check your feet daily for blisters, cuts, bruises, sores, and redness. If you cannot see the bottom of your feet, use a mirror or ask someone for help. Do not cut corns or calluses or try to remove them with medicine. If you find a minor scrape,  cut, or break in the skin on your feet, keep it and the skin around it clean and dry. You may clean these areas with mild soap and water. Do not clean the area with peroxide, alcohol, or iodine. If you have a wound, scrape, corn, or callus on your foot, look at it several times a day to make sure it is healing and not infected. Check for: Redness, swelling, or pain. Fluid or blood. Warmth. Pus or a bad smell.  General tips Do not cross your legs. This may decrease blood flow to your feet. Do not use heating pads or hot water bottles on your feet. They may burn your skin. If you have lost feeling in your feet or legs, you may not know this is happening until it is too late. Protect your feet from hot and cold by wearing shoes, such as at the beach or on hot pavement. Schedule a complete foot exam at least once a year (annually) or more often if you have foot problems. Report any cuts, sores, or bruises to your health care provider immediately. Where to find more information American Diabetes Association: www.diabetes.org Association of Diabetes Care & Education Specialists: www.diabeteseducator.org Contact a health care provider if: You have a medical condition that increases your risk of infection and you have any cuts, sores, or bruises on your feet. You have an injury that is not healing. You have redness on your legs or feet.  You feel burning or tingling in your legs or feet. You have pain or cramps in your legs and feet. Your legs or feet are numb. Your feet always feel cold. You have pain around any toenails. Get help right away if: You have a wound, scrape, corn, or callus on your foot and: You have pain, swelling, or redness that gets worse. You have fluid or blood coming from the wound, scrape, corn, or callus. Your wound, scrape, corn, or callus feels warm to the touch. You have pus or a bad smell coming from the wound, scrape, corn, or callus. You have a fever. You have a red  line going up your leg. Summary Check your feet every day for blisters, cuts, bruises, sores, and redness. Apply a moisturizing lotion or petroleum jelly to the skin on your feet and to dry, brittle toenails. Wear shoes that fit properly and have enough cushioning. If you have foot problems, report any cuts, sores, or bruises to your health care provider immediately. Schedule a complete foot exam at least once a year (annually) or more often if you have foot problems. This information is not intended to replace advice given to you by your health care provider. Make sure you discuss any questions you have with your healthcare provider. Document Revised: 08/27/2019 Document Reviewed: 08/27/2019 Elsevier Patient Education  [place] are small areas of thickened skin that form on the top, sides, or tip of a toe. Corns have a cone-shaped core with a point that can press on a nervebelow. This causes pain. Calluses are areas of thickened skin that can form anywhere on the body, including the hands, fingers, palms, soles of the feet, and heels. Calluses areusually larger than corns. What are the causes? Corns and calluses are caused by rubbing (friction) or pressure, such as from shoes that are too tight or do not fit properly. What increases the risk? Corns are more likely to develop in people who have misshapen toes (toe deformities), such as hammer toes. Calluses can form with friction to any area of the skin. They are more likely to develop in people who: Work with their hands. Wear shoes that fit poorly, are too tight, or are high-heeled. Have toe deformities. What are the signs or symptoms? Symptoms of a corn or callus include: A hard growth on the skin. Pain or tenderness under the skin. Redness and swelling. Increased discomfort while wearing tight-fitting shoes, if your feet are affected. If a corn or callus becomes infected, symptoms may  include: Redness and swelling that gets worse. Pain. Fluid, blood, or pus draining from the corn or callus. How is this diagnosed? Corns and calluses may be diagnosed based on your symptoms, your medicalhistory, and a physical exam. How is this treated? Treatment for corns and calluses may include: Removing the cause of the friction or pressure. This may involve: Changing your shoes. Wearing shoe inserts (orthotics) or other protective layers in your shoes, such as a corn pad. Wearing gloves. Applying medicine to the skin (topical medicine) to help soften skin in the hardened, thickened areas. Removing layers of dead skin with a file to reduce the size of the corn or callus. Removing the corn or callus with a scalpel or laser. Taking antibiotic medicines, if your corn or callus is infected. Having surgery, if a toe deformity is the cause. Follow these instructions at home:  Take over-the-counter and prescription medicines only as told by your health care provider. If  you were prescribed an antibiotic medicine, take it as told by your health care provider. Do not stop taking it even if your condition improves. Wear shoes that fit well. Avoid wearing high-heeled shoes and shoes that are too tight or too loose. Wear any padding, protective layers, gloves, or orthotics as told by your health care provider. Soak your hands or feet. Then use a file or pumice stone to soften your corn or callus. Do this as told by your health care provider. Check your corn or callus every day for signs of infection. Contact a health care provider if: Your symptoms do not improve with treatment. You have redness or swelling that gets worse. Your corn or callus becomes painful. You have fluid, blood, or pus coming from your corn or callus. You have new symptoms. Get help right away if: You develop severe pain with redness. Summary Corns are small areas of thickened skin that form on the top, sides, or tip of  a toe. These can be painful. Calluses are areas of thickened skin that can form anywhere on the body, including the hands, fingers, palms, and soles of the feet. Calluses are usually larger than corns. Corns and calluses are caused by rubbing (friction) or pressure, such as from shoes that are too tight or do not fit properly. Treatment may include wearing padding, protective layers, gloves, or orthotics as told by your health care provider. This information is not intended to replace advice given to you by your health care provider. Make sure you discuss any questions you have with your healthcare provider. Document Revised: 06/04/2019 Document Reviewed: 06/04/2019 Elsevier Patient Education  [place].  Onychomycosis/Fungal Toenails  WHAT IS IT? An infection that lies within the keratin of your nail plate that is caused by a fungus.  WHY ME? Fungal infections affect all ages, sexes, races, and creeds.  There may be many factors that predispose you to a fungal infection such as age, coexisting medical conditions such as diabetes, or an autoimmune disease; stress, medications, fatigue, genetics, etc.  Bottom line: fungus thrives in a warm, moist environment and your shoes offer such a location.  IS IT CONTAGIOUS? Theoretically, yes.  You do not want to share shoes, nail clippers or files with someone who has fungal toenails.  Walking around barefoot in the same room or sleeping in the same bed is unlikely to transfer the organism.  It is important to realize, however, that fungus can spread easily from one nail to the next on the same foot.  HOW DO WE TREAT THIS?  There are several ways to treat this condition.  Treatment may depend on many factors such as age, medications, pregnancy, liver and kidney conditions, etc.  It is best to ask your doctor which options are available to you.  No treatment.   Unlike many other medical concerns, you can live with this condition.  However for many  people this can be a painful condition and may lead to ingrown toenails or a bacterial infection.  It is recommended that you keep the nails cut short to help reduce the amount of fungal nail. Topical treatment.  These range from herbal remedies to prescription strength nail lacquers.  About 40-50% effective, topicals require twice daily application for approximately 9 to 12 months or until an entirely new nail has grown out.  The most effective topicals are medical grade medications available through physicians offices. Oral antifungal medications.  With an 80-90% cure rate, the most common oral  medication requires 3 to 4 months of therapy and stays in your system for a year as the new nail grows out.  Oral antifungal medications do require blood work to make sure it is a safe drug for you.  A liver function panel will be performed prior to starting the medication and after the first month of treatment.  It is important to have the blood work performed to avoid any harmful side effects.  In general, this medication safe but blood work is required. Laser Therapy.  This treatment is performed by applying a specialized laser to the affected nail plate.  This therapy is noninvasive, fast, and non-painful.  It is not covered by insurance and is therefore, out of pocket.  The results have been very good with a 80-95% cure rate.  [facility] is the only practice in the area to offer this therapy. Permanent Nail Avulsion.  Removing the entire nail so that a new nail will not grow back.

## 2020-10-04 ENCOUNTER — Encounter: Payer: Self-pay | Admitting: Podiatry

## 2020-10-04 NOTE — Progress Notes (Signed)
Subjective: Monica [name] presents today referred by [place], Betty G, MD for diabetic foot evaluation.  Patient relates 7 year history of diabetes.  Patient denies any history of foot wounds.  Patient denies symptoms of numbness in feet.  Patient denies symptoms of tingling in feet.  Patient denies symptoms of burning in feet.  Patient denies symptoms of pins/needles sensations in feet.  She states her blood glucose was 190 mg/dl today.  PCP is [place], Betty G, MD , and last visit was 06/01/2020.  She is also followed by Dr. Renato Shin, Endocrinology, and last visit was 06/20/2020  Today, patient c/o of painful, discolored, thick toenails which interfere with daily activities.  Pain is aggravated when wearing enclosed shoe gear.   Past Medical History:  Diagnosis Date   Allergy    Anemia    Asthma    Diabetes mellitus without complication ([place]) Q000111Q   GERD (gastroesophageal reflux disease)    Hypertension    OSA (obstructive sleep apnea)    Sleep apnea    no cpap - told no OSA after told did so unsure if + or not     Patient Active Problem List   Diagnosis Date Noted   Acute medial meniscus tear of right knee 04/02/2018   Pain in right knee 02/18/2018   Asthma, intermittent, uncomplicated 0000000   GERD (gastroesophageal reflux disease) 05/14/2017   Hyperlipidemia associated with type 2 diabetes mellitus ([place]) 01/13/2017   Allergic rhinitis 12/12/2016   Special screening for malignant neoplasms, colon 05/03/2016   Family history of colon cancer in father 05/03/2016   Type 2 diabetes mellitus with other specified complication ([place]) [id number]   Hypertension, essential, benign 04/24/2016   Hypothyroidism 06/28/2008   Morbid obesity ([place]) 06/28/2008   CHEST PAIN 06/28/2008    Past Surgical History:  Procedure Laterality Date   ABDOMINAL HYSTERECTOMY  02/19/2006   BICEPS TENDON REPAIR Bilateral    FOOT SURGERY     Left   KNEE ARTHROSCOPY Bilateral     ROTATOR CUFF REPAIR Bilateral     Current Outpatient Medications on File Prior to Visit  Medication Sig Dispense Refill   albuterol (VENTOLIN HFA) 108 (90 Base) MCG/ACT inhaler INHALE 2 PUFFS INTO THE LUNGS EVERY 6 HOURS AS NEEDED FOR WHEEZING OR SHORTNESS OF BREATH 8.5 g 3   amLODipine (NORVASC) 5 MG tablet TAKE 1 TABLET(5 MG) BY MOUTH DAILY 90 tablet 2   Dulaglutide (TRULICITY) 3 0000000 SOPN Inject 3 mg as directed once a week. 6 mL 3   Insulin Pen Needle (PEN NEEDLES) 32G X 4 MM MISC 1 pen by Does not apply route 2 (two) times daily. 200 each 3   Lancets (ONETOUCH DELICA PLUS 123XX123) MISC USE TO TEST BLOOD SUGAR EVERY DAY 100 each 1   levocetirizine (XYZAL) 5 MG tablet TAKE 1 TABLET(5 MG) BY MOUTH EVERY EVENING 90 tablet 2   lovastatin (MEVACOR) 20 MG tablet Take 1 tablet (20 mg total) by mouth at bedtime. 90 tablet 3   mometasone (NASONEX) 50 MCG/ACT nasal spray Place 2 sprays into the nose daily. 17 g 4   montelukast (SINGULAIR) 10 MG tablet TAKE 1 TABLET(10 MG) BY MOUTH AT BEDTIME 90 tablet 1   olmesartan-hydrochlorothiazide (BENICAR HCT) 40-25 MG tablet Take 1 tablet by mouth daily. 90 tablet 2   omeprazole (PRILOSEC) 40 MG capsule TAKE 1 CAPSULE(40 MG) BY MOUTH DAILY 90 capsule 0   ONETOUCH VERIO test strip USE TO TEST BLOOD SUGAR EVERY DAY 100 strip 3  tiZANidine (ZANAFLEX) 4 MG tablet Take 1 tablet (4 mg total) by mouth every 8 (eight) hours as needed for muscle spasms. 30 tablet 1   WIXELA INHUB 250-50 MCG/DOSE AEPB INHALE 1 PUFF INTO THE LUNGS TWICE DAILY (Patient taking differently: Inhale 1 puff into the lungs as needed.) 60 each 1   No current facility-administered medications on file prior to visit.     Allergies  Allergen Reactions   Codeine     Unable to Urinate     Social History   Occupational History   Occupation: Software engineer  Tobacco Use   Smoking status: Every Day    Packs/day: 0.50    Types: Cigarettes    Last attempt to quit:  11/25/2018    Years since quitting: 1.8   Smokeless tobacco: Never   Tobacco comments:    down to 3 cigarettes per day  Vaping Use   Vaping Use: Never used  Substance and Sexual Activity   Alcohol use: Yes    Comment: socially   Drug use: No   Sexual activity: Not on file    Family History  Problem Relation Age of Onset   Hyperlipidemia Mother    Stroke Mother    Heart disease Mother    Hypertension Mother    Diabetes Mother    Colon polyps Mother        TA+   Hyperlipidemia Father    Heart disease Father    Stroke Father    Hypertension Father    Diabetes Father    Colon cancer Father        Dx'd in his 88's    Diabetes Brother    Hypertension Maternal Grandmother    Diabetes Mellitus II Maternal Grandmother    Colon cancer Paternal Uncle    Esophageal cancer Neg Hx    Rectal cancer Neg Hx    Stomach cancer Neg Hx     Immunization History  Administered Date(s) Administered   Influenza,inj,Quad PF,6+ Mos 12/12/2016, 11/25/2017, 12/26/2018, 11/24/2019   Moderna SARS-COV2 Booster Vaccination 12/23/2019, 09/13/2020   Moderna Sars-Covid-2 Vaccination 04/30/2019, 06/02/2019   PPD Test 03/11/2016, 10/15/2016   Pneumococcal Polysaccharide-23 08/13/2016   Tdap 04/24/2016    Objective: Vitals:   09/30/20 1613  BP: 114/83  Pulse: 89  SpO2: 98%    Monica [name] is a pleasant 45 y.o. female morbidly obese in NAD. AAO X 3.  Vascular Examination: Capillary refill time to digits immediate b/l. Palpable DP pulse(s) b/l lower extremities Palpable PT pulse(s) b/l lower extremities Pedal hair sparse. Lower extremity skin temperature gradient within normal limits. No pain with calf compression b/l. No edema noted b/l lower extremities.  Dermatological Examination: No open wounds b/l lower extremities. No interdigital macerations b/l lower extremities. Toenails 1-5 b/l elongated, discolored, dystrophic, thickened, crumbly with subungual debris and tenderness to dorsal  palpation. Hyperkeratotic lesion(s) submet head 5 left foot and submet head 5 right foot.  No erythema, no edema, no drainage, no fluctuance. Pedal skin noted to be dry and flaky b/l lower extremities.  Musculoskeletal Examination: Normal muscle strength 5/5 to all lower extremity muscle groups bilaterally. No pain crepitus or joint limitation noted with ROM b/l lower extremities. Pes planus deformity noted b/l lower extremities. Patient ambulates independent of any assistive aids. Wearing appropriate fitting shoe gear.  Footwear Assessment: Does the patient wear appropriate shoes? Yes. Does the patient need inserts/orthotics? No  Neurological Examination: Protective sensation intact 5/5 intact bilaterally with 10g monofilament b/l. Vibratory sensation intact  b/l. Clonus negative b/l.  Lab: Hemoglobin A1C Latest Ref Rng & Units 06/20/2020 01/08/2020  HGBA1C 4.0 - 5.6 % 6.5(A) 6.6(A)  Some recent data might be hidden   Assessment: 1. Pain due to onychomycosis of toenails of both feet   2. Callus   3. Xerosis cutis   4. Type 2 diabetes mellitus with other specified complication, without long-term current use of insulin ([place])   5. Encounter for diabetic foot exam ([place])     ADA Risk Categorization:  Low Risk:  Patient has all of the following: Intact protective sensation No prior foot ulcer  No severe deformity Pedal pulses present  Plan: -Examined patient. -Diabetic foot examination performed today. -Patient to continue soft, supportive shoe gear daily. -For xerosis, prescription for AmLactin lotion 12% was written.  Patient is to apply to both feet twice a day avoiding application in between the toes.  -Discussed topical, laser and oral medication for onychomycosis. Patient opted for laser therapy. Discussed cost and treatment schedule. Patient will schedule first session at his/her convenience. -Recommended Formula 7 Gel for topical management of onychomycosis. Apply to affected  toenail(s) once daily. -Toenails 1-5 b/l were debrided in length and girth with sterile nail nippers and dremel without iatrogenic bleeding.  -Callus(es) submet head 5 left foot and submet head 5 right foot pared utilizing sterile scalpel blade without complication or incident. Total number debrided =2. -Patient to report any pedal injuries to medical professional immediately. -Patient/POA to call should there be question/concern in the interim.  Return in about 3 months (around 12/31/2020).  Marzetta Board, DPM

## 2020-10-17 ENCOUNTER — Other Ambulatory Visit: Payer: Self-pay

## 2020-10-17 ENCOUNTER — Ambulatory Visit: Payer: BC Managed Care – PPO | Admitting: Endocrinology

## 2020-10-17 ENCOUNTER — Telehealth: Payer: Self-pay | Admitting: Endocrinology

## 2020-10-17 VITALS — BP 96/60 | HR 96 | Ht 72.0 in | Wt 322.4 lb

## 2020-10-17 DIAGNOSIS — E1169 Type 2 diabetes mellitus with other specified complication: Secondary | ICD-10-CM

## 2020-10-17 LAB — POCT GLYCOSYLATED HEMOGLOBIN (HGB A1C): Hemoglobin A1C: 7.4 % — AB (ref 4.0–5.6)

## 2020-10-17 MED ORDER — TRULICITY 4.5 MG/0.5ML ~~LOC~~ SOAJ: 4.5000 mg | SUBCUTANEOUS | 3 refills | Status: DC

## 2020-10-17 NOTE — Patient Instructions (Addendum)
check your blood sugar once a day.  vary the time of day when you check, between before the 3 meals, and at bedtime.  also check if you have symptoms of your blood sugar being too high or too low.  please keep a record of the readings and bring it to your next appointment here (or you can bring the meter itself).  You can write it on any piece of paper.  please call us sooner if your blood sugar goes below 70, or if you have a lot of readings over 200.   I have sent a prescription to your pharmacy, to increase the Trulicity again.   Please come back for a follow-up appointment in 3 months.

## 2020-10-17 NOTE — Progress Notes (Signed)
Subjective:    Patient ID: Monica [name], female    DOB: Nov 03, 1975, 45 y.o.   MRN: [id number]  HPI Pt returns for f/u of diabetes mellitus:  DM type: 2 Dx'ed: 99991111 Complications: none.  Therapy: Trulicity.   GDM: 1999 DKA: never Severe hypoglycemia: never Pancreatitis: never Pancreatic imaging: normal on 2003 CT.   SDOH: none Other: she took insulin 2020-2021; victoza dosage was limited by nausea.  She did not tolerate Jardiance (vaginitis) or metformin (abd pain) Interval history: Meter is downloaded today, and the printout is scanned into the record.  cbg's vary from 92-190.  pt states she feels well in general.  She takes meds as rx'ed.  Pt says she was rx'ed for hyperthyroidism in approx 2006.  She took medication x a few years.  It was stopped, due to euthyroidism.  Past Medical History:  Diagnosis Date   Allergy    Anemia    Asthma    Diabetes mellitus without complication ([place]) Q000111Q   GERD (gastroesophageal reflux disease)    Hypertension    OSA (obstructive sleep apnea)    Sleep apnea    no cpap - told no OSA after told did so unsure if + or not     Past Surgical History:  Procedure Laterality Date   ABDOMINAL HYSTERECTOMY  02/19/2006   BICEPS TENDON REPAIR Bilateral    FOOT SURGERY     Left   KNEE ARTHROSCOPY Bilateral    ROTATOR CUFF REPAIR Bilateral     Social History   Socioeconomic History   Marital status: Married    Spouse name: Not on file   Number of children: 1   Years of education: Not on file   Highest education level: Not on file  Occupational History   Occupation: Software engineer  Tobacco Use   Smoking status: Every Day    Packs/day: 0.50    Types: Cigarettes    Last attempt to quit: 11/25/2018    Years since quitting: 1.8   Smokeless tobacco: Never   Tobacco comments:    down to 3 cigarettes per day  Vaping Use   Vaping Use: Never used  Substance and Sexual Activity   Alcohol use: Yes    Comment: socially   Drug  use: No   Sexual activity: Not on file  Other Topics Concern   Not on file  Social History Narrative   Not on file   Social Determinants of Health   Financial Resource Strain: Not on file  Food Insecurity: Not on file  Transportation Needs: Not on file  Physical Activity: Not on file  Stress: Not on file  Social Connections: Not on file  Intimate Partner Violence: Not on file    Current Outpatient Medications on File Prior to Visit  Medication Sig Dispense Refill   albuterol (VENTOLIN HFA) 108 (90 Base) MCG/ACT inhaler INHALE 2 PUFFS INTO THE LUNGS EVERY 6 HOURS AS NEEDED FOR WHEEZING OR SHORTNESS OF BREATH 8.5 g 3   amLODipine (NORVASC) 5 MG tablet TAKE 1 TABLET(5 MG) BY MOUTH DAILY 90 tablet 2   ammonium lactate (AMLACTIN) 12 % cream Apply topically as needed for dry skin. 385 g 6   Lancets (ONETOUCH DELICA PLUS 123XX123) MISC USE TO TEST BLOOD SUGAR EVERY DAY 100 each 1   levocetirizine (XYZAL) 5 MG tablet TAKE 1 TABLET(5 MG) BY MOUTH EVERY EVENING 90 tablet 2   lovastatin (MEVACOR) 20 MG tablet Take 1 tablet (20 mg total) by mouth at  bedtime. 90 tablet 3   mometasone (NASONEX) 50 MCG/ACT nasal spray Place 2 sprays into the nose daily. 17 g 4   montelukast (SINGULAIR) 10 MG tablet TAKE 1 TABLET(10 MG) BY MOUTH AT BEDTIME 90 tablet 1   olmesartan-hydrochlorothiazide (BENICAR HCT) 40-25 MG tablet Take 1 tablet by mouth daily. 90 tablet 2   omeprazole (PRILOSEC) 40 MG capsule TAKE 1 CAPSULE(40 MG) BY MOUTH DAILY 90 capsule 0   ONETOUCH VERIO test strip USE TO TEST BLOOD SUGAR EVERY DAY 100 strip 3   WIXELA INHUB 250-50 MCG/DOSE AEPB INHALE 1 PUFF INTO THE LUNGS TWICE DAILY (Patient taking differently: Inhale 1 puff into the lungs as needed.) 60 each 1   Insulin Pen Needle (PEN NEEDLES) 32G X 4 MM MISC 1 pen by Does not apply route 2 (two) times daily. 200 each 3   tiZANidine (ZANAFLEX) 4 MG tablet Take 1 tablet (4 mg total) by mouth every 8 (eight) hours as needed for muscle spasms.  30 tablet 1   No current facility-administered medications on file prior to visit.    Allergies  Allergen Reactions   Codeine     Unable to Urinate     Family History  Problem Relation Age of Onset   Hyperlipidemia Mother    Stroke Mother    Heart disease Mother    Hypertension Mother    Diabetes Mother    Colon polyps Mother        TA+   Hyperlipidemia Father    Heart disease Father    Stroke Father    Hypertension Father    Diabetes Father    Colon cancer Father        Dx'd in his 39's    Diabetes Brother    Hypertension Maternal Grandmother    Diabetes Mellitus II Maternal Grandmother    Colon cancer Paternal Uncle    Esophageal cancer Neg Hx    Rectal cancer Neg Hx    Stomach cancer Neg Hx     BP 96/60 (BP Location: Right Arm, Patient Position: Sitting, Cuff Size: Large)   Pulse 96   Ht 6' (1.829 m)   Wt (!) 322 lb 6.4 oz (146.2 kg)   SpO2 96%   BMI 43.73 kg/m   Review of Systems Denies n/v/hb.  She denies hypoglycemia.      Objective:   Physical Exam Pulses: dorsalis pedis intact bilat.   MSK: no deformity of the feet CV: no leg edema Skin:  no ulcer on the feet.  normal color and temp on the feet. Neuro: sensation is intact to touch on the feet Ext: there is bilateral onychomycosis of the toenails.   A1c=7.4%    Assessment & Plan:  Type 2 DM: uncontrolled.  HTN: overcontrolled.  D/c norvasc.   Patient Instructions  check your blood sugar once a day.  vary the time of day when you check, between before the 3 meals, and at bedtime.  also check if you have symptoms of your blood sugar being too high or too low.  please keep a record of the readings and bring it to your next appointment here (or you can bring the meter itself).  You can write it on any piece of paper.  please call us sooner if your blood sugar goes below 70, or if you have a lot of readings over 200.   I have sent a prescription to your pharmacy, to increase the Trulicity again.    Please come back for a  follow-up appointment in 3 months.

## 2020-10-17 NOTE — Telephone Encounter (Signed)
please contact patient: BP is a little low.  D/c amlodipine.   Please see your primary care provider soon, to have it rechecked

## 2020-10-18 NOTE — Telephone Encounter (Signed)
Message sent thru MyChart 

## 2020-10-25 ENCOUNTER — Other Ambulatory Visit: Payer: Self-pay

## 2020-10-26 ENCOUNTER — Encounter: Payer: Self-pay | Admitting: Family Medicine

## 2020-10-26 ENCOUNTER — Ambulatory Visit: Payer: BC Managed Care – PPO | Admitting: Family Medicine

## 2020-10-26 VITALS — BP 130/80 | HR 97 | Resp 16 | Ht 72.0 in | Wt 325.0 lb

## 2020-10-26 DIAGNOSIS — Z23 Encounter for immunization: Secondary | ICD-10-CM | POA: Diagnosis not present

## 2020-10-26 DIAGNOSIS — R233 Spontaneous ecchymoses: Secondary | ICD-10-CM

## 2020-10-26 DIAGNOSIS — I10 Essential (primary) hypertension: Secondary | ICD-10-CM | POA: Diagnosis not present

## 2020-10-26 DIAGNOSIS — R252 Cramp and spasm: Secondary | ICD-10-CM

## 2020-10-26 DIAGNOSIS — R238 Other skin changes: Secondary | ICD-10-CM | POA: Diagnosis not present

## 2020-10-26 LAB — COMPREHENSIVE METABOLIC PANEL
ALT: 17 U/L (ref 0–35)
AST: 18 U/L (ref 0–37)
Albumin: 3.8 g/dL (ref 3.5–5.2)
Alkaline Phosphatase: 61 U/L (ref 39–117)
BUN: 15 mg/dL (ref 6–23)
CO2: 27 mEq/L (ref 19–32)
Calcium: 9.6 mg/dL (ref 8.4–10.5)
Chloride: 100 mEq/L (ref 96–112)
Creatinine, Ser: 0.89 mg/dL (ref 0.40–1.20)
GFR: 77.13 mL/min (ref >60.00)
Glucose, Bld: 134 mg/dL — ABNORMAL HIGH (ref 70–99)
Potassium: 4.3 mEq/L (ref 3.5–5.1)
Sodium: 134 mEq/L — ABNORMAL LOW (ref 135–145)
Total Bilirubin: 0.3 mg/dL (ref 0.2–1.2)
Total Protein: 7.3 g/dL (ref 6.0–8.3)

## 2020-10-26 LAB — CBC WITH DIFFERENTIAL/PLATELET
Basophils Absolute: 0 10*3/uL (ref 0.0–0.1)
Basophils Relative: 0.8 % (ref 0.0–3.0)
Eosinophils Absolute: 0.1 10*3/uL (ref 0.0–0.7)
Eosinophils Relative: 2.5 % (ref 0.0–5.0)
HCT: 36.3 % (ref 36.0–46.0)
Hemoglobin: 11.8 g/dL — ABNORMAL LOW (ref 12.0–15.0)
Lymphocytes Relative: 49.4 % — ABNORMAL HIGH (ref 12.0–46.0)
Lymphs Abs: 2.5 10*3/uL (ref 0.7–4.0)
MCHC: 32.5 g/dL (ref 30.0–36.0)
MCV: 87.1 fl (ref 78.0–100.0)
Monocytes Absolute: 0.4 10*3/uL (ref 0.1–1.0)
Monocytes Relative: 8 % (ref 3.0–12.0)
Neutro Abs: 2 10*3/uL (ref 1.4–7.7)
Neutrophils Relative %: 39.3 % — ABNORMAL LOW (ref 43.0–77.0)
Platelets: 193 10*3/uL (ref 150.0–400.0)
RBC: 4.17 Mil/uL (ref 3.87–5.11)
RDW: 15.1 % (ref 11.5–15.5)
WBC: 5.1 10*3/uL (ref 4.0–10.5)

## 2020-10-26 LAB — TSH: TSH: 1.73 u[IU]/mL (ref 0.35–5.50)

## 2020-10-26 LAB — CK: Total CK: 185 U/L — ABNORMAL HIGH (ref 7–177)

## 2020-10-26 MED ORDER — TIZANIDINE HCL 4 MG PO TABS: 2.0000 mg | ORAL_TABLET | Freq: Every day | ORAL | 0 refills | Status: AC

## 2020-10-26 NOTE — Assessment & Plan Note (Addendum)
Wt has been stable. She understands the benefits of wt loss as well as adverse effects of obesity. Consistency with healthy diet and physical activity recommended. Keeping a food diary may help.

## 2020-10-26 NOTE — Assessment & Plan Note (Signed)
BP adequately controlled. Continue current management: Amlodipine 5 mg daily and Olmesartan-HCTZ 40-25 mg daily. DASH/low salt diet recommended. Monitor BP at home.

## 2020-10-26 NOTE — Progress Notes (Signed)
ACUTE VISIT Chief Complaint  Patient presents with   cramping in legs    X a few weeks, waking pt up at night   HPI: [name] is a 45 y.o. female with hx of asthma,DM II,HLD,HTN,and GERD here today complaining of lower extremity cramps as described above.  She was last seen on 06/01/20.  This is a new problem that is started 2 to 3 weeks ago. It happens about 2 times per week at least. Interferes with sleep/  One episode may last up to 45 min. Massage and stretching do not help.  Gradual onset, muscle cramps starts behind knees and spreads to inner and anterior aspect of thighs. It is bilateral. She has not noted edema or erythema.  She has not identified exacerbating or alleviating factors. Has increased fluid intake but it does not seem to help.  Hx of back pain, occasionally she has LLE tingling sensation,stable for a while. Negative for saddle anesthesia or bowel/bladder dysfunction.  No new medications. Her trulicity has been increased because poorly controlled DM II but she was already having cramps.   Still exercising regularly. She has not been consistent with following a healthful diet. She has been traveling for work, so it has been hard to follow dietary recommendations.  For the past few weeks she has noted ecchymoses on upper extremities, no known history of trauma."Random bruising", not tender.  Negative for nose/gum bleeding, blood in the stool, melena, or blood hematuria. Negative for night sweats, fever, or abnormal weight loss.  Lab Results  Component Value Date   TSH 1.63 01/08/2020   Lab Results  Component Value Date   WBC 3.9 (L) 11/01/2010   HGB 13.3 11/01/2010   HCT 39.0 11/01/2010   MCV 85.0 11/01/2010   PLT 174 11/01/2010   HTN on olmesartan-HCTZ 40-25 mg daily and amlodipine 5 mg daily. Negative for severe/frequent headache, visual changes, chest pain, dyspnea, palpitation, or focal weakness.  Lab Results  Component Value  Date   CREATININE 0.87 06/01/2020   BUN 13 06/01/2020   NA 139 06/01/2020   K 4.1 06/01/2020   CL 102 06/01/2020   CO2 29 06/01/2020   Review of Systems  Constitutional:  Positive for fatigue. Negative for activity change, appetite change and fever.  HENT:  Negative for mouth sores, nosebleeds and trouble swallowing.   Respiratory:  Negative for cough and wheezing.   Gastrointestinal:  Negative for abdominal pain, nausea and vomiting.       Negative for changes in bowel habits.  Allergic/Immunologic: Positive for environmental allergies.  Neurological:  Negative for syncope, facial asymmetry and weakness.  Hematological:  Negative for adenopathy.  Psychiatric/Behavioral:  Positive for sleep disturbance. Negative for confusion.   Rest see pertinent positives and negatives per HPI.  Current Outpatient Medications on File Prior to Visit  Medication Sig Dispense Refill   albuterol (VENTOLIN HFA) 108 (90 Base) MCG/ACT inhaler INHALE 2 PUFFS INTO THE LUNGS EVERY 6 HOURS AS NEEDED FOR WHEEZING OR SHORTNESS OF BREATH 8.5 g 3   amLODipine (NORVASC) 5 MG tablet TAKE 1 TABLET(5 MG) BY MOUTH DAILY 90 tablet 2   ammonium lactate (AMLACTIN) 12 % cream Apply topically as needed for dry skin. 385 g 6   Dulaglutide (TRULICITY) 4.5 0000000 SOPN Inject 4.5 mg as directed once a week. 6 mL 3   Lancets (ONETOUCH DELICA PLUS 123XX123) MISC USE TO TEST BLOOD SUGAR EVERY DAY 100 each 1   levocetirizine (XYZAL) 5 MG tablet TAKE  1 TABLET(5 MG) BY MOUTH EVERY EVENING 90 tablet 2   lovastatin (MEVACOR) 20 MG tablet Take 1 tablet (20 mg total) by mouth at bedtime. 90 tablet 3   mometasone (NASONEX) 50 MCG/ACT nasal spray Place 2 sprays into the nose daily. 17 g 4   montelukast (SINGULAIR) 10 MG tablet TAKE 1 TABLET(10 MG) BY MOUTH AT BEDTIME 90 tablet 1   olmesartan-hydrochlorothiazide (BENICAR HCT) 40-25 MG tablet Take 1 tablet by mouth daily. 90 tablet 2   omeprazole (PRILOSEC) 40 MG capsule TAKE 1 CAPSULE(40  MG) BY MOUTH DAILY 90 capsule 0   ONETOUCH VERIO test strip USE TO TEST BLOOD SUGAR EVERY DAY 100 strip 3   WIXELA INHUB 250-50 MCG/DOSE AEPB INHALE 1 PUFF INTO THE LUNGS TWICE DAILY (Patient taking differently: Inhale 1 puff into the lungs as needed.) 60 each 1   No current facility-administered medications on file prior to visit.   Past Medical History:  Diagnosis Date   Allergy    Anemia    Asthma    Diabetes mellitus without complication ([place]) [id number]   GERD (gastroesophageal reflux disease)    Hypertension    OSA (obstructive sleep apnea)    Sleep apnea    no cpap - told no OSA after told did so unsure if + or not    Allergies  Allergen Reactions   Codeine     Unable to Urinate     Social History   Socioeconomic History   Marital status: Married    Spouse name: Not on file   Number of children: 1   Years of education: Not on file   Highest education level: Not on file  Occupational History   Occupation: Software engineer  Tobacco Use   Smoking status: Every Day    Packs/day: 0.50    Types: Cigarettes    Last attempt to quit: 11/25/2018    Years since quitting: 1.9   Smokeless tobacco: Never   Tobacco comments:    down to 3 cigarettes per day  Vaping Use   Vaping Use: Never used  Substance and Sexual Activity   Alcohol use: Yes    Comment: socially   Drug use: No   Sexual activity: Not on file  Other Topics Concern   Not on file  Social History Narrative   Not on file   Social Determinants of Health   Financial Resource Strain: Not on file  Food Insecurity: Not on file  Transportation Needs: Not on file  Physical Activity: Not on file  Stress: Not on file  Social Connections: Not on file   Vitals:   10/26/20 1041  BP: 130/80  Pulse: 97  Resp: 16  SpO2: 98%   Wt Readings from Last 3 Encounters:  10/26/20 (!) 325 lb (147.4 kg)  10/17/20 (!) 322 lb 6.4 oz (146.2 kg)  08/09/20 (!) 325 lb (147.4 kg)   Body mass index is 44.08  kg/m.  Physical Exam Vitals and nursing note reviewed.  Constitutional:      General: She is not in acute distress.    Appearance: She is well-developed.  HENT:     Head: Normocephalic and atraumatic.     Mouth/Throat:     Mouth: Mucous membranes are moist.     Pharynx: Oropharynx is clear.  Eyes:     Conjunctiva/sclera: Conjunctivae normal.  Cardiovascular:     Rate and Rhythm: Normal rate and regular rhythm.     Pulses:  Dorsalis pedis pulses are 2+ on the right side and 2+ on the left side.     Heart sounds: No murmur heard.    Comments: Varicose veins LE, bilateral. Pulmonary:     Effort: Pulmonary effort is normal. No respiratory distress.     Breath sounds: Normal breath sounds.  Abdominal:     Palpations: Abdomen is soft. There is no hepatomegaly or mass.     Tenderness: There is no abdominal tenderness.  Musculoskeletal:     Right lower leg: No edema.     Left lower leg: No edema.  Lymphadenopathy:     Cervical: No cervical adenopathy.  Skin:    General: Skin is warm.     Findings: No ecchymosis, erythema or rash.  Neurological:     General: No focal deficit present.     Mental Status: She is alert and oriented to person, place, and time.     Cranial Nerves: No cranial nerve deficit.     Gait: Gait normal.  Psychiatric:     Comments: Well groomed, good eye contact.   ASSESSMENT AND PLAN:  [name] was seen today for cramping in legs.  Diagnoses and all orders for this visit: Orders Placed This Encounter  Procedures   Flu Vaccine QUAD 70moIM (Fluarix, Fluzone & Alfiuria Quad PF)   Comprehensive metabolic panel   CBC with Differential/Platelet   CK   TSH   Lab Results  Component Value Date   TSH 1.73 10/26/2020   Lab Results  Component Value Date   CREATININE 0.89 10/26/2020   BUN 15 10/26/2020   NA 134 (L) 10/26/2020   K 4.3 10/26/2020   CL 100 10/26/2020   CO2 27 10/26/2020   Lab Results  Component Value Date   ALT 17 10/26/2020    AST 18 10/26/2020   ALKPHOS 61 10/26/2020   BILITOT 0.3 10/26/2020   Lab Results  Component Value Date   WBC 5.1 10/26/2020   HGB 11.8 (L) 10/26/2020   HCT 36.3 10/26/2020   MCV 87.1 10/26/2020   PLT 193.0 10/26/2020   Bilateral leg cramps We discussed possible etiologies, electrolyte abnormalities,dehydration,and medications among some to consider. Explained that frequently etiology cannot be identified. Treatment options reviewed. She agrees with trying Zanaflex at bedtime x 3 weeks and let me know if it helps. If persistent, she could hold her statin for 3 weeks and monitor for changes. Adequate hydration. Further recommendations according to lab results.  -     tiZANidine (ZANAFLEX) 4 MG tablet; Take 0.5-1 tablets (2-4 mg total) by mouth at bedtime for 21 days.  Hypertension, essential, benign BP adequately controlled. Continue current management: Amlodipine 5 mg daily and Olmesartan-HCTZ 40-25 mg daily. DASH/low salt diet recommended. Monitor BP at home.   Morbid obesity ([place] Wt has been stable. She understands the benefits of wt loss as well as adverse effects of obesity. Consistency with healthy diet and physical activity recommended.  Easy bruising We discussed possible etiologies. No ecchymosis present today. Hx and examination today do not suggest a serious process. Monitor for new symptoms.  Need for influenza vaccination -     Flu Vaccine QUAD 619moM (Fluarix, Fluzone & Alfiuria Quad PF)   Return in about 7 months (around 06/01/2021) for cpe.  Anarosa Kubisiak G. JoMartiniqueMD  [facility].

## 2020-10-26 NOTE — Patient Instructions (Signed)
A few things to remember from today's visit:   Need for influenza vaccination - Plan: Flu Vaccine QUAD 54moIM (Fluarix, Fluzone & Alfiuria Quad PF)  Hypertension, essential, benign - Plan: Comprehensive metabolic panel  Bilateral leg cramps - Plan: CBC with Differential/Platelet, CK, TSH, tiZANidine (ZANAFLEX) 4 MG tablet  Easy bruising - Plan: Comprehensive metabolic panel, CBC with Differential/Platelet  If you need refills please call your pharmacy. Do not use My Chart to request refills or for acute issues that need immediate attention.   Try Zanaflex at bedtime for 3 weeks and monitor for changes in cramps. Stretching and warming before and after exercise may help. Adequate hydration. Leg Cramps Leg cramps occur when one or more muscles tighten and a person has no control over it (involuntary muscle contraction). Muscle cramps are most common in the calf muscles of the leg. They can occur during exercise or at rest. Leg cramps are painful, and they may last for a few seconds to a few minutes. Cramps may return several times before they finally stop. Usually, leg cramps are not caused by a serious medical problem. In many cases, the cause is not known. Some common causes include: Excessive physical effort (overexertion), such as during intense exercise. Doing the same motion over and over. Staying in a certain position for a long period of time. Improper preparation, form, or technique while doing a sport or an activity. Dehydration. Injury. Side effects of certain medicines. Abnormally low levels of minerals in your blood (electrolytes), especially potassium and calcium. This could result from: Pregnancy. Taking diuretic medicines. Follow these instructions at home: Eating and drinking Drink enough fluid to keep your urine pale yellow. Staying hydrated may help prevent cramps. Eat a healthy diet that includes plenty of nutrients to help your muscles function. A healthy diet  includes fruits and vegetables, lean protein, whole grains, and low-fat or nonfat dairy products. Managing pain, stiffness, and swelling   Try massaging, stretching, and relaxing the affected muscle. Do this for several minutes at a time. If directed, put ice on areas that are sore or painful after a cramp. To do this: Put ice in a plastic bag. Place a towel between your skin and the bag. Leave the ice on for 20 minutes, 2-3 times a day. Remove the ice if your skin turns bright red. This is very important. If you cannot feel pain, heat, or cold, you have a greater risk of damage to the area. If directed, apply heat to muscles that are tense or tight. Do this before you exercise, or as often as told by your health care provider. Use the heat source that your health care provider recommends, such as a moist heat pack or a heating pad. To do this: Place a towel between your skin and the heat source. Leave the heat on for 20-30 minutes. Remove the heat if your skin turns bright red. This is especially important if you are unable to feel pain, heat, or cold. You may have a greater risk of getting burned. Try taking hot showers or baths to help relax tight muscles. General instructions If you are having frequent leg cramps, avoid intense exercise for several days. Take over-the-counter and prescription medicines only as told by your health care provider. Keep all follow-up visits. This is important. Contact a health care provider if: Your leg cramps get more severe or more frequent, or they do not improve over time. Your foot becomes cold, numb, or blue. Summary Muscle cramps can develop  in any muscle, but the most common place is in the calf muscles of the leg. Leg cramps are painful, and they may last for a few seconds to a few minutes. Usually, leg cramps are not caused by a serious medical problem. Often, the cause is not known. Stay hydrated, and take over-the-counter and prescription  medicines only as told by your health care provider. This information is not intended to replace advice given to you by your health care provider. Make sure you discuss any questions you have with your health care provider. Document Revised: 06/24/2019 Document Reviewed: 06/24/2019 Elsevier Patient Education  [place].  Please be sure medication list is accurate. If a new problem present, please set up appointment sooner than planned today.

## 2020-10-28 ENCOUNTER — Other Ambulatory Visit: Payer: BC Managed Care – PPO

## 2020-10-28 NOTE — Telephone Encounter (Signed)
Pt received lab results yesterday evening from pcp.

## 2020-11-09 ENCOUNTER — Encounter: Payer: Self-pay | Admitting: Endocrinology

## 2020-11-11 ENCOUNTER — Other Ambulatory Visit: Payer: BC Managed Care – PPO

## 2020-11-17 ENCOUNTER — Ambulatory Visit (INDEPENDENT_AMBULATORY_CARE_PROVIDER_SITE_OTHER): Payer: BC Managed Care – PPO | Admitting: Adult Health

## 2020-11-17 ENCOUNTER — Encounter: Payer: Self-pay | Admitting: Adult Health

## 2020-11-17 ENCOUNTER — Other Ambulatory Visit: Payer: Self-pay

## 2020-11-17 ENCOUNTER — Other Ambulatory Visit: Payer: Self-pay | Admitting: Family Medicine

## 2020-11-17 VITALS — BP 100/78 | HR 83 | Temp 98.7°F | Ht 72.0 in | Wt 325.0 lb

## 2020-11-17 DIAGNOSIS — J0140 Acute pansinusitis, unspecified: Secondary | ICD-10-CM

## 2020-11-17 DIAGNOSIS — R599 Enlarged lymph nodes, unspecified: Secondary | ICD-10-CM

## 2020-11-17 DIAGNOSIS — K219 Gastro-esophageal reflux disease without esophagitis: Secondary | ICD-10-CM

## 2020-11-17 MED ORDER — DOXYCYCLINE HYCLATE 100 MG PO CAPS: 100.0000 mg | ORAL_CAPSULE | Freq: Two times a day (BID) | ORAL | 0 refills | Status: DC

## 2020-11-17 NOTE — Progress Notes (Signed)
Subjective:    Patient ID: Monica [name], female    DOB: 1975-08-17, 45 y.o.   MRN: [id number]  HPI 45 year old female who  has a past medical history of Allergy, Anemia, Asthma, Diabetes mellitus without complication ([place]) (00/8676), GERD (gastroesophageal reflux disease), Hypertension, OSA (obstructive sleep apnea), and Sleep apnea.  She is a patient of Dr. Martinique who I am seeing today for an acute issue. She reports a " knot" on the right side of neck x 5 days. This knot is painful. Associated symptoms include sinus pain/pressure, headaches, runny nose. Denies fever or chills.    Review of Systems See HPI   Past Medical History:  Diagnosis Date  . Allergy   . Anemia   . Asthma   . Diabetes mellitus without complication ([place]) 19/5093  . GERD (gastroesophageal reflux disease)   . Hypertension   . OSA (obstructive sleep apnea)   . Sleep apnea    no cpap - told no OSA after told did so unsure if + or not     Social History   Socioeconomic History  . Marital status: Married    Spouse name: Not on file  . Number of children: 1  . Years of education: Not on file  . Highest education level: Not on file  Occupational History  . Occupation: Software engineer  Tobacco Use  . Smoking status: Every Day    Packs/day: 0.50    Types: Cigarettes    Last attempt to quit: 11/25/2018    Years since quitting: 1.9  . Smokeless tobacco: Never  . Tobacco comments:    down to 3 cigarettes per day  Vaping Use  . Vaping Use: Never used  Substance and Sexual Activity  . Alcohol use: Yes    Comment: socially  . Drug use: No  . Sexual activity: Not on file  Other Topics Concern  . Not on file  Social History Narrative  . Not on file   Social Determinants of Health   Financial Resource Strain: Not on file  Food Insecurity: Not on file  Transportation Needs: Not on file  Physical Activity: Not on file  Stress: Not on file  Social Connections: Not on file  Intimate  Partner Violence: Not on file    Past Surgical History:  Procedure Laterality Date  . ABDOMINAL HYSTERECTOMY  02/19/2006  . BICEPS TENDON REPAIR Bilateral   . FOOT SURGERY     Left  . KNEE ARTHROSCOPY Bilateral   . ROTATOR CUFF REPAIR Bilateral     Family History  Problem Relation Age of Onset  . Hyperlipidemia Mother   . Stroke Mother   . Heart disease Mother   . Hypertension Mother   . Diabetes Mother   . Colon polyps Mother        TA+  . Hyperlipidemia Father   . Heart disease Father   . Stroke Father   . Hypertension Father   . Diabetes Father   . Colon cancer Father        Dx'd in his 66's   . Diabetes Brother   . Hypertension Maternal Grandmother   . Diabetes Mellitus II Maternal Grandmother   . Colon cancer Paternal Uncle   . Esophageal cancer Neg Hx   . Rectal cancer Neg Hx   . Stomach cancer Neg Hx     Allergies  Allergen Reactions  . Codeine     Unable to Urinate     Current Outpatient Medications  on File Prior to Visit  Medication Sig Dispense Refill  . albuterol (VENTOLIN HFA) 108 (90 Base) MCG/ACT inhaler INHALE 2 PUFFS INTO THE LUNGS EVERY 6 HOURS AS NEEDED FOR WHEEZING OR SHORTNESS OF BREATH 8.5 g 3  . amLODipine (NORVASC) 5 MG tablet TAKE 1 TABLET(5 MG) BY MOUTH DAILY 90 tablet 2  . ammonium lactate (AMLACTIN) 12 % cream Apply topically as needed for dry skin. 385 g 6  . Dulaglutide (TRULICITY) 4.5 FW/2.6VZ SOPN Inject 4.5 mg as directed once a week. 6 mL 3  . Lancets (ONETOUCH DELICA PLUS [id number]) MISC USE TO TEST BLOOD SUGAR EVERY DAY 100 each 1  . levocetirizine (XYZAL) 5 MG tablet TAKE 1 TABLET(5 MG) BY MOUTH EVERY EVENING 90 tablet 2  . lovastatin (MEVACOR) 20 MG tablet Take 1 tablet (20 mg total) by mouth at bedtime. 90 tablet 3  . mometasone (NASONEX) 50 MCG/ACT nasal spray Place 2 sprays into the nose daily. 17 g 4  . montelukast (SINGULAIR) 10 MG tablet TAKE 1 TABLET(10 MG) BY MOUTH AT BEDTIME 90 tablet 1  .  olmesartan-hydrochlorothiazide (BENICAR HCT) 40-25 MG tablet Take 1 tablet by mouth daily. 90 tablet 2  . omeprazole (PRILOSEC) 40 MG capsule TAKE 1 CAPSULE(40 MG) BY MOUTH DAILY 90 capsule 0  . ONETOUCH VERIO test strip USE TO TEST BLOOD SUGAR EVERY DAY 100 strip 3  . WIXELA INHUB 250-50 MCG/DOSE AEPB INHALE 1 PUFF INTO THE LUNGS TWICE DAILY (Patient taking differently: Inhale 1 puff into the lungs as needed.) 60 each 1   No current facility-administered medications on file prior to visit.    BP 100/78   Pulse 83   Temp 98.7 F (37.1 C) (Oral)   Ht 6' (1.829 m)   Wt (!) 325 lb (147.4 kg)   SpO2 98%   BMI 44.08 kg/m       Objective:   Physical Exam Vitals and nursing note reviewed.  Constitutional:      Appearance: Normal appearance.  HENT:     Right Ear: Tympanic membrane, ear canal and external ear normal. There is no impacted cerumen.     Left Ear: Tympanic membrane, ear canal and external ear normal. There is no impacted cerumen.     Nose: Congestion and rhinorrhea present.     Right Sinus: Maxillary sinus tenderness and frontal sinus tenderness present.     Left Sinus: Maxillary sinus tenderness and frontal sinus tenderness present.     Mouth/Throat:     Mouth: Mucous membranes are moist.     Pharynx: Oropharynx is clear. No oropharyngeal exudate or posterior oropharyngeal erythema.  Pulmonary:     Effort: Pulmonary effort is normal.     Breath sounds: Normal breath sounds.  Lymphadenopathy:     Head:     Right side of head: Preauricular adenopathy present.  Skin:    General: Skin is warm and dry.  Neurological:     Mental Status: She is alert.  Psychiatric:        Mood and Affect: Mood normal.        Behavior: Behavior normal.        Thought Content: Thought content normal.        Judgment: Judgment normal.       Assessment & Plan:  1. Acute non-recurrent pansinusitis - Will treat due to symptoms.  - doxycycline (VIBRAMYCIN) 100 MG capsule; Take 1 capsule  (100 mg total) by mouth 2 (two) times daily.  Dispense: 20 capsule; Refill: 0 -  Follow up if no improvement in the next 2-3 days  2. Swollen lymph nodes - From sinusitis

## 2020-12-03 ENCOUNTER — Other Ambulatory Visit: Payer: Self-pay | Admitting: Family Medicine

## 2020-12-03 DIAGNOSIS — J309 Allergic rhinitis, unspecified: Secondary | ICD-10-CM

## 2020-12-28 ENCOUNTER — Other Ambulatory Visit: Payer: Self-pay | Admitting: Family Medicine

## 2020-12-28 DIAGNOSIS — Z1231 Encounter for screening mammogram for malignant neoplasm of breast: Secondary | ICD-10-CM

## 2021-01-06 ENCOUNTER — Ambulatory Visit (INDEPENDENT_AMBULATORY_CARE_PROVIDER_SITE_OTHER): Payer: BC Managed Care – PPO | Admitting: Podiatry

## 2021-01-06 DIAGNOSIS — Z91199 Patient's noncompliance with other medical treatment and regimen due to unspecified reason: Secondary | ICD-10-CM

## 2021-01-11 NOTE — Progress Notes (Signed)
   Complete physical exam  Patient: Monica [name]   DOB: 12/09/2000   45 y.o. Female  MRN: [id number]  Subjective:    No chief complaint on file.   Monica [name] is a 45 y.o. female who presents today for a complete physical exam. She reports consuming a {diet types:17450} diet. {types:19826} She generally feels {DESC; WELL/FAIRLY WELL/POORLY:18703}. She reports sleeping {DESC; WELL/FAIRLY WELL/POORLY:18703}. She {does/does not:200015} have additional problems to discuss today.    Most recent fall risk assessment:    08/16/2021   10:42 AM  Fall Risk   Falls in the past year? 0  Number falls in past yr: 0  Injury with Fall? 0  Risk for fall due to : No Fall Risks  Follow up Falls evaluation completed     Most recent depression screenings:    08/16/2021   10:42 AM 07/07/2020   10:46 AM  PHQ 2/9 Scores  PHQ - 2 Score 0 0  PHQ- 9 Score 5     {VISON DENTAL STD PSA (Optional):27386}  {History (Optional):23778}  Patient Care Team: Jessup, Joy, NP as PCP - General (Nurse Practitioner)   Outpatient Medications Prior to Visit  Medication Sig   fluticasone (FLONASE) 50 MCG/ACT nasal spray Place 2 sprays into both nostrils in the morning and at bedtime. After 7 days, reduce to once daily.   norgestimate-ethinyl estradiol (SPRINTEC 28) 0.25-35 MG-MCG tablet Take 1 tablet by mouth daily.   Nystatin POWD Apply liberally to affected area 2 times per day   spironolactone (ALDACTONE) 100 MG tablet Take 1 tablet (100 mg total) by mouth daily.   No facility-administered medications prior to visit.    ROS        Objective:     There were no vitals taken for this visit. {Vitals History (Optional):23777}  Physical Exam   No results found for any visits on 09/21/21. {Show previous labs (optional):23779}    Assessment & Plan:    Routine Health Maintenance and Physical Exam  Immunization History  Administered Date(s) Administered   DTaP 02/22/1999, 04/20/1999,  06/29/1999, 03/14/2000, 09/28/2003   Hepatitis A 07/25/2007, 07/30/2008   Hepatitis B 12/10/1998, 01/17/1999, 06/29/1999   HiB (PRP-OMP) 02/22/1999, 04/20/1999, 06/29/1999, 03/14/2000   IPV 02/22/1999, 04/20/1999, 12/18/1999, 09/28/2003   Influenza,inj,Quad PF,6+ Mos 10/30/2013   Influenza-Unspecified 01/30/2012   MMR 12/17/2000, 09/28/2003   Meningococcal Polysaccharide 07/30/2011   Pneumococcal Conjugate-13 03/14/2000   Pneumococcal-Unspecified 06/29/1999, 09/12/1999   Tdap 07/30/2011   Varicella 12/18/1999, 07/25/2007    Health Maintenance  Topic Date Due   HIV Screening  Never done   Hepatitis C Screening  Never done   INFLUENZA VACCINE  09/19/2021   PAP-Cervical Cytology Screening  09/21/2021 (Originally 12/09/2019)   PAP SMEAR-Modifier  09/21/2021 (Originally 12/09/2019)   TETANUS/TDAP  09/21/2021 (Originally 07/29/2021)   HPV VACCINES  Discontinued   COVID-19 Vaccine  Discontinued    Discussed health benefits of physical activity, and encouraged her to engage in regular exercise appropriate for her age and condition.  Problem List Items Addressed This Visit   None Visit Diagnoses     Annual physical exam    -  Primary   Cervical cancer screening       Need for Tdap vaccination          No follow-ups on file.     Joy Jessup, NP   

## 2021-01-18 ENCOUNTER — Ambulatory Visit: Payer: BC Managed Care – PPO | Admitting: Endocrinology

## 2021-01-18 ENCOUNTER — Other Ambulatory Visit: Payer: Self-pay

## 2021-01-18 VITALS — BP 94/64 | HR 94 | Ht 72.0 in | Wt 319.6 lb

## 2021-01-18 DIAGNOSIS — E1169 Type 2 diabetes mellitus with other specified complication: Secondary | ICD-10-CM

## 2021-01-18 LAB — POCT GLYCOSYLATED HEMOGLOBIN (HGB A1C): Hemoglobin A1C: 7.2 % — AB (ref 4.0–5.6)

## 2021-01-18 MED ORDER — CYCLOSET 0.8 MG PO TABS: 0.8000 mg | ORAL_TABLET | Freq: Every day | ORAL | 3 refills | Status: DC

## 2021-01-18 NOTE — Patient Instructions (Signed)
check your blood sugar once a day.  vary the time of day when you check, between before the 3 meals, and at bedtime.  also check if you have symptoms of your blood sugar being too high or too low.  please keep a record of the readings and bring it to your next appointment here (or you can bring the meter itself).  You can write it on any piece of paper.  please call us sooner if your blood sugar goes below 70, or if you have a lot of readings over 200.   I have sent a prescription to your pharmacy, to add bromocriptine.   Please continue the same Trulicity. Please come back for a follow-up appointment in 3 months.

## 2021-01-18 NOTE — Progress Notes (Signed)
Subjective:    Patient ID: Monica [name], female    DOB: 1975/10/30, 45 y.o.   MRN: [id number]  HPI Pt returns for f/u of diabetes mellitus:  DM type: 2 Dx'ed: 1093 Complications: none.  Therapy: Trulicity.   GDM: 1999 DKA: never Severe hypoglycemia: never Pancreatitis: never Pancreatic imaging: normal on 2003 CT.   SDOH: none Other: she took insulin 2020-2021; Victoza dosage was limited by nausea.  She did not tolerate Jardiance (vaginitis) or metformin (abd pain); edema limits rx options.   Interval history: pt says cbg varies from 120-145.  pt states she feels well in general.  She takes meds as rx'ed.   Pt says she was rx'ed for hyperthyroidism in approx 2006.  She took medication x a few years.  It was stopped, due to euthyroidism.   Past Medical History:  Diagnosis Date   Allergy    Anemia    Asthma    Diabetes mellitus without complication ([place]) 23/5573   GERD (gastroesophageal reflux disease)    Hypertension    OSA (obstructive sleep apnea)    Sleep apnea    no cpap - told no OSA after told did so unsure if + or not     Past Surgical History:  Procedure Laterality Date   ABDOMINAL HYSTERECTOMY  02/19/2006   BICEPS TENDON REPAIR Bilateral    FOOT SURGERY     Left   KNEE ARTHROSCOPY Bilateral    ROTATOR CUFF REPAIR Bilateral     Social History   Socioeconomic History   Marital status: Married    Spouse name: Not on file   Number of children: 1   Years of education: Not on file   Highest education level: Not on file  Occupational History   Occupation: Software engineer  Tobacco Use   Smoking status: Every Day    Packs/day: 0.50    Types: Cigarettes    Last attempt to quit: 11/25/2018    Years since quitting: 2.1   Smokeless tobacco: Never   Tobacco comments:    down to 3 cigarettes per day  Vaping Use   Vaping Use: Never used  Substance and Sexual Activity   Alcohol use: Yes    Comment: socially   Drug use: No   Sexual activity: Not  on file  Other Topics Concern   Not on file  Social History Narrative   Not on file   Social Determinants of Health   Financial Resource Strain: Not on file  Food Insecurity: Not on file  Transportation Needs: Not on file  Physical Activity: Not on file  Stress: Not on file  Social Connections: Not on file  Intimate Partner Violence: Not on file    Current Outpatient Medications on File Prior to Visit  Medication Sig Dispense Refill   albuterol (VENTOLIN HFA) 108 (90 Base) MCG/ACT inhaler INHALE 2 PUFFS INTO THE LUNGS EVERY 6 HOURS AS NEEDED FOR WHEEZING OR SHORTNESS OF BREATH 8.5 g 3   amLODipine (NORVASC) 5 MG tablet TAKE 1 TABLET(5 MG) BY MOUTH DAILY 90 tablet 2   ammonium lactate (AMLACTIN) 12 % cream Apply topically as needed for dry skin. 385 g 6   doxycycline (VIBRAMYCIN) 100 MG capsule Take 1 capsule (100 mg total) by mouth 2 (two) times daily. 20 capsule 0   Lancets (ONETOUCH DELICA PLUS [id number]) MISC USE TO TEST BLOOD SUGAR EVERY DAY 100 each 1   levocetirizine (XYZAL) 5 MG tablet TAKE 1 TABLET(5 MG) BY MOUTH EVERY EVENING  90 tablet 2   lovastatin (MEVACOR) 20 MG tablet Take 1 tablet (20 mg total) by mouth at bedtime. 90 tablet 3   mometasone (NASONEX) 50 MCG/ACT nasal spray Place 2 sprays into the nose daily. 17 g 4   montelukast (SINGULAIR) 10 MG tablet TAKE 1 TABLET(10 MG) BY MOUTH AT BEDTIME 90 tablet 1   olmesartan-hydrochlorothiazide (BENICAR HCT) 40-25 MG tablet Take 1 tablet by mouth daily. 90 tablet 2   omeprazole (PRILOSEC) 40 MG capsule TAKE 1 CAPSULE(40 MG) BY MOUTH DAILY 90 capsule 1   ONETOUCH VERIO test strip USE TO TEST BLOOD SUGAR EVERY DAY 100 strip 3   WIXELA INHUB 250-50 MCG/DOSE AEPB INHALE 1 PUFF INTO THE LUNGS TWICE DAILY (Patient taking differently: Inhale 1 puff into the lungs as needed.) 60 each 1   No current facility-administered medications on file prior to visit.    Allergies  Allergen Reactions   Codeine     Unable to Urinate      Family History  Problem Relation Age of Onset   Hyperlipidemia Mother    Stroke Mother    Heart disease Mother    Hypertension Mother    Diabetes Mother    Colon polyps Mother        TA+   Hyperlipidemia Father    Heart disease Father    Stroke Father    Hypertension Father    Diabetes Father    Colon cancer Father        Dx'd in his 84's    Diabetes Brother    Hypertension Maternal Grandmother    Diabetes Mellitus II Maternal Grandmother    Colon cancer Paternal Uncle    Esophageal cancer Neg Hx    Rectal cancer Neg Hx    Stomach cancer Neg Hx     BP 94/64   Pulse 94   Ht 6' (1.829 m)   Wt (!) 319 lb 9.6 oz (145 kg)   SpO2 97%   BMI 43.35 kg/m    Review of Systems Denies N/HB    Objective:   Physical Exam EXT: trace bilat leg edema   Lab Results  Component Value Date   TSH 1.73 10/26/2020   T3TOTAL 106.6 10/26/2009   A1c=7.2%    Assessment & Plan:  Type 2 DM: uncontrolled.   Patient Instructions  check your blood sugar once a day.  vary the time of day when you check, between before the 3 meals, and at bedtime.  also check if you have symptoms of your blood sugar being too high or too low.  please keep a record of the readings and bring it to your next appointment here (or you can bring the meter itself).  You can write it on any piece of paper.  please call us sooner if your blood sugar goes below 70, or if you have a lot of readings over 200.   I have sent a prescription to your pharmacy, to add bromocriptine.   Please continue the same Trulicity. Please come back for a follow-up appointment in 3 months.

## 2021-01-20 ENCOUNTER — Telehealth: Payer: Self-pay

## 2021-01-20 MED ORDER — TRULICITY 3 MG/0.5ML ~~LOC~~ SOAJ: 3.0000 mg | SUBCUTANEOUS | 3 refills | Status: DC

## 2021-01-20 MED ORDER — REPAGLINIDE 0.5 MG PO TABS: 0.5000 mg | ORAL_TABLET | Freq: Two times a day (BID) | ORAL | 3 refills | Status: DC

## 2021-01-20 NOTE — Telephone Encounter (Signed)
Pt can not afford co payment for Cycloset ($300). Pt was advised to contact insurance to see if there is an alternative for the cycloset medication. Pt requested refill of 3.0 mg Trulicity as pharmacy is backordered for the 4.5 dose.

## 2021-01-20 NOTE — Telephone Encounter (Signed)
Pt notified via MyChart message.

## 2021-01-23 NOTE — Telephone Encounter (Signed)
Please cal Mrs.  [name] she is still having an issue with Truliciy availably. Please call be back on phone [phone].

## 2021-01-31 ENCOUNTER — Other Ambulatory Visit: Payer: Self-pay | Admitting: Family Medicine

## 2021-01-31 ENCOUNTER — Other Ambulatory Visit: Payer: Self-pay | Admitting: Endocrinology

## 2021-01-31 MED ORDER — SEMAGLUTIDE (1 MG/DOSE) 4 MG/3ML ~~LOC~~ SOPN: 1.0000 mg | PEN_INJECTOR | SUBCUTANEOUS | 3 refills | Status: DC

## 2021-02-01 ENCOUNTER — Other Ambulatory Visit: Payer: Self-pay | Admitting: Family Medicine

## 2021-02-14 ENCOUNTER — Ambulatory Visit
Admission: RE | Admit: 2021-02-14 | Discharge: 2021-02-14 | Disposition: A | Discharge status: Home / Self Care | Payer: BC Managed Care – PPO | Source: Ambulatory Visit | Attending: Family Medicine | Admitting: Family Medicine

## 2021-02-14 DIAGNOSIS — Z1231 Encounter for screening mammogram for malignant neoplasm of breast: Secondary | ICD-10-CM

## 2021-03-06 ENCOUNTER — Other Ambulatory Visit: Payer: Self-pay | Admitting: Family Medicine

## 2021-03-06 DIAGNOSIS — J309 Allergic rhinitis, unspecified: Secondary | ICD-10-CM

## 2021-03-10 ENCOUNTER — Ambulatory Visit: Payer: BC Managed Care – PPO | Admitting: Family Medicine

## 2021-03-10 ENCOUNTER — Encounter: Payer: Self-pay | Admitting: Family Medicine

## 2021-03-10 VITALS — BP 118/70 | HR 96 | Resp 16 | Ht 72.0 in | Wt 330.4 lb

## 2021-03-10 DIAGNOSIS — E1169 Type 2 diabetes mellitus with other specified complication: Secondary | ICD-10-CM

## 2021-03-10 DIAGNOSIS — R079 Chest pain, unspecified: Secondary | ICD-10-CM | POA: Diagnosis not present

## 2021-03-10 DIAGNOSIS — I10 Essential (primary) hypertension: Secondary | ICD-10-CM | POA: Diagnosis not present

## 2021-03-10 DIAGNOSIS — L989 Disorder of the skin and subcutaneous tissue, unspecified: Secondary | ICD-10-CM | POA: Diagnosis not present

## 2021-03-10 NOTE — Progress Notes (Addendum)
ACUTE VISIT Chief Complaint  Patient presents with   pain in chest wall    On the right side above her breast and will go under her arm. Happens when she is stressed out or worked up and also when she's been drinking. No chest heaviness or pain.    HPI: Ms.Darilyn N Fern is a 46 y.o. female with history of DM 2, hypertension, GERD, hypothyroidism, and hyperlipidemia here today complaining of intermittent left-sided chest pain that has been going on for "a while." It happens when resting most of the time.  She has not identified alleviating factors. Sometimes radiated to proximal aspect of left arm and under axilla. Negative for breast pain,nipple discharge,or masses.  Chest Pain  This is a recurrent problem. The onset quality is gradual. The pain is at a severity of 10/10. The quality of the pain is described as tightness. The pain radiates to the left shoulder and left arm. Associated symptoms include palpitations. Pertinent negatives include no abdominal pain, back pain, claudication, cough, diaphoresis, dizziness, exertional chest pressure, fever, headaches, hemoptysis, irregular heartbeat, leg pain, lower extremity edema, malaise/fatigue, nausea, near-syncope, numbness, orthopnea, PND, shortness of breath, vomiting or weakness. The pain is aggravated by emotional upset. She has tried acetaminophen for the symptoms. The treatment provided mild relief. Risk factors include smoking/tobacco exposure, obesity and stress.  Her past medical history is significant for diabetes, hyperlipidemia, hypertension and thyroid problem.  Her family medical history is significant for diabetes, heart disease, hyperlipidemia and hypertension.  States that she does not have SOB but rather "labored" breathing during episodes and palpitations.  She is concerned about cardiac etiology. GERD on Omeprazole 40 mg daily.  HTN on Olmesartan-HCTZ 40-25 mg and Amlodipine 5 mg daily. HLD on Lovastatin 20 mg  daily. Lab Results  Component Value Date   CHOL 141 06/01/2020   HDL 62.60 06/01/2020   LDLCALC 67 06/01/2020   TRIG 55.0 06/01/2020   CHOLHDL 2 06/01/2020    She is exercising regularly, stationary bike and has not had above symptoms when doing so.  She is also concerned about RLE skin lesion, "bump.". Noted a months ago, constant, no changes in size. It is not pruritic or tender.  DM II: She follows with endocrinologist.  Review of Systems  Constitutional:  Negative for activity change, appetite change, diaphoresis, fatigue, fever, malaise/fatigue and unexpected weight change.  HENT:  Negative for mouth sores, nosebleeds, sore throat and trouble swallowing.   Eyes:  Negative for redness and visual disturbance.  Respiratory:  Negative for cough, hemoptysis, shortness of breath and wheezing.   Cardiovascular:  Positive for chest pain and palpitations. Negative for orthopnea, claudication, leg swelling, PND and near-syncope.  Gastrointestinal:  Negative for abdominal pain, nausea and vomiting.       Negative for changes in bowel habits.  Genitourinary:  Negative for decreased urine volume and hematuria.  Musculoskeletal:  Negative for back pain.  Skin:  Negative for pallor and rash.  Neurological:  Negative for dizziness, syncope, weakness, numbness and headaches.  Psychiatric/Behavioral:  Negative for confusion. The patient is nervous/anxious.   Rest see pertinent positives and negatives per HPI.  Current Outpatient Medications on File Prior to Visit  Medication Sig Dispense Refill   albuterol (VENTOLIN HFA) 108 (90 Base) MCG/ACT inhaler INHALE 2 PUFFS INTO THE LUNGS EVERY 6 HOURS AS NEEDED FOR WHEEZING OR SHORTNESS OF BREATH 8.5 g 3   amLODipine (NORVASC) 5 MG tablet TAKE 1 TABLET(5 MG) BY MOUTH DAILY 90 tablet 2  ammonium lactate (AMLACTIN) 12 % cream Apply topically as needed for dry skin. 385 g 6   doxycycline (VIBRAMYCIN) 100 MG capsule Take 1 capsule (100 mg total) by mouth  2 (two) times daily. 20 capsule 0   Lancets (ONETOUCH DELICA PLUS [id number]) MISC USE TO TEST BLOOD SUGAR EVERY DAY 100 each 1   levocetirizine (XYZAL) 5 MG tablet TAKE 1 TABLET(5 MG) BY MOUTH EVERY EVENING 90 tablet 2   lovastatin (MEVACOR) 20 MG tablet TAKE 1 TABLET(20 MG) BY MOUTH AT BEDTIME 90 tablet 3   mometasone (NASONEX) 50 MCG/ACT nasal spray Place 2 sprays into the nose daily. 17 g 4   montelukast (SINGULAIR) 10 MG tablet TAKE 1 TABLET(10 MG) BY MOUTH AT BEDTIME 90 tablet 1   olmesartan-hydrochlorothiazide (BENICAR HCT) 40-25 MG tablet Take 1 tablet by mouth daily. 90 tablet 2   omeprazole (PRILOSEC) 40 MG capsule TAKE 1 CAPSULE(40 MG) BY MOUTH DAILY 90 capsule 1   ONETOUCH VERIO test strip USE TO TEST BLOOD SUGAR EVERY DAY 100 strip 3   repaglinide (PRANDIN) 0.5 MG tablet Take 1 tablet (0.5 mg total) by mouth 2 (two) times daily before a meal. 180 tablet 3   Semaglutide, 1 MG/DOSE, 4 MG/3ML SOPN Inject 1 mg as directed once a week. 9 mL 3   WIXELA INHUB 250-50 MCG/DOSE AEPB INHALE 1 PUFF INTO THE LUNGS TWICE DAILY (Patient taking differently: Inhale 1 puff into the lungs as needed.) 60 each 1   No current facility-administered medications on file prior to visit.   Past Medical History:  Diagnosis Date   Allergy    Anemia    Asthma    Diabetes mellitus without complication ([place]) 30/1601   GERD (gastroesophageal reflux disease)    Hypertension    OSA (obstructive sleep apnea)    Sleep apnea    no cpap - told no OSA after told did so unsure if + or not    Allergies  Allergen Reactions   Codeine     Unable to Urinate     Social History   Socioeconomic History   Marital status: Married    Spouse name: Not on file   Number of children: 1   Years of education: Not on file   Highest education level: Master's degree (e.g., MA, MS, MEng, MEd, MSW, MBA)  Occupational History   Occupation: Software engineer  Tobacco Use   Smoking status: Every Day    Packs/day:  0.50    Types: Cigarettes    Last attempt to quit: 11/25/2018    Years since quitting: 2.2   Smokeless tobacco: Never   Tobacco comments:    down to 3 cigarettes per day  Vaping Use   Vaping Use: Never used  Substance and Sexual Activity   Alcohol use: Yes    Comment: socially   Drug use: No   Sexual activity: Not on file  Other Topics Concern   Not on file  Social History Narrative   Not on file   Social Determinants of Health   Financial Resource Strain: Low Risk    Difficulty of Paying Living Expenses: Not very hard  Food Insecurity: Food Insecurity Present   Worried About Running Out of Food in the Last Year: Sometimes true   Ran Out of Food in the Last Year: Never true  Transportation Needs: No Transportation Needs   Lack of Transportation (Medical): No   Lack of Transportation (Non-Medical): No  Physical Activity: Insufficiently Active   Days of  Exercise per Week: 4 days   Minutes of Exercise per Session: 30 min  Stress: Stress Concern Present   Feeling of Stress : To some extent  Social Connections: Unknown   Frequency of Communication with Friends and Family: More than three times a week   Frequency of Social Gatherings with Friends and Family: Once a week   Attends Religious Services: Patient refused   Active Member of Clubs or Organizations: Yes   Attends Archivist Meetings: 1 to 4 times per year   Marital Status: Married   Vitals:   03/10/21 1351  BP: 118/70  Pulse: 96  Resp: 16  SpO2: 97%   Wt Readings from Last 3 Encounters:  03/10/21 (!) 330 lb 6 oz (149.9 kg)  01/18/21 (!) 319 lb 9.6 oz (145 kg)  11/17/20 (!) 325 lb (147.4 kg)   Body mass index is 44.81 kg/m.  Physical Exam Vitals and nursing note reviewed.  Constitutional:      General: She is not in acute distress.    Appearance: She is well-developed.  HENT:     Head: Normocephalic and atraumatic.     Mouth/Throat:     Mouth: Mucous membranes are moist.     Pharynx:  Oropharynx is clear.  Eyes:     Conjunctiva/sclera: Conjunctivae normal.  Cardiovascular:     Rate and Rhythm: Normal rate and regular rhythm.     Pulses:          Dorsalis pedis pulses are 2+ on the right side and 2+ on the left side.     Heart sounds: No murmur heard. Pulmonary:     Effort: Pulmonary effort is normal. No respiratory distress.     Breath sounds: Normal breath sounds.  Chest:    Abdominal:     Palpations: Abdomen is soft. There is no hepatomegaly or mass.     Tenderness: There is no abdominal tenderness.  Lymphadenopathy:     Cervical: No cervical adenopathy.     Upper Body:     Left upper body: No supraclavicular or axillary adenopathy.  Skin:    General: Skin is warm.     Findings: Lesion present. No erythema or rash.     Comments: Right calf with 3 mm raised rounded lesion, mildly hyperpigmented, firm. Denies borders,not tender.  Neurological:     General: No focal deficit present.     Mental Status: She is alert and oriented to person, place, and time.     Cranial Nerves: No cranial nerve deficit.     Gait: Gait normal.  Psychiatric:     Comments: Well groomed, good eye contact.   ASSESSMENT AND PLAN:  Ms.Roneka was seen today for pain in chest wall.  Diagnoses and all orders for this visit: Orders Placed This Encounter  Procedures   Ambulatory referral to Cardiology   EKG 12-Lead   Chest pain, unspecified type We discussed possible etiologies. ? Musculoskeletal pain. CP was not elicited on examination today. Because CV risk factors + associated "labored" breathing and palpitations, I think cardiac work up is warrant. EKG today: NSR, normal axis and intervals. Artefact V1-V2. Unspecific T wave abnormalities. No significant changes when compared with EKG done on 07/13/19. We discussed options, including coronary CT calcium score or cardio referral, cardiology referral placed. For pain management continue Tylenol 500 mg 3-4 times daily as  needed. Clearly instructed about warning signs.  Hypertension, essential, benign BP adequately controlled. Continue Olmesartan-HCTZ 40-25 mg and Amlodipine 5 mg daily. Low  salt diet to continue.  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 Continue monitoring for changes.  Type 2 diabetes mellitus with other specified complication, without long-term current use of insulin [facility]) Following with endocrinologist.  Morbid obesity ([place]) Encouraged to continue regular , low impact exercise and a healthful diet.  Return if symptoms worsen or fail to improve.  Avonte Sensabaugh G. Martinique, MD  [facility]. [place] office.

## 2021-03-10 NOTE — Patient Instructions (Addendum)
A few things to remember from today's visit:  Chest pain, unspecified type - Plan: EKG 12-Lead  If you need refills please call your pharmacy. Do not use My Chart to request refills or for acute issues that need immediate attention.   Chest pain could be musculoskeletal but because you have some cardiovascular risk factors I am sending you to cardiologist. Topical icy hot may help. Try Tylenol 500 mg 3-4 times daily for 7 days. Continue monitoring blood pressure at home.  Please be sure medication list is accurate. If a new problem present, please set up appointment sooner than planned today.

## 2021-03-14 DIAGNOSIS — M25561 Pain in right knee: Secondary | ICD-10-CM | POA: Insufficient documentation

## 2021-03-24 ENCOUNTER — Encounter: Payer: Self-pay | Admitting: Endocrinology

## 2021-03-28 ENCOUNTER — Other Ambulatory Visit: Payer: Self-pay | Admitting: Endocrinology

## 2021-03-28 MED ORDER — SEMAGLUTIDE (1 MG/DOSE) 4 MG/3ML ~~LOC~~ SOPN: 1.0000 mg | PEN_INJECTOR | SUBCUTANEOUS | 3 refills | Status: DC

## 2021-04-04 NOTE — Progress Notes (Signed)
Cardiology Office Note:    Date:  04/06/2021   ID:  Monica [name], DOB November 14, 1975, MRN [id number]  PCP:  Martinique, Betty G, MD   [facility] Prov[facility] Cardiologist:  Lenna Sciara, MD Referring MD: Martinique, Betty G, MD   Chief Complaint/Reason for Referral: Chest pain  ASSESSMENT:    Chest pain, unspecified type  Hypertension, essential, benign  Hyperlipidemia associated with type 2 diabetes mellitus ([place])  Type 2 diabetes mellitus without complication, without long-term current use of insulin ([place])  BMI 40.0-44.9, adult ([place])  Snoring  Tobacco abuse    PLAN:    In order of problems listed above:  1.  We will obtain a coronary CTA and echocardiogram to evaluate further.  If the patient has mild obstructive coronary artery disease, they will require a statin (with goal LDL < 70) and aspirin, if they have high-grade disease we will need to consider optimal medical therapy and if symptoms are refractory to medical therapy, then a cardiac catheterization with possible PCI will be pursued to alleviate symptoms.  If they have high risk disease we will proceed directly to cardiac catheterization.  Follow-up 1 year or earlier if needed.  2.  BP well controlled.  3.  This is being followed by the patient's primary care provider.  Goal LDL is less than 70.  Her last LDL was 67  4.  Start aspirin 81 mg, continue lovastatin, continue Benicar; she cannot tolerate SGLTi due to yeast infections.  5.  We will refer to pharmacy for recommendations.  6.  Will obtain sleep study.  7.  Stressed need for abstinence.             Dispo:  No follow-ups on file.     Medication Adjustments/Labs and Tests Ordered: Current medicines are reviewed at length with the patient today.  Concerns regarding medicines are outlined above.   Tests Ordered: No orders of the defined types were placed in this encounter.   Medication Changes: No orders of the defined types were placed in this  encounter.   History of Present Illness:    FOCUSED CARDIOVASCULAR PROBLEM LIST:   1.  Type 2 diabetes 2.  Hypertension 3.  Hyperlipidemia 4.  Obstructive sleep apnea   The patient is a 46 y.o. female with the indicated medical history here for recommendations regarding chest pain.  The patient saw her primary care provider recently and was complaining of chest pain that typically occurs at rest.  The patient tells me that she has had chest discomfort particularly when she is stressed.  Does not really come on with exertion.  She denies any presyncope or syncope.  She has noted some increasing shortness of breath however.  She does exercise on a regular basis however without issues.  She denies any signs or symptoms of stroke, orthopnea, paroxysmal, dyspnea, or any need for hospitalizations.  She does smoke and has thought about quitting but has no set plans in place for this.  In terms of her diabetes she was trialed on an SGLT2 inhibitor but developed severe yeast infections in response to this.  She sees an endocrinologist who is actively managing her diabetes.    Current Medications: Current Meds  Medication Sig   albuterol (VENTOLIN HFA) 108 (90 Base) MCG/ACT inhaler INHALE 2 PUFFS INTO THE LUNGS EVERY 6 HOURS AS NEEDED FOR WHEEZING OR SHORTNESS OF BREATH   amLODipine (NORVASC) 5 MG tablet TAKE 1 TABLET(5 MG) BY MOUTH DAILY   ammonium lactate (AMLACTIN) 12 %  cream Apply topically as needed for dry skin.   Lancets (ONETOUCH DELICA PLUS [id number]) MISC USE TO TEST BLOOD SUGAR EVERY DAY   levocetirizine (XYZAL) 5 MG tablet TAKE 1 TABLET(5 MG) BY MOUTH EVERY EVENING   lovastatin (MEVACOR) 20 MG tablet TAKE 1 TABLET(20 MG) BY MOUTH AT BEDTIME   mometasone (NASONEX) 50 MCG/ACT nasal spray Place 2 sprays into the nose daily.   montelukast (SINGULAIR) 10 MG tablet TAKE 1 TABLET(10 MG) BY MOUTH AT BEDTIME   olmesartan-hydrochlorothiazide (BENICAR HCT) 40-25 MG tablet Take 1 tablet by mouth  daily.   omeprazole (PRILOSEC) 40 MG capsule TAKE 1 CAPSULE(40 MG) BY MOUTH DAILY   ONETOUCH VERIO test strip USE TO TEST BLOOD SUGAR EVERY DAY   repaglinide (PRANDIN) 0.5 MG tablet Take 1 tablet (0.5 mg total) by mouth 2 (two) times daily before a meal.   Semaglutide, 1 MG/DOSE, 4 MG/3ML SOPN Inject 1 mg as directed once a week.   WIXELA INHUB 250-50 MCG/DOSE AEPB INHALE 1 PUFF INTO THE LUNGS TWICE DAILY     Allergies:    Codeine   Social History:   Social History   Tobacco Use   Smoking status: Every Day    Packs/day: 0.50    Types: Cigarettes    Last attempt to quit: 11/25/2018    Years since quitting: 2.3   Smokeless tobacco: Never   Tobacco comments:    down to 3 cigarettes per day  Vaping Use   Vaping Use: Never used  Substance Use Topics   Alcohol use: Yes    Comment: socially   Drug use: No     Family Hx: Family History  Problem Relation Age of Onset   Hyperlipidemia Mother    Stroke Mother    Heart disease Mother    Hypertension Mother    Diabetes Mother    Colon polyps Mother        TA+   Hyperlipidemia Father    Heart disease Father    Stroke Father    Hypertension Father    Diabetes Father    Colon cancer Father        Dx'd in his 38's    Diabetes Brother    Hypertension Maternal Grandmother    Diabetes Mellitus II Maternal Grandmother    Colon cancer Paternal Uncle    Esophageal cancer Neg Hx    Rectal cancer Neg Hx    Stomach cancer Neg Hx      Review of Systems:   Please see the history of present illness.    All other systems reviewed and are negative.     EKGs/Labs/Other Test Reviewed:    EKG: Sinus rhythm with anterior infarct pattern  Prior CV studies: None available  Imaging studies that I have independently reviewed today: None available  Recent Labs: 10/26/2020: ALT 17; BUN 15; Creatinine, Ser 0.89; Hemoglobin 11.8; Platelets 193.0; Potassium 4.3; Sodium 134; TSH 1.73   Recent Lipid Panel Lab Results  Component Value  Date/Time   CHOL 141 06/01/2020 09:30 AM   TRIG 55.0 06/01/2020 09:30 AM   HDL 62.60 06/01/2020 09:30 AM   LDLCALC 67 06/01/2020 09:30 AM   LDLCALC 110 (H) 11/24/2019 08:35 AM    Risk Assessment/Calculations:          Physical Exam:    VS:  BP 100/70 (BP Location: Left Arm, Patient Position: Sitting, Cuff Size: Normal)    Pulse 86    Ht 6' (1.829 m)    Wt (!) 324 lb (  147 kg)    SpO2 95%    BMI 43.94 kg/m    Wt Readings from Last 3 Encounters:  04/06/21 (!) 324 lb (147 kg)  03/10/21 (!) 330 lb 6 oz (149.9 kg)  01/18/21 (!) 319 lb 9.6 oz (145 kg)    GENERAL:  No apparent distress, AOx3 HEENT:  No carotid bruits, +2 carotid impulses, no scleral icterus CAR: RRR no murmurs, gallops, rubs, or thrills RES:  Clear to auscultation bilaterally ABD:  Soft, nontender, nondistended, positive bowel sounds x 4 VASC:  +2 radial pulses, +2 carotid pulses, palpable pedal pulses NEURO:  CN 2-12 grossly intact; motor and sensory grossly intact PSYCH:  No active depression or anxiety EXT:  No edema, ecchymosis, or cyanosis  Signed, Early Osmond, MD  04/06/2021 10:11 AM    [address], [place], [place]  [address] Phone: [phone]; Fax: [phone]   Note:  This document was prepared using Dragon voice recognition software and may include unintentional dictation errors.

## 2021-04-06 ENCOUNTER — Encounter: Payer: Self-pay | Admitting: Internal Medicine

## 2021-04-06 ENCOUNTER — Other Ambulatory Visit: Payer: Self-pay

## 2021-04-06 ENCOUNTER — Other Ambulatory Visit (HOSPITAL_COMMUNITY): Payer: Self-pay

## 2021-04-06 ENCOUNTER — Ambulatory Visit: Payer: BC Managed Care – PPO | Admitting: Internal Medicine

## 2021-04-06 VITALS — BP 100/70 | HR 86 | Ht 72.0 in | Wt 324.0 lb

## 2021-04-06 DIAGNOSIS — I10 Essential (primary) hypertension: Secondary | ICD-10-CM | POA: Diagnosis not present

## 2021-04-06 DIAGNOSIS — E1169 Type 2 diabetes mellitus with other specified complication: Secondary | ICD-10-CM

## 2021-04-06 DIAGNOSIS — R079 Chest pain, unspecified: Secondary | ICD-10-CM

## 2021-04-06 DIAGNOSIS — E785 Hyperlipidemia, unspecified: Secondary | ICD-10-CM

## 2021-04-06 DIAGNOSIS — Z720 Tobacco use: Secondary | ICD-10-CM

## 2021-04-06 DIAGNOSIS — E119 Type 2 diabetes mellitus without complications: Secondary | ICD-10-CM | POA: Diagnosis not present

## 2021-04-06 DIAGNOSIS — R0683 Snoring: Secondary | ICD-10-CM

## 2021-04-06 DIAGNOSIS — R072 Precordial pain: Secondary | ICD-10-CM

## 2021-04-06 DIAGNOSIS — Z6841 Body Mass Index (BMI) 40.0 and over, adult: Secondary | ICD-10-CM

## 2021-04-06 MED ORDER — IVABRADINE HCL 7.5 MG PO TABS
15.0000 mg | ORAL_TABLET | Freq: Once | ORAL | 0 refills | Status: AC
  Filled 2021-04-06: qty 2, 1d supply, fill #0

## 2021-04-06 NOTE — Patient Instructions (Addendum)
Medication Instructions:  Your physician has recommended you make the following change in your medication:   1.) start aspirin 81 mg - one tablet daily   Lab Work: none   Testing/Procedures: Your physician has requested that you have an echocardiogram. Echocardiography is a painless test that uses sound waves to create images of your heart. It provides your doctor with information about the size and shape of your heart and how well your hearts chambers and valves are working. This procedure takes approximately one hour. There are no restrictions for this procedure.  Your physician has recommended that you have a sleep study. This test records several body functions during sleep, including: brain activity, eye movement, oxygen and carbon dioxide blood levels, heart rate and rhythm, breathing rate and rhythm, the flow of air through your mouth and nose, snoring, body muscle movements, and chest and belly movement.  Cardiac CTA - see instructions below.   Follow-Up: At [facility], you and your health needs are our priority.  As part of our continuing mission to provide you with exceptional heart care, we have created designated Provider Care Teams.  These Care Teams include your primary Cardiologist (physician) and Advanced Practice Providers (APPs -  Physician Assistants and Nurse Practitioners) who all work together to provide you with the care you need, when you need it.   Your next appointment:   12 month(s)  The format for your next appointment:   In Person  Provider:   Early Osmond, MD     Other Instructions You have been referred to our Pharmacy for weight loss management.    Your cardiac CT will be scheduled at  [facility] [place], [place] [address] [phone]   Please arrive at the [facility] main entrance (entrance A) of [facility] 30 minutes prior to test start time. You can use the FREE valet parking offered at  the main entrance (encouraged to control the heart rate for the test) Proceed to the [facility] Radiology Department (first floor) to check-in and test prep.   Please follow these instructions carefully (unless otherwise directed):   On the Night Before the Test: Be sure to Drink plenty of water. Do not consume any caffeinated/decaffeinated beverages or chocolate 12 hours prior to your test. Do not take any antihistamines 12 hours prior to your test. (Xyzal)  On the Day of the Test: Drink plenty of water until 1 hour prior to the test. Do not eat any food 4 hours prior to the test. You may take your regular medications prior to the test.  Take ivabradine (Corlanor) two hours prior to test. HOLD Benicar HCT day of test.  FEMALES- please wear underwire-free bra if available, avoid dresses & tight clothing       After the Test: Drink plenty of water. After receiving IV contrast, you may experience a mild flushed feeling. This is normal. On occasion, you may experience a mild rash up to 24 hours after the test. This is not dangerous. If this occurs, you can take Benadryl 25 mg and increase your fluid intake. If you experience trouble breathing, this can be serious. If it is severe call 911 IMMEDIATELY. If it is mild, please call our office. If you take any of these medications: Glipizide/Metformin, Avandament, Glucavance, please do not take 48 hours after completing test unless otherwise instructed.  We will call to schedule your test 2-4 weeks out understanding that some insurance companies will need an  authorization prior to the service being performed.   For non-scheduling related questions, please contact the cardiac imaging nurse navigator should you have any questions/concerns: Marchia Bond, Cardiac Imaging Nurse Navigator Gordy Clement, Cardiac Imaging Nurse Navigator [facility] Direct Office Dial: [phone]   For scheduling needs, including  cancellations and rescheduling, please call [place], [phone].

## 2021-04-06 NOTE — Addendum Note (Signed)
Addended by: Rodman Key on: 04/06/2021 12:16 PM   Modules accepted: Orders

## 2021-04-10 ENCOUNTER — Other Ambulatory Visit: Payer: Self-pay | Admitting: Family Medicine

## 2021-04-10 DIAGNOSIS — I10 Essential (primary) hypertension: Secondary | ICD-10-CM

## 2021-04-18 ENCOUNTER — Telehealth (HOSPITAL_COMMUNITY): Payer: Self-pay | Admitting: Emergency Medicine

## 2021-04-18 DIAGNOSIS — R079 Chest pain, unspecified: Secondary | ICD-10-CM

## 2021-04-18 NOTE — Progress Notes (Signed)
? ?Subjective:  ? ? Patient ID: Monica [name], female    DOB: 03/30/1975, 46 y.o.   MRN: [id number] ? ?HPI ?Pt returns for f/u of diabetes mellitus:  ?DM type: 2 ?Dx'ed: 2018 ?Complications: none.  ?Therapy: Ozempic and repaglinide.  ?GDM: 1999 ?DKA: never ?Severe hypoglycemia: never ?Pancreatitis: never ?Pancreatic imaging: normal on 2003 CT.   ?SDOH: none ?Other: she took insulin 2020-2021; Victoza dosage was limited by nausea.  She did not tolerate Jardiance (vaginitis) or metformin (abd pain); edema limits rx options.   ?Interval history: pt says cbg varies from 120-150.  pt states she feels well in general.  She takes meds as rx'ed.   ?Pt says she was rx'ed for hyperthyroidism in approx 2006.  She took medication x a few years.  It was stopped, due to euthyroidism.  ?Past Medical History:  ?Diagnosis Date  ? Allergy   ? Anemia   ? Asthma   ? Diabetes mellitus without complication ([place]) 03/6376  ? GERD (gastroesophageal reflux disease)   ? Hypertension   ? OSA (obstructive sleep apnea)   ? Sleep apnea   ? no cpap - told no OSA after told did so unsure if + or not   ? ? ?Past Surgical History:  ?Procedure Laterality Date  ? ABDOMINAL HYSTERECTOMY  02/19/2006  ? BICEPS TENDON REPAIR Bilateral   ? FOOT SURGERY    ? Left  ? KNEE ARTHROSCOPY Bilateral   ? ROTATOR CUFF REPAIR Bilateral   ? ? ?Social History  ? ?Socioeconomic History  ? Marital status: Married  ?  Spouse name: Not on file  ? Number of children: 1  ? Years of education: Not on file  ? Highest education level: Master's degree (e.g., MA, MS, MEng, MEd, MSW, MBA)  ?Occupational History  ? Occupation: Software engineer  ?Tobacco Use  ? Smoking status: Every Day  ?  Packs/day: 0.50  ?  Types: Cigarettes  ?  Last attempt to quit: 11/25/2018  ?  Years since quitting: 2.4  ? Smokeless tobacco: Never  ? Tobacco comments:  ?  down to 3 cigarettes per day  ?Vaping Use  ? Vaping Use: Never used  ?Substance and Sexual Activity  ? Alcohol use: Yes  ?   Comment: socially  ? Drug use: No  ? Sexual activity: Not on file  ?Other Topics Concern  ? Not on file  ?Social History Narrative  ? Not on file  ? ?Social Determinants of Health  ? ?Financial Resource Strain: Low Risk   ? Difficulty of Paying Living Expenses: Not very hard  ?Food Insecurity: Food Insecurity Present  ? Worried About Charity fundraiser in the Last Year: Sometimes true  ? Ran Out of Food in the Last Year: Never true  ?Transportation Needs: No Transportation Needs  ? Lack of Transportation (Medical): No  ? Lack of Transportation (Non-Medical): No  ?Physical Activity: Insufficiently Active  ? Days of Exercise per Week: 4 days  ? Minutes of Exercise per Session: 30 min  ?Stress: Stress Concern Present  ? Feeling of Stress : To some extent  ?Social Connections: Unknown  ? Frequency of Communication with Friends and Family: More than three times a week  ? Frequency of Social Gatherings with Friends and Family: Once a week  ? Attends Religious Services: Patient refused  ? Active Member of Clubs or Organizations: Yes  ? Attends Archivist Meetings: 1 to 4 times per year  ? Marital Status: Married  ?  Intimate Partner Violence: Not on file  ? ? ?Current Outpatient Medications on File Prior to Visit  ?Medication Sig Dispense Refill  ? albuterol (VENTOLIN HFA) 108 (90 Base) MCG/ACT inhaler INHALE 2 PUFFS INTO THE LUNGS EVERY 6 HOURS AS NEEDED FOR WHEEZING OR SHORTNESS OF BREATH 8.5 g 3  ? amLODipine (NORVASC) 5 MG tablet TAKE 1 TABLET(5 MG) BY MOUTH DAILY 90 tablet 2  ? ammonium lactate (AMLACTIN) 12 % cream Apply topically as needed for dry skin. 385 g 6  ? Lancets (ONETOUCH DELICA PLUS [id number]) MISC USE TO TEST BLOOD SUGAR EVERY DAY 100 each 1  ? levocetirizine (XYZAL) 5 MG tablet TAKE 1 TABLET(5 MG) BY MOUTH EVERY EVENING 90 tablet 2  ? lovastatin (MEVACOR) 20 MG tablet TAKE 1 TABLET(20 MG) BY MOUTH AT BEDTIME 90 tablet 3  ? mometasone (NASONEX) 50 MCG/ACT nasal spray Place 2 sprays into the nose  daily. 17 g 4  ? montelukast (SINGULAIR) 10 MG tablet TAKE 1 TABLET(10 MG) BY MOUTH AT BEDTIME 90 tablet 1  ? olmesartan-hydrochlorothiazide (BENICAR HCT) 40-25 MG tablet TAKE 1 TABLET BY MOUTH DAILY 90 tablet 2  ? omeprazole (PRILOSEC) 40 MG capsule TAKE 1 CAPSULE(40 MG) BY MOUTH DAILY 90 capsule 1  ? ONETOUCH VERIO test strip USE TO TEST BLOOD SUGAR EVERY DAY 100 strip 3  ? repaglinide (PRANDIN) 0.5 MG tablet Take 1 tablet (0.5 mg total) by mouth 2 (two) times daily before a meal. 180 tablet 3  ? WIXELA INHUB 250-50 MCG/DOSE AEPB INHALE 1 PUFF INTO THE LUNGS TWICE DAILY 60 each 1  ? ?No current facility-administered medications on file prior to visit.  ? ? ?Allergies  ?Allergen Reactions  ? Codeine   ?  Unable to Urinate ?  ? ? ?Family History  ?Problem Relation Age of Onset  ? Hyperlipidemia Mother   ? Stroke Mother   ? Heart disease Mother   ? Hypertension Mother   ? Diabetes Mother   ? Colon polyps Mother   ?     TA+  ? Hyperlipidemia Father   ? Heart disease Father   ? Stroke Father   ? Hypertension Father   ? Diabetes Father   ? Colon cancer Father   ?     Dx'd in his 48's   ? Diabetes Brother   ? Hypertension Maternal Grandmother   ? Diabetes Mellitus II Maternal Grandmother   ? Colon cancer Paternal Uncle   ? Esophageal cancer Neg Hx   ? Rectal cancer Neg Hx   ? Stomach cancer Neg Hx   ? ? ?BP 114/70   Pulse 89   Ht 6' (1.829 m)   Wt (!) 320 lb 12.8 oz (145.5 kg)   SpO2 98%   BMI 43.51 kg/m?  ? ? ?Review of Systems ?Denies N/V/HB/bloating. ?   ?Objective:  ? Physical Exam ? ? ? ?A1c=6.9% ?   ?Assessment & Plan:  ?Type 2 DM: She would benefit from increased rx, if it can be done with a regimen that avoids or minimizes hypoglycemia. ?Hyperthyroidism, due for recheck. ? ?Patient Instructions  ?check your blood sugar once a day.  vary the time of day when you check, between before the 3 meals, and at bedtime.  also check if you have symptoms of your blood sugar being too high or too low.  please keep a  record of the readings and bring it to your next appointment here (or you can bring the meter itself).  You can write  it on any piece of paper.  please call us sooner if your blood sugar goes below 70, or if you have a lot of readings over 200.   ?I have sent a prescription to your pharmacy, to double the [place].   ?Please continue the same repaglinide.   ?Blood tests are requested for you today.  We'll let you know about the results.   ?Please come back for a follow-up appointment in 6 months.   ? ? ?

## 2021-04-18 NOTE — Telephone Encounter (Signed)
Reaching out to patient to offer assistance regarding upcoming cardiac imaging study; pt verbalizes understanding of appt date/time, parking situation and where to check in, pre-test NPO status and medications ordered, and verified current allergies; name and call back number provided for further questions should they arise Marchia Bond RN Navigator Cardiac Imaging [facility] [phone] office [phone] cell  Difficult IV start 15mg  ivabradine 2 hr prior to scan Holding benicar HCT, stimulants (allergy meds, inhaler) Arrival 1130  Reminded to get BMP (order placed) states she has appt tomorrow with ellison MD or will come to [facility] for labs

## 2021-04-19 ENCOUNTER — Ambulatory Visit: Payer: BC Managed Care – PPO | Admitting: Endocrinology

## 2021-04-19 ENCOUNTER — Other Ambulatory Visit: Payer: BC Managed Care – PPO

## 2021-04-19 ENCOUNTER — Other Ambulatory Visit: Payer: Self-pay

## 2021-04-19 VITALS — BP 114/70 | HR 89 | Ht 72.0 in | Wt 320.8 lb

## 2021-04-19 DIAGNOSIS — E1169 Type 2 diabetes mellitus with other specified complication: Secondary | ICD-10-CM | POA: Diagnosis not present

## 2021-04-19 LAB — POCT GLYCOSYLATED HEMOGLOBIN (HGB A1C): Hemoglobin A1C: 6.9 % — AB (ref 4.0–5.6)

## 2021-04-19 LAB — BASIC METABOLIC PANEL
BUN: 16 mg/dL (ref 6–23)
CO2: 28 mEq/L (ref 19–32)
Calcium: 9.5 mg/dL (ref 8.4–10.5)
Chloride: 101 mEq/L (ref 96–112)
Creatinine, Ser: 0.91 mg/dL (ref 0.40–1.20)
GFR: 74.84 mL/min (ref >60.00)
Glucose, Bld: 137 mg/dL — ABNORMAL HIGH (ref 70–99)
Potassium: 4.2 mEq/L (ref 3.5–5.1)
Sodium: 137 mEq/L (ref 135–145)

## 2021-04-19 LAB — TSH: TSH: 2.75 u[IU]/mL (ref 0.35–5.50)

## 2021-04-19 LAB — T4, FREE: Free T4: 0.98 ng/dL (ref 0.60–1.60)

## 2021-04-19 MED ORDER — OZEMPIC (2 MG/DOSE) 8 MG/3ML ~~LOC~~ SOPN: 2.0000 mg | PEN_INJECTOR | SUBCUTANEOUS | 3 refills | Status: DC

## 2021-04-19 NOTE — Patient Instructions (Addendum)
check your blood sugar once a day.  vary the time of day when you check, between before the 3 meals, and at bedtime.  also check if you have symptoms of your blood sugar being too high or too low.  please keep a record of the readings and bring it to your next appointment here (or you can bring the meter itself).  You can write it on any piece of paper.  please call us sooner if your blood sugar goes below 70, or if you have a lot of readings over 200.   ?I have sent a prescription to your pharmacy, to double the [place].   ?Please continue the same repaglinide.   ?Blood tests are requested for you today.  We'll let you know about the results.   ?Please come back for a follow-up appointment in 6 months.   ?

## 2021-04-20 ENCOUNTER — Ambulatory Visit (HOSPITAL_COMMUNITY)
Admission: RE | Admit: 2021-04-20 | Discharge: 2021-04-20 | Disposition: A | Discharge status: Home / Self Care | Payer: BC Managed Care – PPO | Source: Ambulatory Visit | Attending: Internal Medicine | Admitting: Internal Medicine

## 2021-04-20 ENCOUNTER — Ambulatory Visit (HOSPITAL_BASED_OUTPATIENT_CLINIC_OR_DEPARTMENT_OTHER): Payer: BC Managed Care – PPO

## 2021-04-20 DIAGNOSIS — Z6841 Body Mass Index (BMI) 40.0 and over, adult: Secondary | ICD-10-CM

## 2021-04-20 DIAGNOSIS — R079 Chest pain, unspecified: Secondary | ICD-10-CM | POA: Insufficient documentation

## 2021-04-20 DIAGNOSIS — R072 Precordial pain: Secondary | ICD-10-CM | POA: Diagnosis not present

## 2021-04-20 DIAGNOSIS — E119 Type 2 diabetes mellitus without complications: Secondary | ICD-10-CM | POA: Diagnosis present

## 2021-04-20 DIAGNOSIS — Z720 Tobacco use: Secondary | ICD-10-CM | POA: Diagnosis present

## 2021-04-20 DIAGNOSIS — R0683 Snoring: Secondary | ICD-10-CM | POA: Insufficient documentation

## 2021-04-20 DIAGNOSIS — E785 Hyperlipidemia, unspecified: Secondary | ICD-10-CM | POA: Diagnosis present

## 2021-04-20 DIAGNOSIS — E1169 Type 2 diabetes mellitus with other specified complication: Secondary | ICD-10-CM

## 2021-04-20 DIAGNOSIS — I10 Essential (primary) hypertension: Secondary | ICD-10-CM | POA: Diagnosis present

## 2021-04-20 LAB — ECHOCARDIOGRAM COMPLETE
Area-P 1/2: 3.72 cm2
S' Lateral: 3.6 cm

## 2021-04-20 MED ORDER — METOPROLOL TARTRATE 5 MG/5ML IV SOLN
INTRAVENOUS | Status: AC
  Filled 2021-04-20: qty 15

## 2021-04-20 MED ORDER — NITROGLYCERIN 0.4 MG SL SUBL
SUBLINGUAL_TABLET | SUBLINGUAL | Status: AC
  Filled 2021-04-20: qty 2

## 2021-04-20 MED ORDER — IOHEXOL 350 MG/ML SOLN
95.0000 mL | Freq: Once | INTRAVENOUS | Status: AC | PRN
  Administered 2021-04-20: 95 mL via INTRAVENOUS

## 2021-04-20 MED ORDER — NITROGLYCERIN 0.4 MG SL SUBL
0.8000 mg | SUBLINGUAL_TABLET | Freq: Once | SUBLINGUAL | Status: AC
  Administered 2021-04-20: 0.8 mg via SUBLINGUAL

## 2021-04-20 MED ORDER — METOPROLOL TARTRATE 5 MG/5ML IV SOLN
5.0000 mg | INTRAVENOUS | Status: DC | PRN
  Administered 2021-04-20: 5 mg via INTRAVENOUS

## 2021-05-02 ENCOUNTER — Other Ambulatory Visit: Payer: Self-pay | Admitting: Family Medicine

## 2021-05-04 ENCOUNTER — Other Ambulatory Visit: Payer: Self-pay | Admitting: Family Medicine

## 2021-05-04 ENCOUNTER — Telehealth: Payer: Self-pay | Admitting: *Deleted

## 2021-05-04 NOTE — Telephone Encounter (Signed)
NO PA required for split night study  Ref# [id number] ?

## 2021-05-15 ENCOUNTER — Other Ambulatory Visit: Payer: Self-pay

## 2021-05-15 ENCOUNTER — Ambulatory Visit: Payer: BC Managed Care – PPO | Admitting: Pharmacist

## 2021-05-15 DIAGNOSIS — Z716 Tobacco abuse counseling: Secondary | ICD-10-CM | POA: Diagnosis not present

## 2021-05-15 NOTE — Patient Instructions (Addendum)
Argie Ramming- personal trainer [phone] ?[employer] Aud[employer] scientist - [email or web address] ?[place] ([place]) [phone] ? ?No processed food ?High fiber ?Plenty of vegetables ?Small meals ? ? ?Call me at [phone] with any questions ?

## 2021-05-15 NOTE — Telephone Encounter (Addendum)
Patient is scheduled for lab study on 07/10/21. ?Patient understands her sleep study will be done at [facility] sleep lab. ?Patient understands she will receive a sleep packet in a week or so. ?Patient understands to call if she does not receive the sleep packet in a timely manner. ?Patient agrees with treatment and thanked me for call. ?

## 2021-05-15 NOTE — Progress Notes (Signed)
Patient ID: Monica [name]                 DOB: 09-23-1975                    MRN: [id number] ? ? ? ? ?HPI: ?Monica [name] is a 46 y.o. female patient referred to pharmacy clinic by Dr. Ali Lowe to initiate weight loss therapy with GLP1-RA. PMH is significant for obesity complicated by chronic medical conditions including DM and tobacco abuse. Most recent BMI 43.2. ? ?Patient presents to CVRR clinic today. She works at [employer]. Rides the stationary bike 30-18mn 4 days a week. Use to be in the [employer] Has been bigger, only way she lost the weight was by working out everyday. She is already on GLP-1. She was on Trulicity but wasn't able to get so she was switched to Ozempic. Just recently increased to '2mg'$ .  States she never really ate all that much and now its even hard to eat. Yesterday she only ate dinner. Knows she has to start eating more frequently.  ? ?Current weight management medications: Ozempic '2mg'$  weekly ? ?Previously tried meds: Trulicity ? ?Diet:  ?-Breakfast: coffee w/ 1 TBS of sugar ?-Lunch: none ?-Dinner: steak and potato last night ?-Snacks: ?-Drinks: no soda, no juice ? ?Exercise: bike 30-475m 4 days a week ? ?Family History:  ?Family History  ?Problem Relation Age of Onset  ? Hyperlipidemia Mother   ? Stroke Mother   ? Heart disease Mother   ? Hypertension Mother   ? Diabetes Mother   ? Colon polyps Mother   ?     TA+  ? Hyperlipidemia Father   ? Heart disease Father   ? Stroke Father   ? Hypertension Father   ? Diabetes Father   ? Colon cancer Father   ?     Dx'd in his 5059's ? Diabetes Brother   ? Hypertension Maternal Grandmother   ? Diabetes Mellitus II Maternal Grandmother   ? Colon cancer Paternal Uncle   ? Esophageal cancer Neg Hx   ? Rectal cancer Neg Hx   ? Stomach cancer Neg Hx   ? ? ? ?Social History:  ?Social History  ? ?Socioeconomic History  ? Marital status: Married  ?  Spouse name: Not on file  ? Number of children: 1  ? Years of education: Not on file  ? Highest education level:  Master's degree (e.g., MA, MS, MEng, MEd, MSW, MBA)  ?Occupational History  ? Occupation: TeSoftware engineer?Tobacco Use  ? Smoking status: Every Day  ?  Packs/day: 0.50  ?  Types: Cigarettes  ?  Last attempt to quit: 11/25/2018  ?  Years since quitting: 2.4  ? Smokeless tobacco: Never  ? Tobacco comments:  ?  down to 3 cigarettes per day  ?Vaping Use  ? Vaping Use: Never used  ?Substance and Sexual Activity  ? Alcohol use: Yes  ?  Comment: socially  ? Drug use: No  ? Sexual activity: Not on file  ?Other Topics Concern  ? Not on file  ?Social History Narrative  ? Not on file  ? ?Social Determinants of Health  ? ?Financial Resource Strain: Low Risk   ? Difficulty of Paying Living Expenses: Not very hard  ?Food Insecurity: Food Insecurity Present  ? Worried About RuCharity fundraisern the Last Year: Sometimes true  ? Ran Out of Food in the Last Year: Never true  ?  Transportation Needs: No Transportation Needs  ? Lack of Transportation (Medical): No  ? Lack of Transportation (Non-Medical): No  ?Physical Activity: Insufficiently Active  ? Days of Exercise per Week: 4 days  ? Minutes of Exercise per Session: 30 min  ?Stress: Stress Concern Present  ? Feeling of Stress : To some extent  ?Social Connections: Unknown  ? Frequency of Communication with Friends and Family: More than three times a week  ? Frequency of Social Gatherings with Friends and Family: Once a week  ? Attends Religious Services: Patient refused  ? Active Member of Clubs or Organizations: Yes  ? Attends Archivist Meetings: 1 to 4 times per year  ? Marital Status: Married  ?Intimate Partner Violence: Not on file  ? ? ? ?Labs: ?Lab Results  ?Component Value Date  ? HGBA1C 6.9 (A) 04/19/2021  ? ? ?Wt Readings from Last 1 Encounters:  ?04/19/21 (!) 320 lb 12.8 oz (145.5 kg)  ? ? ?BP Readings from Last 1 Encounters:  ?04/20/21 118/70  ? ?Pulse Readings from Last 1 Encounters:  ?04/20/21 70  ? ? ?   ?Component Value Date/Time  ? CHOL 141  06/01/2020 0930  ? TRIG 55.0 06/01/2020 0930  ? HDL 62.60 06/01/2020 0930  ? CHOLHDL 2 06/01/2020 0930  ? VLDL 11.0 06/01/2020 0930  ? LDLCALC 67 06/01/2020 0930  ? [place] 110 (H) 11/24/2019 0835  ? ? ?Past Medical History:  ?Diagnosis Date  ? Allergy   ? Anemia   ? Asthma   ? Diabetes mellitus without complication ([place]) 54/0086  ? GERD (gastroesophageal reflux disease)   ? Hypertension   ? OSA (obstructive sleep apnea)   ? Sleep apnea   ? no cpap - told no OSA after told did so unsure if + or not   ? ? ?Current Outpatient Medications on File Prior to Visit  ?Medication Sig Dispense Refill  ? albuterol (VENTOLIN HFA) 108 (90 Base) MCG/ACT inhaler INHALE 2 PUFFS INTO THE LUNGS EVERY 6 HOURS AS NEEDED FOR WHEEZING OR SHORTNESS OF BREATH 8.5 g 3  ? amLODipine (NORVASC) 5 MG tablet TAKE 1 TABLET(5 MG) BY MOUTH DAILY 90 tablet 2  ? ammonium lactate (AMLACTIN) 12 % cream Apply topically as needed for dry skin. 385 g 6  ? Lancets (ONETOUCH DELICA PLUS [id number]) MISC USE TO TEST BLOOD SUGAR EVERYDAY 100 each 1  ? levocetirizine (XYZAL) 5 MG tablet TAKE 1 TABLET(5 MG) BY MOUTH EVERY EVENING 90 tablet 2  ? lovastatin (MEVACOR) 20 MG tablet TAKE 1 TABLET(20 MG) BY MOUTH AT BEDTIME 90 tablet 3  ? mometasone (NASONEX) 50 MCG/ACT nasal spray Place 2 sprays into the nose daily. 17 g 4  ? montelukast (SINGULAIR) 10 MG tablet TAKE 1 TABLET(10 MG) BY MOUTH AT BEDTIME 90 tablet 1  ? olmesartan-hydrochlorothiazide (BENICAR HCT) 40-25 MG tablet TAKE 1 TABLET BY MOUTH DAILY 90 tablet 2  ? omeprazole (PRILOSEC) 40 MG capsule TAKE 1 CAPSULE(40 MG) BY MOUTH DAILY 90 capsule 1  ? ONETOUCH VERIO test strip USE TO TEST BLOOD SUGAR EVERY DAY 100 strip 3  ? repaglinide (PRANDIN) 0.5 MG tablet Take 1 tablet (0.5 mg total) by mouth 2 (two) times daily before a meal. 180 tablet 3  ? Semaglutide, 2 MG/DOSE, (OZEMPIC, 2 MG/DOSE,) 8 MG/3ML SOPN Inject 2 mg into the skin once a week. 9 mL 3  ? WIXELA INHUB 250-50 MCG/DOSE AEPB INHALE 1 PUFF INTO THE  LUNGS TWICE DAILY 60 each 1  ? ?No  current facility-administered medications on file prior to visit.  ? ? ?Allergies  ?Allergen Reactions  ? Codeine   ?  Unable to Urinate ?  ? ? ? ?Assessment/Plan: ? ?1. Weight loss - Patient struggling to loose weight. She has lost weight on her own, but is now at a standstill. We had a good discussion about what we do and do not know about weight loss/gain. We talked about what we do and do not know about the microbiome and obesity. Encourage no processed foods andhigh fiber foods (vegetables, whole grains). Encouraged fermented foods including miso, kimchi, Kefir and plain yogurt. We also discussed eating higher protein (protein shakes ok if its the only way to get the protein in but be careful with add in (sugar).  ?I suggested that she add in strength training. Patient really enjoys weight lyfting. She does have some injuries. I gave her the name of a trainer and a crossfit gym she could look into.  ?Ozempic management per Dr. Loanne Drilling. ? ?2. Tobacco Abuse-  Patient is not interested in using any medications to quite. She know she needs to quite but is struggling. States it is a habit. She does not have urges to smoke if she is around people who do not smoke or is traveling and cannot smoke. Does smoke in the car on the way to work. We talked about replacing one bad habit with a good one, like chewing gum.  ? ?Follow up with patient via telephone in 1 month. ? ?Thank you, ? ?Ramond Dial, Pharm.D, BCPS, CPP ?[id number], [place], [place] [address]  ?Phone: [phone]; Fax: [phone]  ? ? ? ? ? ? ? ?

## 2021-05-27 ENCOUNTER — Other Ambulatory Visit: Payer: Self-pay | Admitting: Family Medicine

## 2021-05-27 DIAGNOSIS — J309 Allergic rhinitis, unspecified: Secondary | ICD-10-CM

## 2021-06-13 ENCOUNTER — Encounter (HOSPITAL_BASED_OUTPATIENT_CLINIC_OR_DEPARTMENT_OTHER): Payer: BC Managed Care – PPO | Admitting: Cardiology

## 2021-06-16 ENCOUNTER — Telehealth: Payer: Self-pay | Admitting: Pharmacist

## 2021-06-16 NOTE — Telephone Encounter (Signed)
Called patient to follow up on how she is going with weight loss and tobacco cessation.  ?At last visit we discussed no processed foods andhigh fiber foods (vegetables, whole grains). Encouraged fermented foods including miso, kimchi, Kefir and plain yogurt. We also discussed eating higher protein (protein shakes ok if its the only way to get the protein in but be careful with add in (sugar). Also discussed adding in strength training. ?Patient was actively working to quite smoking. ?Called pt to see how she was doing with these. LVM for pt to call back. ?

## 2021-06-24 LAB — HM DIABETES EYE EXAM

## 2021-06-26 ENCOUNTER — Encounter: Payer: Self-pay | Admitting: Family Medicine

## 2021-07-05 ENCOUNTER — Other Ambulatory Visit: Payer: Self-pay | Admitting: Family Medicine

## 2021-07-05 ENCOUNTER — Encounter: Payer: Self-pay | Admitting: Pharmacist

## 2021-07-05 DIAGNOSIS — J309 Allergic rhinitis, unspecified: Secondary | ICD-10-CM

## 2021-07-05 DIAGNOSIS — J4520 Mild intermittent asthma, uncomplicated: Secondary | ICD-10-CM

## 2021-07-10 ENCOUNTER — Ambulatory Visit (HOSPITAL_BASED_OUTPATIENT_CLINIC_OR_DEPARTMENT_OTHER): Payer: BC Managed Care – PPO | Attending: Internal Medicine | Admitting: Cardiology

## 2021-08-09 ENCOUNTER — Ambulatory Visit: Payer: BC Managed Care – PPO | Admitting: Podiatry

## 2021-08-09 ENCOUNTER — Ambulatory Visit (INDEPENDENT_AMBULATORY_CARE_PROVIDER_SITE_OTHER): Payer: BC Managed Care – PPO

## 2021-08-09 DIAGNOSIS — M7741 Metatarsalgia, right foot: Secondary | ICD-10-CM | POA: Diagnosis not present

## 2021-08-09 DIAGNOSIS — M779 Enthesopathy, unspecified: Secondary | ICD-10-CM

## 2021-08-09 DIAGNOSIS — Q666 Other congenital valgus deformities of feet: Secondary | ICD-10-CM

## 2021-08-10 MED ORDER — METHYLPREDNISOLONE 4 MG PO TBPK: ORAL_TABLET | ORAL | 0 refills | Status: DC

## 2021-08-10 MED ORDER — MELOXICAM 15 MG PO TABS: 15.0000 mg | ORAL_TABLET | Freq: Every day | ORAL | 0 refills | Status: DC

## 2021-08-14 ENCOUNTER — Telehealth: Payer: Self-pay | Admitting: *Deleted

## 2021-08-14 NOTE — Progress Notes (Signed)
HPI: Monica [name] is a 46 y.o. female, who is here today for her routine physical.  Last CPE: 06/01/20  She has not been exercising regularly due to right foot pain, she is following with podiatrist and currently she is wearing a short surgical boot. She is trying to be consistent with following a healthful diet.  When she is in town she cooks at home but when she travels, which she does frequently due to her job, she eat out.  Chronic medical problems: DM II,HTN,HLD,asthma, and GERD among some.  Immunization History  Administered Date(s) Administered   Influenza,inj,Quad PF,6+ Mos 12/12/2016, 11/25/2017, 12/26/2018, 11/24/2019, 10/26/2020   Moderna SARS-COV2 Booster Vaccination 12/23/2019, 09/13/2020   Moderna Sars-Covid-2 Vaccination 04/30/2019, 06/02/2019   PPD Test 03/11/2016, 10/15/2016   Pneumococcal Polysaccharide-23 08/13/2016   Tdap 04/24/2016   Health Maintenance  Topic Date Due   COVID-19 Vaccine (3 - Moderna series) 11/08/2020   HIV Screening  06/06/2023 (Originally 04/02/1990)   INFLUENZA VACCINE  09/19/2021   FOOT EXAM  10/17/2021   HEMOGLOBIN A1C  10/20/2021   OPHTHALMOLOGY EXAM  06/25/2022   COLONOSCOPY (Pts 45-43yrs Insurance coverage will need to be confirmed)  05/31/2024   TETANUS/TDAP  04/25/2026   Hepatitis C Screening  Completed   HPV VACCINES  Aged Out   PAP SMEAR-Modifier  Discontinued   Hyperlipidemia:She is on Lovastatin 20 mg daily. Lab Results  Component Value Date   CHOL 141 06/01/2020   HDL 62.60 06/01/2020   LDLCALC 67 06/01/2020   TRIG 55.0 06/01/2020   CHOLHDL 2 06/01/2020   Allergy rhinitis: Currently she is on Nasonex nasal spray and Singulair 10 mg daily. Rhinorrhea and nasal congestion, "cannot breath" through her nose. Negative for fever.  Hypertension: She has had SBP < 100 occasionally. States that her endocrinologist had some concerns about mildly low BP.  DM II: She follows with endocrinologist.  Review of Systems   Constitutional:  Negative for appetite change and fever.  HENT:  Positive for congestion, postnasal drip and rhinorrhea. Negative for hearing loss, mouth sores, sore throat, trouble swallowing and voice change.   Eyes:  Negative for redness and visual disturbance.  Respiratory:  Negative for cough, shortness of breath and wheezing.   Cardiovascular:  Negative for chest pain and leg swelling.  Gastrointestinal:  Negative for abdominal pain, nausea and vomiting.       No changes in bowel habits.  Endocrine: Negative for cold intolerance, heat intolerance, polydipsia, polyphagia and polyuria.  Genitourinary:  Negative for decreased urine volume, dysuria, hematuria, vaginal bleeding and vaginal discharge.  Musculoskeletal:  Negative for gait problem and myalgias.  Skin:  Negative for color change and rash.  Allergic/Immunologic: Positive for environmental allergies.  Neurological:  Negative for syncope, weakness and headaches.  Hematological:  Negative for adenopathy. Does not bruise/bleed easily.  Psychiatric/Behavioral:  Negative for confusion. The patient is not nervous/anxious.   All other systems reviewed and are negative.  Current Outpatient Medications on File Prior to Visit  Medication Sig Dispense Refill   albuterol (VENTOLIN HFA) 108 (90 Base) MCG/ACT inhaler INHALE 2 PUFFS INTO THE LUNGS EVERY 6 HOURS AS NEEDED FOR WHEEZING OR SHORTNESS OF BREATH 8.5 g 3   ammonium lactate (AMLACTIN) 12 % cream Apply topically as needed for dry skin. 385 g 6   Lancets (ONETOUCH DELICA PLUS LANCET33G) MISC USE TO TEST BLOOD SUGAR EVERYDAY 100 each 1   levocetirizine (XYZAL) 5 MG tablet TAKE 1 TABLET(5 MG) BY MOUTH EVERY EVENING 90 tablet  2   lovastatin (MEVACOR) 20 MG tablet TAKE 1 TABLET(20 MG) BY MOUTH AT BEDTIME 90 tablet 3   meloxicam (MOBIC) 15 MG tablet Take 1 tablet (15 mg total) by mouth daily. 30 tablet 0   mometasone (NASONEX) 50 MCG/ACT nasal spray SHAKE LIQUID AND USE 2 SPRAYS IN EACH  NOSTRIL DAILY 17 g 4   montelukast (SINGULAIR) 10 MG tablet TAKE 1 TABLET(10 MG) BY MOUTH AT BEDTIME 90 tablet 1   olmesartan-hydrochlorothiazide (BENICAR HCT) 40-25 MG tablet TAKE 1 TABLET BY MOUTH DAILY 90 tablet 2   omeprazole (PRILOSEC) 40 MG capsule TAKE 1 CAPSULE(40 MG) BY MOUTH DAILY 90 capsule 1   ONETOUCH VERIO test strip USE TO TEST BLOOD SUGAR EVERY DAY 100 strip 3   repaglinide (PRANDIN) 0.5 MG tablet Take 1 tablet (0.5 mg total) by mouth 2 (two) times daily before a meal. 180 tablet 3   Semaglutide, 2 MG/DOSE, (OZEMPIC, 2 MG/DOSE,) 8 MG/3ML SOPN Inject 2 mg into the skin once a week. 9 mL 3   WIXELA INHUB 250-50 MCG/DOSE AEPB INHALE 1 PUFF INTO THE LUNGS TWICE DAILY 60 each 1   No current facility-administered medications on file prior to visit.   Past Medical History:  Diagnosis Date   Allergy    Anemia    Asthma    Diabetes mellitus without complication (HCC) 04/2016   GERD (gastroesophageal reflux disease)    Hypertension    OSA (obstructive sleep apnea)    Sleep apnea    no cpap - told no OSA after told did so unsure if + or not    Past Surgical History:  Procedure Laterality Date   ABDOMINAL HYSTERECTOMY  02/19/2006   BICEPS TENDON REPAIR Bilateral    FOOT SURGERY     Left   KNEE ARTHROSCOPY Bilateral    ROTATOR CUFF REPAIR Bilateral     Allergies  Allergen Reactions   Codeine     Unable to Urinate    Family History  Problem Relation Age of Onset   Hyperlipidemia Mother    Stroke Mother    Heart disease Mother    Hypertension Mother    Diabetes Mother    Colon polyps Mother        TA+   Hyperlipidemia Father    Heart disease Father    Stroke Father    Hypertension Father    Diabetes Father    Colon cancer Father        Dx'd in his 31's    Diabetes Brother    Hypertension Maternal Grandmother    Diabetes Mellitus II Maternal Grandmother    Colon cancer Paternal Uncle    Esophageal cancer Neg Hx    Rectal cancer Neg Hx    Stomach cancer Neg  Hx     Social History   Socioeconomic History   Marital status: Married    Spouse name: Not on file   Number of children: 1   Years of education: Not on file   Highest education level: Master's degree (e.g., MA, MS, MEng, MEd, MSW, MBA)  Occupational History   Occupation: Solicitor  Tobacco Use   Smoking status: Every Day    Packs/day: 0.50    Types: Cigarettes    Last attempt to quit: 11/25/2018    Years since quitting: 2.7   Smokeless tobacco: Never   Tobacco comments:    down to 3 cigarettes per day  Vaping Use   Vaping Use: Never used  Substance and Sexual  Activity   Alcohol use: Yes    Comment: socially   Drug use: No   Sexual activity: Not on file  Other Topics Concern   Not on file  Social History Narrative   Not on file   Social Determinants of Health   Financial Resource Strain: Low Risk  (03/09/2021)   Overall Financial Resource Strain (CARDIA)    Difficulty of Paying Living Expenses: Not very hard  Food Insecurity: Food Insecurity Present (03/09/2021)   Hunger Vital Sign    Worried About Running Out of Food in the Last Year: Sometimes true    Ran Out of Food in the Last Year: Never true  Transportation Needs: No Transportation Needs (03/09/2021)   PRAPARE - Administrator, Civil Service (Medical): No    Lack of Transportation (Non-Medical): No  Physical Activity: Insufficiently Active (03/09/2021)   Exercise Vital Sign    Days of Exercise per Week: 4 days    Minutes of Exercise per Session: 30 min  Stress: Stress Concern Present (03/09/2021)   [employer] of Occupational Health - Occupational Stress Questionnaire    Feeling of Stress : To some extent  Social Connections: Unknown (03/09/2021)   Social Connection and Isolation Panel [NHANES]    Frequency of Communication with Friends and Family: More than three times a week    Frequency of Social Gatherings with Friends and Family: Once a week    Attends Religious Services:  Patient refused    Active Member of Clubs or Organizations: Yes    Attends Banker Meetings: 1 to 4 times per year    Marital Status: Married   Vitals:   08/15/21 0753  BP: 100/62  Pulse: 100  Resp: 16  SpO2: 98%  Body mass index is 43.55 kg/m. Wt Readings from Last 3 Encounters:  08/15/21 (!) 321 lb 2 oz (145.7 kg)  05/15/21 (!) 319 lb (144.7 kg)  04/19/21 (!) 320 lb 12.8 oz (145.5 kg)   Physical Exam Vitals and nursing note reviewed.  Constitutional:      General: She is not in acute distress.    Appearance: She is well-developed.  HENT:     Head: Normocephalic and atraumatic.     Right Ear: Hearing, tympanic membrane, ear canal and external ear normal.     Left Ear: Hearing, tympanic membrane, ear canal and external ear normal.     Nose: Congestion present.     Right Turbinates: Enlarged.     Left Turbinates: Enlarged.     Mouth/Throat:     Mouth: Mucous membranes are moist.     Pharynx: Oropharynx is clear. Uvula midline.  Eyes:     Extraocular Movements: Extraocular movements intact.     Conjunctiva/sclera: Conjunctivae normal.     Pupils: Pupils are equal, round, and reactive to light.  Neck:     Thyroid: No thyromegaly.     Trachea: No tracheal deviation.  Cardiovascular:     Rate and Rhythm: Normal rate and regular rhythm.     Pulses:          Dorsalis pedis pulses are 2+ on the right side and 2+ on the left side.     Heart sounds: No murmur heard. Pulmonary:     Effort: Pulmonary effort is normal. No respiratory distress.     Breath sounds: Normal breath sounds.  Abdominal:     Palpations: Abdomen is soft. There is no hepatomegaly or mass.     Tenderness: There is no  abdominal tenderness.  Genitourinary:    Comments: Deferred to gyn. Musculoskeletal:     Comments: No major deformity or signs of synovitis appreciated.  Lymphadenopathy:     Cervical: No cervical adenopathy.     Upper Body:     Right upper body: No supraclavicular  adenopathy.     Left upper body: No supraclavicular adenopathy.  Skin:    General: Skin is warm.     Findings: No erythema or rash.  Neurological:     General: No focal deficit present.     Mental Status: She is alert and oriented to person, place, and time.     Cranial Nerves: No cranial nerve deficit.     Coordination: Coordination normal.     Gait: Gait normal.     Deep Tendon Reflexes:     Reflex Scores:      Bicep reflexes are 2+ on the right side and 2+ on the left side.      Patellar reflexes are 2+ on the right side and 2+ on the left side. Psychiatric:     Comments: Well groomed, good eye contact.   ASSESSMENT AND PLAN:  Monica [name] was here today annual physical examination.  Orders Placed This Encounter  Procedures   Lipid panel   Ambulatory referral to Immunology   Lab Results  Component Value Date   CHOL 148 08/15/2021   HDL 50.60 08/15/2021   LDLCALC 79 08/15/2021   TRIG 93.0 08/15/2021   CHOLHDL 3 08/15/2021   Routine general medical examination at a health care facility We discussed the importance of regular physical activity and healthy diet for prevention of chronic illness and/or complications. Preventive guidelines reviewed. Vaccination up to date. Continue female care with gyn. Next CPE in a year.  Hypertension, essential, benign Reporting mildly low BP's , today SBP 100. Recommend decreasing dose of Amlodipine from 5 mg to 2.5 mg daily. No changes in Olmesartan-HCTZ. Monitor BP regularly and continue low salt diet. She has an appt with endocrinologist in 10/2021 and follows q 4-6 months, so I can see her annually, before if needed.  Hyperlipidemia associated with type 2 diabetes mellitus (HCC) Continue Lovastatin 20 mg daily and low fat diet. Further recommendations according to FLP result.  Allergic rhinitis Not well controlled. May benefit from immunotherapy, referral to immunologist placed.  Return in 1 year (on  08/16/2022).  Monica Giannelli G. [place], MD  [facility]. [place] office.

## 2021-08-15 ENCOUNTER — Encounter: Payer: Self-pay | Admitting: Family Medicine

## 2021-08-15 ENCOUNTER — Ambulatory Visit (INDEPENDENT_AMBULATORY_CARE_PROVIDER_SITE_OTHER): Payer: BC Managed Care – PPO | Admitting: Family Medicine

## 2021-08-15 ENCOUNTER — Ambulatory Visit: Payer: Self-pay

## 2021-08-15 VITALS — BP 128/70 | HR 99 | Ht 72.0 in | Wt 321.8 lb

## 2021-08-15 VITALS — BP 100/62 | HR 100 | Resp 16 | Ht 72.0 in | Wt 321.1 lb

## 2021-08-15 DIAGNOSIS — E785 Hyperlipidemia, unspecified: Secondary | ICD-10-CM | POA: Diagnosis not present

## 2021-08-15 DIAGNOSIS — E1169 Type 2 diabetes mellitus with other specified complication: Secondary | ICD-10-CM | POA: Diagnosis not present

## 2021-08-15 DIAGNOSIS — J309 Allergic rhinitis, unspecified: Secondary | ICD-10-CM | POA: Diagnosis not present

## 2021-08-15 DIAGNOSIS — I10 Essential (primary) hypertension: Secondary | ICD-10-CM | POA: Diagnosis not present

## 2021-08-15 DIAGNOSIS — Z0000 Encounter for general adult medical examination without abnormal findings: Secondary | ICD-10-CM

## 2021-08-15 DIAGNOSIS — M65311 Trigger thumb, right thumb: Secondary | ICD-10-CM | POA: Diagnosis not present

## 2021-08-15 LAB — LIPID PANEL
Cholesterol: 148 mg/dL (ref 0–200)
HDL: 50.6 mg/dL (ref >39.00)
LDL Cholesterol: 79 mg/dL (ref 0–99)
NonHDL: 97.69
Total CHOL/HDL Ratio: 3
Triglycerides: 93 mg/dL (ref 0.0–149.0)
VLDL: 18.6 mg/dL (ref 0.0–40.0)

## 2021-08-15 MED ORDER — AMLODIPINE BESYLATE 5 MG PO TABS: 2.5000 mg | ORAL_TABLET | Freq: Every day | ORAL | 2 refills | Status: DC

## 2021-09-08 ENCOUNTER — Ambulatory Visit: Payer: BC Managed Care – PPO | Admitting: Podiatry

## 2021-09-08 DIAGNOSIS — M7751 Other enthesopathy of right foot: Secondary | ICD-10-CM | POA: Diagnosis not present

## 2021-09-08 DIAGNOSIS — M7741 Metatarsalgia, right foot: Secondary | ICD-10-CM

## 2021-09-08 DIAGNOSIS — Q666 Other congenital valgus deformities of feet: Secondary | ICD-10-CM | POA: Diagnosis not present

## 2021-09-14 NOTE — Progress Notes (Signed)
Subjective:  Patient ID: Monica [name], female    DOB: [birth year],  MRN: [id number]  Chief Complaint  Patient presents with   Foot Pain    Right foot pain     46 y.o. female presents with the above complaint.  Patient presents with follow-up of right forefoot metatarsalgia.  She has been wearing the boot.  She states the pain feels a lot better.  She states the pain has become more localized.  She is also here to pick up orthotics   Review of Systems: Negative except as noted in the HPI. Denies N/V/F/Ch.  Past Medical History:  Diagnosis Date   Allergy    Anemia    Asthma    Diabetes mellitus without complication ([facility]) 86/5784   GERD (gastroesophageal reflux disease)    Hypertension    OSA (obstructive sleep apnea)    Sleep apnea    no cpap - told no OSA after told did so unsure if + or not     Current Outpatient Medications:    albuterol (VENTOLIN HFA) 108 (90 Base) MCG/ACT inhaler, INHALE 2 PUFFS INTO THE LUNGS EVERY 6 HOURS AS NEEDED FOR WHEEZING OR SHORTNESS OF BREATH, Disp: 8.5 g, Rfl: 3   amLODipine (NORVASC) 5 MG tablet, Take 0.5 tablets (2.5 mg total) by mouth daily., Disp: 45 tablet, Rfl: 2   ammonium lactate (AMLACTIN) 12 % cream, Apply topically as needed for dry skin., Disp: 385 g, Rfl: 6   Lancets (ONETOUCH DELICA PLUS ONGEXB28U) MISC, USE TO TEST BLOOD SUGAR EVERYDAY, Disp: 100 each, Rfl: 1   levocetirizine (XYZAL) 5 MG tablet, TAKE 1 TABLET(5 MG) BY MOUTH EVERY EVENING, Disp: 90 tablet, Rfl: 2   lovastatin (MEVACOR) 20 MG tablet, TAKE 1 TABLET(20 MG) BY MOUTH AT BEDTIME, Disp: 90 tablet, Rfl: 3   meloxicam (MOBIC) 15 MG tablet, Take 1 tablet (15 mg total) by mouth daily., Disp: 30 tablet, Rfl: 0   mometasone (NASONEX) 50 MCG/ACT nasal spray, SHAKE LIQUID AND USE 2 SPRAYS IN EACH NOSTRIL DAILY, Disp: 17 g, Rfl: 4   montelukast (SINGULAIR) 10 MG tablet, TAKE 1 TABLET(10 MG) BY MOUTH AT BEDTIME, Disp: 90 tablet, Rfl: 1   olmesartan-hydrochlorothiazide (BENICAR HCT)  40-25 MG tablet, TAKE 1 TABLET BY MOUTH DAILY, Disp: 90 tablet, Rfl: 2   omeprazole (PRILOSEC) 40 MG capsule, TAKE 1 CAPSULE(40 MG) BY MOUTH DAILY, Disp: 90 capsule, Rfl: 1   ONETOUCH VERIO test strip, USE TO TEST BLOOD SUGAR EVERY DAY, Disp: 100 strip, Rfl: 3   repaglinide (PRANDIN) 0.5 MG tablet, Take 1 tablet (0.5 mg total) by mouth 2 (two) times daily before a meal., Disp: 180 tablet, Rfl: 3   Semaglutide, 2 MG/DOSE, (OZEMPIC, 2 MG/DOSE,) 8 MG/3ML SOPN, Inject 2 mg into the skin once a week., Disp: 9 mL, Rfl: 3   WIXELA INHUB 250-50 MCG/DOSE AEPB, INHALE 1 PUFF INTO THE LUNGS TWICE DAILY, Disp: 60 each, Rfl: 1  Social History   Tobacco Use  Smoking Status Every Day   Packs/day: 0.50   Types: Cigarettes   Last attempt to quit: 11/25/2018   Years since quitting: 2.8  Smokeless Tobacco Never  Tobacco Comments   down to 3 cigarettes per day    Allergies  Allergen Reactions   Codeine     Unable to Urinate    Objective:  There were no vitals filed for this visit. There is no height or weight on file to calculate BMI. Constitutional Well developed. Well nourished.  Vascular Dorsalis pedis  pulses palpable bilaterally. Posterior tibial pulses palpable bilaterally. Capillary refill normal to all digits.  No cyanosis or clubbing noted. Pedal hair growth normal.  Neurologic Normal speech. Oriented to person, place, and time. Epicritic sensation to light touch grossly present bilaterally.  Dermatologic Nails well groomed and normal in appearance. No open wounds. No skin lesions.  Orthopedic: Pain narrowed down to third metatarsophalangeal joint.  Pain with range of motion of the joint pain on palpation to the joint.  No pain at the end any other joints.  No extensor or flexor tendinitis noted negative Mulder's click   Radiographs: 3 views of skeletally mature the right foot: No fractures noted.  Metatarsal parabola is within normal limits.  No bunion deformity noted.  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 Assessment:   1. Capsulitis of metatarsophalangeal (MTP) joint of right foot   2. Metatarsalgia, right foot   3. Pes planovalgus     Plan:  Patient was evaluated and treated and all questions answered.  Right forefoot metatarsalgia with underlying third metatarsophalangeal joint capsulitis -All questions and concerns were discussed with the patient in extensive detail. -Slowly transition away from cam boot into regular shoes.  Given the amount of pain that she is having and third metatarsophalangeal joint she will benefit from a steroid injection help decrease acute inflammatory component associate with pain.  Patient agrees with plan like to proceed with steroid injection -A steroid injection was performed at right third MTP using 1% plain Lidocaine and 10 mg of Kenalog. This was well tolerated. -Orthotics were dispensed and they are functioning well.  No follow-ups on file.

## 2021-11-01 ENCOUNTER — Ambulatory Visit: Payer: BC Managed Care – PPO | Admitting: Internal Medicine

## 2021-11-01 ENCOUNTER — Encounter: Payer: Self-pay | Admitting: Family Medicine

## 2021-11-01 ENCOUNTER — Encounter: Payer: Self-pay | Admitting: Internal Medicine

## 2021-11-01 VITALS — BP 120/68 | HR 81 | Ht 72.0 in | Wt 317.0 lb

## 2021-11-01 DIAGNOSIS — E1165 Type 2 diabetes mellitus with hyperglycemia: Secondary | ICD-10-CM

## 2021-11-01 LAB — POCT GLYCOSYLATED HEMOGLOBIN (HGB A1C): Hemoglobin A1C: 6.7 % — AB (ref 4.0–5.6)

## 2021-11-01 MED ORDER — REPAGLINIDE 0.5 MG PO TABS
0.5000 mg | ORAL_TABLET | Freq: Two times a day (BID) | ORAL | 3 refills | Status: DC
Start: 2021-11-01 — End: 2022-12-10

## 2021-11-01 MED ORDER — OZEMPIC (2 MG/DOSE) 8 MG/3ML ~~LOC~~ SOPN: 2.0000 mg | PEN_INJECTOR | SUBCUTANEOUS | 3 refills | Status: DC

## 2021-11-01 NOTE — Progress Notes (Signed)
Patient ID: Monica [name], female   DOB: 1975-11-07, 46 y.o.   MRN: [id number]  HPI: Monica [name] is a 46 y.o.-year-old female, returning for follow-up for DM2, dx in 2018 (GDM in 1999), non-insulin-dependent, uncontrolled, without long-term complications. Pt. previously saw Dr. Loanne Drilling, last visit 6 mo ago.  Reviewed HbA1c: Lab Results  Component Value Date   HGBA1C 6.9 (A) 04/19/2021   HGBA1C 7.2 (A) 01/18/2021   HGBA1C 7.4 (A) 10/17/2020   HGBA1C 6.5 (A) 06/20/2020   HGBA1C 6.6 (A) 01/08/2020   HGBA1C 7.3 (A) 09/15/2019   HGBA1C 6.9 (A) 07/13/2019   HGBA1C 7.6 (H) 12/26/2018   HGBA1C 7.3 (H) 06/09/2018   HGBA1C 7.4 (H) 08/07/2017   Pt is on a regimen of: - Repaglinide 0.5 mg 2x a day in am and at bedtime - Ozempic 2 mg weekly She tried metformin but this caused abdominal pain. She tried Victoza but this caused nausea. She was also on Trulicity. She tried [place] but this caused vaginitis. She was previously on insulin between 2020 and 2021.  Pt checks her sugars 0-1x a day and they are: - am: 120s - 2h after b'fast: n/c - before lunch: n/c - 2h after lunch: n/c - before dinner: 130s - 2h after dinner: 156 - bedtime: n/c - nighttime: n/c Lowest sugar was 86; she has hypoglycemia awareness <90.  Highest sugar was 156.  Glucometer: One Touch Verio  - no CKD, last BUN/creatinine:  Lab Results  Component Value Date   BUN 16 04/19/2021   BUN 15 10/26/2020   CREATININE 0.91 04/19/2021   CREATININE 0.89 10/26/2020  On olmesartan 40 mg daily.  -+ HL; last set of lipids: Lab Results  Component Value Date   CHOL 148 08/15/2021   HDL 50.60 08/15/2021   LDLCALC 79 08/15/2021   TRIG 93.0 08/15/2021   CHOLHDL 3 08/15/2021  On lovastatin 20 mg daily.  - last eye exam was 06/24/2021. No DR.   - no numbness and tingling in her feet.  Last foot exam 09/08/2021 by podiatry - was in a boot - R foot pain.  05/10/2021: CAC score = 0.  She also has a history of HTN,  ?OSA, GERD, asthma.  ROS: + see HPI No increased urination, blurry vision, nausea, chest pain.  Past Medical History:  Diagnosis Date   Allergy    Anemia    Asthma    Diabetes mellitus without complication ([place]) 65/6812   GERD (gastroesophageal reflux disease)    Hypertension    OSA (obstructive sleep apnea)    Sleep apnea    no cpap - told no OSA after told did so unsure if + or not    Past Surgical History:  Procedure Laterality Date   ABDOMINAL HYSTERECTOMY  02/19/2006   BICEPS TENDON REPAIR Bilateral    FOOT SURGERY     Left   KNEE ARTHROSCOPY Bilateral    ROTATOR CUFF REPAIR Bilateral    Social History   Socioeconomic History   Marital status: Married    Spouse name: Not on file   Number of children: 1   Years of education: Not on file   Highest education level: Master's degree (e.g., MA, MS, MEng, MEd, MSW, MBA)  Occupational History   Occupation: Software engineer  Tobacco Use   Smoking status: Every Day    Packs/day: 0.50    Types: Cigarettes    Last attempt to quit: 11/25/2018    Years since quitting: 2.9   Smokeless  tobacco: Never   Tobacco comments:    down to 3 cigarettes per day  Vaping Use   Vaping Use: Never used  Substance and Sexual Activity   Alcohol use: Yes    Comment: socially   Drug use: No   Sexual activity: Not on file  Other Topics Concern   Not on file  Social History Narrative   Not on file   Social Determinants of Health   Financial Resource Strain: Low Risk  (03/09/2021)   Overall Financial Resource Strain (CARDIA)    Difficulty of Paying Living Expenses: Not very hard  Food Insecurity: Food Insecurity Present (03/09/2021)   Hunger Vital Sign    Worried About Running Out of Food in the Last Year: Sometimes true    Ran Out of Food in the Last Year: Never true  Transportation Needs: No Transportation Needs (03/09/2021)   PRAPARE - Hydrologist (Medical): No    Lack of Transportation  (Non-Medical): No  Physical Activity: Insufficiently Active (03/09/2021)   Exercise Vital Sign    Days of Exercise per Week: 4 days    Minutes of Exercise per Session: 30 min  Stress: Stress Concern Present (03/09/2021)   [place]    Feeling of Stress : To some extent  Social Connections: Unknown (03/09/2021)   Social Connection and Isolation Panel [NHANES]    Frequency of Communication with Friends and Family: More than three times a week    Frequency of Social Gatherings with Friends and Family: Once a week    Attends Religious Services: Patient refused    Marine scientist or Organizations: Yes    Attends Archivist Meetings: 1 to 4 times per year    Marital Status: Married  Human resources officer Violence: Not on file   Current Outpatient Medications on File Prior to Visit  Medication Sig Dispense Refill   albuterol (VENTOLIN HFA) 108 (90 Base) MCG/ACT inhaler INHALE 2 PUFFS INTO THE LUNGS EVERY 6 HOURS AS NEEDED FOR WHEEZING OR SHORTNESS OF BREATH 8.5 g 3   amLODipine (NORVASC) 5 MG tablet Take 0.5 tablets (2.5 mg total) by mouth daily. 45 tablet 2   ammonium lactate (AMLACTIN) 12 % cream Apply topically as needed for dry skin. 385 g 6   Lancets (ONETOUCH DELICA PLUS [id number]) MISC USE TO TEST BLOOD SUGAR EVERYDAY 100 each 1   levocetirizine (XYZAL) 5 MG tablet TAKE 1 TABLET(5 MG) BY MOUTH EVERY EVENING 90 tablet 2   lovastatin (MEVACOR) 20 MG tablet TAKE 1 TABLET(20 MG) BY MOUTH AT BEDTIME 90 tablet 3   meloxicam (MOBIC) 15 MG tablet Take 1 tablet (15 mg total) by mouth daily. 30 tablet 0   mometasone (NASONEX) 50 MCG/ACT nasal spray SHAKE LIQUID AND USE 2 SPRAYS IN EACH NOSTRIL DAILY 17 g 4   montelukast (SINGULAIR) 10 MG tablet TAKE 1 TABLET(10 MG) BY MOUTH AT BEDTIME 90 tablet 1   olmesartan-hydrochlorothiazide (BENICAR HCT) 40-25 MG tablet TAKE 1 TABLET BY MOUTH DAILY 90 tablet 2   omeprazole (PRILOSEC)  40 MG capsule TAKE 1 CAPSULE(40 MG) BY MOUTH DAILY 90 capsule 1   ONETOUCH VERIO test strip USE TO TEST BLOOD SUGAR EVERY DAY 100 strip 3   repaglinide (PRANDIN) 0.5 MG tablet Take 1 tablet (0.5 mg total) by mouth 2 (two) times daily before a meal. 180 tablet 3   Semaglutide, 2 MG/DOSE, (OZEMPIC, 2 MG/DOSE,) 8 MG/3ML SOPN Inject 2 mg  into the skin once a week. 9 mL 3   WIXELA INHUB 250-50 MCG/DOSE AEPB INHALE 1 PUFF INTO THE LUNGS TWICE DAILY 60 each 1   No current facility-administered medications on file prior to visit.   Allergies  Allergen Reactions   Codeine     Unable to Urinate    Family History  Problem Relation Age of Onset   Hyperlipidemia Mother    Stroke Mother    Heart disease Mother    Hypertension Mother    Diabetes Mother    Colon polyps Mother        TA+   Hyperlipidemia Father    Heart disease Father    Stroke Father    Hypertension Father    Diabetes Father    Colon cancer Father        Dx'd in his 56's    Diabetes Brother    Hypertension Maternal Grandmother    Diabetes Mellitus II Maternal Grandmother    Colon cancer Paternal Uncle    Esophageal cancer Neg Hx    Rectal cancer Neg Hx    Stomach cancer Neg Hx    PE: There were no vitals taken for this visit. Wt Readings from Last 3 Encounters:  08/15/21 (!) 321 lb 12.8 oz (146 kg)  08/15/21 (!) 321 lb 2 oz (145.7 kg)  05/15/21 (!) 319 lb (144.7 kg)   Constitutional: obese, in NAD Eyes: no exophthalmos ENT: moist mucous membranes, no thyromegaly, no cervical lymphadenopathy Cardiovascular: RRR, No MRG Respiratory: CTA B Musculoskeletal: no deformities Skin: moist, warm, no rashes Neurological: no tremor with outstretched hands  ASSESSMENT: 1. DM2, non-insulin-dependent, uncontrolled, without long-term complications  2. HL  3.  History of hyperthyroidism  PLAN:  1. Patient with long-standing, uncontrolled diabetes, on oral antidiabetic regimen with glipizide and also weekly GLP-1 receptor  agonist, with fair control.  Latest HbA1c was better, at 6.9% in 04/2021, at last visit with Dr. Loanne Drilling.  At today's visit, HbA1c is 6.7% (lower). -At today's visit, sugars appear to be well controlled and she tolerates her repaglinide and Ozempic well.  No problems obtaining Ozempic.  I suggested to continue the same regimen for now, but I did advise her to move repaglinide from bedtime to dinnertime and to take it before the 2 main meals of the day. - I suggested to:  Patient Instructions  Please continue: - Repaglinide 0.5 mg 2x a day, but move this before the 2 main meals of the day - Ozempic 2 mg weekly  Please return in 4 months with your sugar log.   - check sugars at different times of the day - check 1x a day, rotating checks - discussed about CBG targets for treatment: 80-130 mg/dL before meals and <180 mg/dL after meals; target HbA1c <7%. - given foot care handout  - given instructions for hypoglycemia management "15-15 rule"  - advised for yearly eye exams  - Return to clinic in 4 mo with sugar log or glucometer  2. HL - Reviewed latest lipid panel from 07/2021: Fractions at goal: Lab Results  Component Value Date   CHOL 148 08/15/2021   HDL 50.60 08/15/2021   LDLCALC 79 08/15/2021   TRIG 93.0 08/15/2021   CHOLHDL 3 08/15/2021  - Continues lovastatin 20 mg daily without side effects.  3.  History of hyperthyroidism -Previously on treatment in 2006 -now resolved -Latest TFTs were reviewed from 04/2021 and they were normal  Philemon Kingdom, MD PhD [facility] Endocrinology

## 2021-11-01 NOTE — Patient Instructions (Addendum)
Please continue: - Repaglinide 0.5 mg 2x a day, but move this before the 2 main meals of the day - Ozempic 2 mg weekly  Please return in 4 months with your sugar log.   PATIENT INSTRUCTIONS FOR TYPE 2 DIABETES:  DIET AND EXERCISE Diet and exercise is an important part of diabetic treatment.  We recommended aerobic exercise in the form of brisk walking (working between 40-60% of maximal aerobic capacity, similar to brisk walking) for 150 minutes per week (such as 30 minutes five days per week) along with 3 times per week performing 'resistance' training (using various gauge rubber tubes with handles) 5-10 exercises involving the major muscle groups (upper body, lower body and core) performing 10-15 repetitions (or near fatigue) each exercise. Start at half the above goal but build slowly to reach the above goals. If limited by weight, joint pain, or disability, we recommend daily walking in a swimming pool with water up to waist to reduce pressure from joints while allow for adequate exercise.    BLOOD GLUCOSES Monitoring your blood glucoses is important for continued management of your diabetes. Please check your blood glucoses 2-4 times a day: fasting, before meals and at bedtime (you can rotate these measurements - e.g. one day check before the 3 meals, the next day check before 2 of the meals and before bedtime, etc.).   HYPOGLYCEMIA (low blood sugar) Hypoglycemia is usually a reaction to not eating, exercising, or taking too much insulin/ other diabetes drugs.  Symptoms include tremors, sweating, hunger, confusion, headache, etc. Treat IMMEDIATELY with 15 grams of Carbs: 4 glucose tablets  cup regular juice/soda 2 tablespoons raisins 4 teaspoons sugar 1 tablespoon honey Recheck blood glucose in 15 mins and repeat above if still symptomatic/blood glucose <100.  RECOMMENDATIONS TO REDUCE YOUR RISK OF DIABETIC COMPLICATIONS: * Take your prescribed MEDICATION(S) * Follow a DIABETIC diet:  Complex carbs, fiber rich foods, (monounsaturated and polyunsaturated) fats * AVOID saturated/trans fats, high fat foods, >2,300 mg salt per day. * EXERCISE at least 5 times a week for 30 minutes or preferably daily.  * DO NOT SMOKE OR DRINK more than 1 drink a day. * Check your FEET every day. Do not wear tightfitting shoes. Contact us if you develop an ulcer * See your EYE doctor once a year or more if needed * Get a FLU shot once a year * Get a PNEUMONIA vaccine once before and once after age 57 years  GOALS:  * Your Hemoglobin A1c of <7%  * fasting sugars need to be <130 * after meals sugars need to be <180 (2h after you start eating) * Your Systolic BP should be 696 or lower  * Your Diastolic BP should be 80 or lower  * Your HDL (Good Cholesterol) should be 40 or higher  * Your LDL (Bad Cholesterol) should be 100 or lower. * Your Triglycerides should be 150 or lower  * Your Urine microalbumin (kidney function) should be <30 * Your Body Mass Index should be 25 or lower    Please consider the following ways to cut down carbs and fat and increase fiber and micronutrients in your diet: - substitute whole grain for white bread or pasta - substitute brown rice for white rice - substitute 90-calorie flat bread pieces for slices of bread when possible - substitute sweet potatoes or yams for white potatoes - substitute humus for margarine - substitute tofu for cheese when possible - substitute almond or rice milk for regular milk (  would not drink soy milk daily due to concern for soy estrogen influence on breast cancer risk) - substitute dark chocolate for other sweets when possible - substitute water - can add lemon or orange slices for taste - for diet sodas (artificial sweeteners will trick your body that you can eat sweets without getting calories and will lead you to overeating and weight gain in the long run) - do not skip breakfast or other meals (this will slow down the metabolism  and will result in more weight gain over time)  - can try smoothies made from fruit and almond/rice milk in am instead of regular breakfast - can also try old-fashioned (not instant) oatmeal made with almond/rice milk in am - order the dressing on the side when eating salad at a restaurant (pour less than half of the dressing on the salad) - eat as little meat as possible - can try juicing, but should not forget that juicing will get rid of the fiber, so would alternate with eating raw veg./fruits or drinking smoothies - use as little oil as possible, even when using olive oil - can dress a salad with a mix of balsamic vinegar and lemon juice, for e.g. - use agave nectar, stevia sugar, or regular sugar rather than artificial sweateners - steam or broil/roast veggies  - snack on veggies/fruit/nuts (unsalted, preferably) when possible, rather than processed foods - reduce or eliminate aspartame in diet (it is in diet sodas, chewing gum, etc) Read the labels!  Try to read Dr. Janene Harvey book: "Program for Reversing Diabetes" for other ideas for healthy eating.

## 2021-11-11 ENCOUNTER — Other Ambulatory Visit: Payer: Self-pay | Admitting: Family Medicine

## 2021-11-11 DIAGNOSIS — K219 Gastro-esophageal reflux disease without esophagitis: Secondary | ICD-10-CM

## 2021-11-22 NOTE — Progress Notes (Deleted)
ACUTE VISIT No chief complaint on file.  HPI: [name] is a 46 y.o. female, who is here today complaining of *** HPI  Review of Systems Rest see pertinent positives and negatives per HPI.  Current Outpatient Medications on File Prior to Visit  Medication Sig Dispense Refill  . albuterol (VENTOLIN HFA) 108 (90 Base) MCG/ACT inhaler INHALE 2 PUFFS INTO THE LUNGS EVERY 6 HOURS AS NEEDED FOR WHEEZING OR SHORTNESS OF BREATH 8.5 g 3  . amLODipine (NORVASC) 5 MG tablet Take 0.5 tablets (2.5 mg total) by mouth daily. 45 tablet 2  . ammonium lactate (AMLACTIN) 12 % cream Apply topically as needed for dry skin. 385 g 6  . Lancets (ONETOUCH DELICA PLUS [id number]) MISC USE TO TEST BLOOD SUGAR EVERYDAY 100 each 1  . levocetirizine (XYZAL) 5 MG tablet TAKE 1 TABLET(5 MG) BY MOUTH EVERY EVENING 90 tablet 2  . lovastatin (MEVACOR) 20 MG tablet TAKE 1 TABLET(20 MG) BY MOUTH AT BEDTIME 90 tablet 3  . meloxicam (MOBIC) 15 MG tablet Take 1 tablet (15 mg total) by mouth daily. (Patient not taking: Reported on 11/01/2021) 30 tablet 0  . mometasone (NASONEX) 50 MCG/ACT nasal spray SHAKE LIQUID AND USE 2 SPRAYS IN EACH NOSTRIL DAILY 17 g 4  . montelukast (SINGULAIR) 10 MG tablet TAKE 1 TABLET(10 MG) BY MOUTH AT BEDTIME 90 tablet 1  . olmesartan-hydrochlorothiazide (BENICAR HCT) 40-25 MG tablet TAKE 1 TABLET BY MOUTH DAILY 90 tablet 2  . omeprazole (PRILOSEC) 40 MG capsule TAKE 1 CAPSULE(40 MG) BY MOUTH DAILY 90 capsule 1  . ONETOUCH VERIO test strip USE TO TEST BLOOD SUGAR EVERY DAY 100 strip 3  . repaglinide (PRANDIN) 0.5 MG tablet Take 1 tablet (0.5 mg total) by mouth 2 (two) times daily before a meal. 180 tablet 3  . Semaglutide, 2 MG/DOSE, (OZEMPIC, 2 MG/DOSE,) 8 MG/3ML SOPN Inject 2 mg into the skin once a week. 9 mL 3  . WIXELA INHUB 250-50 MCG/DOSE AEPB INHALE 1 PUFF INTO THE LUNGS TWICE DAILY 60 each 1   No current facility-administered medications on file prior to visit.     Past  Medical History:  Diagnosis Date  . Allergy   . Anemia   . Asthma   . Diabetes mellitus without complication ([place]) 89/3734  . GERD (gastroesophageal reflux disease)   . Hypertension   . OSA (obstructive sleep apnea)   . Sleep apnea    no cpap - told no OSA after told did so unsure if + or not    Allergies  Allergen Reactions  . Codeine     Unable to Urinate     Social History   Socioeconomic History  . Marital status: Married    Spouse name: Not on file  . Number of children: 1  . Years of education: Not on file  . Highest education level: Master's degree (e.g., MA, MS, MEng, MEd, MSW, MBA)  Occupational History  . Occupation: Software engineer  Tobacco Use  . Smoking status: Every Day    Packs/day: 0.50    Types: Cigarettes    Last attempt to quit: 11/25/2018    Years since quitting: 2.9  . Smokeless tobacco: Never  . Tobacco comments:    down to 3 cigarettes per day  Vaping Use  . Vaping Use: Never used  Substance and Sexual Activity  . Alcohol use: Yes    Comment: socially  . Drug use: No  . Sexual activity: Not on file  Other Topics Concern  . Not on file  Social History Narrative  . Not on file   Social Determinants of Health   Financial Resource Strain: Low Risk  (03/09/2021)   Overall Financial Resource Strain (CARDIA)   . Difficulty of Paying Living Expenses: Not very hard  Food Insecurity: Food Insecurity Present (03/09/2021)   Hunger Vital Sign   . Worried About Charity fundraiser in the Last Year: Sometimes true   . Ran Out of Food in the Last Year: Never true  Transportation Needs: No Transportation Needs (03/09/2021)   PRAPARE - Transportation   . Lack of Transportation (Medical): No   . Lack of Transportation (Non-Medical): No  Physical Activity: Insufficiently Active (03/09/2021)   Exercise Vital Sign   . Days of Exercise per Week: 4 days   . Minutes of Exercise per Session: 30 min  Stress: Stress Concern Present (03/09/2021)    [place]   . Feeling of Stress : To some extent  Social Connections: Unknown (03/09/2021)   Social Connection and Isolation Panel [NHANES]   . Frequency of Communication with Friends and Family: More than three times a week   . Frequency of Social Gatherings with Friends and Family: Once a week   . Attends Religious Services: Patient refused   . Active Member of Clubs or Organizations: Yes   . Attends Archivist Meetings: 1 to 4 times per year   . Marital Status: Married    There were no vitals filed for this visit. There is no height or weight on file to calculate BMI.  Physical Exam  ASSESSMENT AND PLAN:  There are no diagnoses linked to this encounter.   No follow-ups on file.   Betty G. Martinique, MD  [facility]. [place] office.  Discharge Instructions   None

## 2021-11-24 ENCOUNTER — Ambulatory Visit: Payer: BC Managed Care – PPO | Admitting: Family Medicine

## 2021-11-27 ENCOUNTER — Encounter: Payer: Self-pay | Admitting: *Deleted

## 2021-11-27 ENCOUNTER — Telehealth: Payer: Self-pay | Admitting: *Deleted

## 2021-11-27 NOTE — Telephone Encounter (Signed)
Letter has been sent to patient informing them that their sleep study has expired. Patient will need to call and schedule an office visit to re-evaluate the need for a sleep study.    

## 2021-11-27 NOTE — Progress Notes (Deleted)
ACUTE VISIT No chief complaint on file.  HPI: [name] is a 46 y.o. female, who is here today complaining of *** HPI  Review of Systems Rest see pertinent positives and negatives per HPI.  Current Outpatient Medications on File Prior to Visit  Medication Sig Dispense Refill  . albuterol (VENTOLIN HFA) 108 (90 Base) MCG/ACT inhaler INHALE 2 PUFFS INTO THE LUNGS EVERY 6 HOURS AS NEEDED FOR WHEEZING OR SHORTNESS OF BREATH 8.5 g 3  . amLODipine (NORVASC) 5 MG tablet Take 0.5 tablets (2.5 mg total) by mouth daily. 45 tablet 2  . ammonium lactate (AMLACTIN) 12 % cream Apply topically as needed for dry skin. 385 g 6  . Lancets (ONETOUCH DELICA PLUS [id number]) MISC USE TO TEST BLOOD SUGAR EVERYDAY 100 each 1  . levocetirizine (XYZAL) 5 MG tablet TAKE 1 TABLET(5 MG) BY MOUTH EVERY EVENING 90 tablet 2  . lovastatin (MEVACOR) 20 MG tablet TAKE 1 TABLET(20 MG) BY MOUTH AT BEDTIME 90 tablet 3  . meloxicam (MOBIC) 15 MG tablet Take 1 tablet (15 mg total) by mouth daily. (Patient not taking: Reported on 11/01/2021) 30 tablet 0  . mometasone (NASONEX) 50 MCG/ACT nasal spray SHAKE LIQUID AND USE 2 SPRAYS IN EACH NOSTRIL DAILY 17 g 4  . montelukast (SINGULAIR) 10 MG tablet TAKE 1 TABLET(10 MG) BY MOUTH AT BEDTIME 90 tablet 1  . olmesartan-hydrochlorothiazide (BENICAR HCT) 40-25 MG tablet TAKE 1 TABLET BY MOUTH DAILY 90 tablet 2  . omeprazole (PRILOSEC) 40 MG capsule TAKE 1 CAPSULE(40 MG) BY MOUTH DAILY 90 capsule 1  . ONETOUCH VERIO test strip USE TO TEST BLOOD SUGAR EVERY DAY 100 strip 3  . repaglinide (PRANDIN) 0.5 MG tablet Take 1 tablet (0.5 mg total) by mouth 2 (two) times daily before a meal. 180 tablet 3  . Semaglutide, 2 MG/DOSE, (OZEMPIC, 2 MG/DOSE,) 8 MG/3ML SOPN Inject 2 mg into the skin once a week. 9 mL 3  . WIXELA INHUB 250-50 MCG/DOSE AEPB INHALE 1 PUFF INTO THE LUNGS TWICE DAILY 60 each 1   No current facility-administered medications on file prior to visit.     Past  Medical History:  Diagnosis Date  . Allergy   . Anemia   . Asthma   . Diabetes mellitus without complication ([place]) 16/0737  . GERD (gastroesophageal reflux disease)   . Hypertension   . OSA (obstructive sleep apnea)   . Sleep apnea    no cpap - told no OSA after told did so unsure if + or not    Allergies  Allergen Reactions  . Codeine     Unable to Urinate     Social History   Socioeconomic History  . Marital status: Married    Spouse name: Not on file  . Number of children: 1  . Years of education: Not on file  . Highest education level: Master's degree (e.g., MA, MS, MEng, MEd, MSW, MBA)  Occupational History  . Occupation: Software engineer  Tobacco Use  . Smoking status: Every Day    Packs/day: 0.50    Types: Cigarettes    Last attempt to quit: 11/25/2018    Years since quitting: 3.0  . Smokeless tobacco: Never  . Tobacco comments:    down to 3 cigarettes per day  Vaping Use  . Vaping Use: Never used  Substance and Sexual Activity  . Alcohol use: Yes    Comment: socially  . Drug use: No  . Sexual activity: Not on file  Other Topics Concern  . Not on file  Social History Narrative  . Not on file   Social Determinants of Health   Financial Resource Strain: Low Risk  (03/09/2021)   Overall Financial Resource Strain (CARDIA)   . Difficulty of Paying Living Expenses: Not very hard  Food Insecurity: Food Insecurity Present (03/09/2021)   Hunger Vital Sign   . Worried About Charity fundraiser in the Last Year: Sometimes true   . Ran Out of Food in the Last Year: Never true  Transportation Needs: No Transportation Needs (03/09/2021)   PRAPARE - Transportation   . Lack of Transportation (Medical): No   . Lack of Transportation (Non-Medical): No  Physical Activity: Insufficiently Active (03/09/2021)   Exercise Vital Sign   . Days of Exercise per Week: 4 days   . Minutes of Exercise per Session: 30 min  Stress: Stress Concern Present (03/09/2021)    [place]   . Feeling of Stress : To some extent  Social Connections: Unknown (03/09/2021)   Social Connection and Isolation Panel [NHANES]   . Frequency of Communication with Friends and Family: More than three times a week   . Frequency of Social Gatherings with Friends and Family: Once a week   . Attends Religious Services: Patient refused   . Active Member of Clubs or Organizations: Yes   . Attends Archivist Meetings: 1 to 4 times per year   . Marital Status: Married    There were no vitals filed for this visit. There is no height or weight on file to calculate BMI.  Physical Exam  ASSESSMENT AND PLAN:  There are no diagnoses linked to this encounter.   No follow-ups on file.   Betty G. Martinique, MD  [facility]. [place] office.  Discharge Instructions   None

## 2021-11-28 ENCOUNTER — Ambulatory Visit: Payer: BC Managed Care – PPO | Admitting: Family Medicine

## 2021-12-10 ENCOUNTER — Other Ambulatory Visit: Payer: Self-pay | Admitting: Family Medicine

## 2021-12-10 DIAGNOSIS — J309 Allergic rhinitis, unspecified: Secondary | ICD-10-CM

## 2021-12-19 ENCOUNTER — Other Ambulatory Visit: Payer: Self-pay | Admitting: Family Medicine

## 2021-12-19 DIAGNOSIS — J309 Allergic rhinitis, unspecified: Secondary | ICD-10-CM

## 2022-01-01 ENCOUNTER — Other Ambulatory Visit: Payer: Self-pay | Admitting: Family Medicine

## 2022-01-01 DIAGNOSIS — Z1231 Encounter for screening mammogram for malignant neoplasm of breast: Secondary | ICD-10-CM

## 2022-01-03 NOTE — Progress Notes (Signed)
Error - pt only needed labs & flu shot.

## 2022-01-05 ENCOUNTER — Other Ambulatory Visit: Payer: Self-pay

## 2022-01-05 ENCOUNTER — Ambulatory Visit (INDEPENDENT_AMBULATORY_CARE_PROVIDER_SITE_OTHER): Payer: BC Managed Care – PPO | Admitting: Family Medicine

## 2022-01-05 ENCOUNTER — Encounter: Payer: Self-pay | Admitting: Family Medicine

## 2022-01-05 ENCOUNTER — Other Ambulatory Visit (INDEPENDENT_AMBULATORY_CARE_PROVIDER_SITE_OTHER): Payer: BC Managed Care – PPO

## 2022-01-05 DIAGNOSIS — E1169 Type 2 diabetes mellitus with other specified complication: Secondary | ICD-10-CM

## 2022-01-05 DIAGNOSIS — Z23 Encounter for immunization: Secondary | ICD-10-CM | POA: Diagnosis not present

## 2022-01-05 DIAGNOSIS — I10 Essential (primary) hypertension: Secondary | ICD-10-CM

## 2022-01-05 LAB — COMPREHENSIVE METABOLIC PANEL
ALT: 12 U/L (ref 0–35)
AST: 16 U/L (ref 0–37)
Albumin: 3.9 g/dL (ref 3.5–5.2)
Alkaline Phosphatase: 59 U/L (ref 39–117)
BUN: 14 mg/dL (ref 6–23)
CO2: 28 mEq/L (ref 19–32)
Calcium: 9.1 mg/dL (ref 8.4–10.5)
Chloride: 92 mEq/L — ABNORMAL LOW (ref 96–112)
Creatinine, Ser: 0.93 mg/dL (ref 0.40–1.20)
GFR: 72.55 mL/min (ref >60.00)
Glucose, Bld: 126 mg/dL — ABNORMAL HIGH (ref 70–99)
Potassium: 3.3 mEq/L — ABNORMAL LOW (ref 3.5–5.1)
Sodium: 124 mEq/L — ABNORMAL LOW (ref 135–145)
Total Bilirubin: 0.3 mg/dL (ref 0.2–1.2)
Total Protein: 7.3 g/dL (ref 6.0–8.3)

## 2022-01-05 LAB — MICROALBUMIN / CREATININE URINE RATIO
Creatinine,U: 153.7 mg/dL
Microalb Creat Ratio: 1.8 mg/g (ref 0.0–30.0)
Microalb, Ur: 2.7 mg/dL — ABNORMAL HIGH (ref 0.0–1.9)

## 2022-01-05 MED ORDER — OLMESARTAN MEDOXOMIL-HCTZ 40-25 MG PO TABS
1.0000 | ORAL_TABLET | Freq: Every day | ORAL | 2 refills | Status: DC
Start: 2022-01-05 — End: 2022-04-24

## 2022-01-05 MED ORDER — AMLODIPINE BESYLATE 5 MG PO TABS: 2.5000 mg | ORAL_TABLET | Freq: Every day | ORAL | 2 refills | Status: DC

## 2022-01-07 ENCOUNTER — Encounter: Payer: Self-pay | Admitting: Family Medicine

## 2022-01-09 ENCOUNTER — Telehealth (INDEPENDENT_AMBULATORY_CARE_PROVIDER_SITE_OTHER): Payer: BC Managed Care – PPO | Admitting: Family Medicine

## 2022-01-09 ENCOUNTER — Encounter: Payer: Self-pay | Admitting: Family Medicine

## 2022-01-09 VITALS — Ht 72.0 in

## 2022-01-09 DIAGNOSIS — J0110 Acute frontal sinusitis, unspecified: Secondary | ICD-10-CM | POA: Diagnosis not present

## 2022-01-09 DIAGNOSIS — I10 Essential (primary) hypertension: Secondary | ICD-10-CM | POA: Diagnosis not present

## 2022-01-09 DIAGNOSIS — R002 Palpitations: Secondary | ICD-10-CM

## 2022-01-09 DIAGNOSIS — E871 Hypo-osmolality and hyponatremia: Secondary | ICD-10-CM | POA: Diagnosis not present

## 2022-01-09 DIAGNOSIS — E876 Hypokalemia: Secondary | ICD-10-CM | POA: Diagnosis not present

## 2022-01-09 MED ORDER — AMOXICILLIN-POT CLAVULANATE 875-125 MG PO TABS
1.0000 | ORAL_TABLET | Freq: Two times a day (BID) | ORAL | 0 refills | Status: AC
Start: 2022-01-09 — End: 2022-01-17

## 2022-01-09 NOTE — Assessment & Plan Note (Signed)
She is reporting most home BP readings adequately controlled. For now she would like to continue same medications, has had good results in regard to BP control. We discussed some side effects. Continue amlodipine 5 mg daily and olmesartan-HCTZ 40-25 mg daily. Continue low-salt diet and monitoring BP regularly.

## 2022-01-09 NOTE — Progress Notes (Signed)
Virtual Visit via Video Note I connected with Monica [name] on 01/09/22 by a video enabled telemedicine application and verified that I am speaking with the correct person using two identifiers. Location patient: home Location provider:work office Persons participating in the virtual visit: patient, provider  I discussed the limitations of evaluation and management by telemedicine and the availability of in person appointments. The patient expressed understanding and agreed to proceed.  Chief Complaint  Patient presents with   Results   HPI: Monica [name] is a 46 year old female with history of DM 2, asthma, allergy rhinitis, hypertension, and hypothyroidism who is being seen today to go through recent lab results. Sodium was low at 124 and potassium was 3.3. Lab Results  Component Value Date   CREATININE 0.93 01/05/2022   BUN 14 01/05/2022   NA 124 (L) 01/05/2022   K 3.3 (L) 01/05/2022   CL 92 (L) 01/05/2022   CO2 28 01/05/2022   Lab Results  Component Value Date   ALT 12 01/05/2022   AST 16 01/05/2022   ALKPHOS 59 01/05/2022   BILITOT 0.3 01/05/2022   She denies any recent illness, OTC supplements, or new medications.  She reports experiencing headaches and palpitations.  Hypertension on amlodipine 5 mg daily and olmesartan-HCTZ 40-25 mg daily. Most of BPs at home are < 140/90.  Occasionally SBP in the low 90s.  She describes the headache as having a 6/10 intensity, frontal pressure. She has been experiencing these headaches intermittently for about a month.  Nasal congestion, rhinorrhea, postnasal drainage, and fatigue.  She has not noted fever or sore throat. Negative for visual changes or focal neurologic deficit. Negative for sick contacts or recent travel. She suspects a sinus infection and reports that her last course of antibiotics was taken a couple of years ago.  Palpitations have been going on for a while, she reports negative cardiac work up for this problem.  Denies new associated symptoms, it does not happen with exertion. No associated CP, diaphoresis, or SOB. Lab Results  Component Value Date   TSH 2.75 04/19/2021   ROS: See pertinent positives and negatives per HPI.  Past Medical History:  Diagnosis Date   Allergy    Anemia    Asthma    Diabetes mellitus without complication ([place]) 18/5631   GERD (gastroesophageal reflux disease)    Hypertension    OSA (obstructive sleep apnea)    Sleep apnea    no cpap - told no OSA after told did so unsure if + or not     Past Surgical History:  Procedure Laterality Date   ABDOMINAL HYSTERECTOMY  02/19/2006   BICEPS TENDON REPAIR Bilateral    FOOT SURGERY     Left   KNEE ARTHROSCOPY Bilateral    ROTATOR CUFF REPAIR Bilateral     Family History  Problem Relation Age of Onset   Hyperlipidemia Mother    Stroke Mother    Heart disease Mother    Hypertension Mother    Diabetes Mother    Colon polyps Mother        TA+   Hyperlipidemia Father    Heart disease Father    Stroke Father    Hypertension Father    Diabetes Father    Colon cancer Father        Dx'd in his 64's    Diabetes Brother    Hypertension Maternal Grandmother    Diabetes Mellitus II Maternal Grandmother    Colon cancer Paternal Uncle  Esophageal cancer Neg Hx    Rectal cancer Neg Hx    Stomach cancer Neg Hx     Social History   Socioeconomic History   Marital status: Married    Spouse name: Not on file   Number of children: 1   Years of education: Not on file   Highest education level: Master's degree (e.g., MA, MS, MEng, MEd, MSW, MBA)  Occupational History   Occupation: Software engineer  Tobacco Use   Smoking status: Every Day    Packs/day: 0.50    Types: Cigarettes    Last attempt to quit: 11/25/2018    Years since quitting: 3.1   Smokeless tobacco: Never   Tobacco comments:    down to 3 cigarettes per day  Vaping Use   Vaping Use: Never used  Substance and Sexual Activity   Alcohol  use: Yes    Comment: socially   Drug use: No   Sexual activity: Not on file  Other Topics Concern   Not on file  Social History Narrative   Not on file   Social Determinants of Health   Financial Resource Strain: Low Risk  (03/09/2021)   Overall Financial Resource Strain (CARDIA)    Difficulty of Paying Living Expenses: Not very hard  Food Insecurity: Food Insecurity Present (03/09/2021)   Hunger Vital Sign    Worried About Running Out of Food in the Last Year: Sometimes true    Ran Out of Food in the Last Year: Never true  Transportation Needs: No Transportation Needs (03/09/2021)   PRAPARE - Hydrologist (Medical): No    Lack of Transportation (Non-Medical): No  Physical Activity: Insufficiently Active (03/09/2021)   Exercise Vital Sign    Days of Exercise per Week: 4 days    Minutes of Exercise per Session: 30 min  Stress: Stress Concern Present (03/09/2021)   [place]    Feeling of Stress : To some extent  Social Connections: Unknown (03/09/2021)   Social Connection and Isolation Panel [NHANES]    Frequency of Communication with Friends and Family: More than three times a week    Frequency of Social Gatherings with Friends and Family: Once a week    Attends Religious Services: Patient refused    Marine scientist or Organizations: Yes    Attends Archivist Meetings: 1 to 4 times per year    Marital Status: Married  Human resources officer Violence: Not on file   Current Outpatient Medications:    albuterol (VENTOLIN HFA) 108 (90 Base) MCG/ACT inhaler, INHALE 2 PUFFS INTO THE LUNGS EVERY 6 HOURS AS NEEDED FOR WHEEZING OR SHORTNESS OF BREATH, Disp: 8.5 g, Rfl: 3   amLODipine (NORVASC) 5 MG tablet, Take 0.5 tablets (2.5 mg total) by mouth daily., Disp: 45 tablet, Rfl: 2   ammonium lactate (AMLACTIN) 12 % cream, Apply topically as needed for dry skin., Disp: 385 g, Rfl: 6    Lancets (ONETOUCH DELICA PLUS [id number]) MISC, USE TO TEST BLOOD SUGAR EVERYDAY, Disp: 100 each, Rfl: 1   levocetirizine (XYZAL) 5 MG tablet, TAKE 1 TABLET(5 MG) BY MOUTH EVERY EVENING, Disp: 90 tablet, Rfl: 2   lovastatin (MEVACOR) 20 MG tablet, TAKE 1 TABLET(20 MG) BY MOUTH AT BEDTIME, Disp: 90 tablet, Rfl: 3   meloxicam (MOBIC) 15 MG tablet, Take 1 tablet (15 mg total) by mouth daily., Disp: 30 tablet, Rfl: 0   mometasone (NASONEX) 50 MCG/ACT nasal spray,  SHAKE LIQUID AND USE 2 SPRAYS IN EACH NOSTRIL DAILY, Disp: 17 g, Rfl: 4   montelukast (SINGULAIR) 10 MG tablet, TAKE 1 TABLET(10 MG) BY MOUTH AT BEDTIME, Disp: 90 tablet, Rfl: 1   olmesartan-hydrochlorothiazide (BENICAR HCT) 40-25 MG tablet, Take 1 tablet by mouth daily., Disp: 90 tablet, Rfl: 2   omeprazole (PRILOSEC) 40 MG capsule, TAKE 1 CAPSULE(40 MG) BY MOUTH DAILY, Disp: 90 capsule, Rfl: 1   ONETOUCH VERIO test strip, USE TO TEST BLOOD SUGAR EVERY DAY, Disp: 100 strip, Rfl: 3   repaglinide (PRANDIN) 0.5 MG tablet, Take 1 tablet (0.5 mg total) by mouth 2 (two) times daily before a meal., Disp: 180 tablet, Rfl: 3   Semaglutide, 2 MG/DOSE, (OZEMPIC, 2 MG/DOSE,) 8 MG/3ML SOPN, Inject 2 mg into the skin once a week., Disp: 9 mL, Rfl: 3   WIXELA INHUB 250-50 MCG/DOSE AEPB, INHALE 1 PUFF INTO THE LUNGS TWICE DAILY, Disp: 60 each, Rfl: 1  EXAM:  VITALS per patient if applicable:Ht 6' (9.480 m)   BMI 42.99 kg/m   GENERAL: alert, oriented, appears well and in no acute distress  HEENT: atraumatic, conjunctiva clear, no obvious abnormalities on inspection of external nose and ears. Nasal congestion and pain when she applies pressure on frontal sinuses.  I do not appreciate edema or erythema.  NECK: normal movements of the head and neck  LUNGS: on inspection no signs of respiratory distress, breathing rate appears normal, no obvious gross SOB, gasping or wheezing  CV: no obvious cyanosis  MS: moves all visible extremities without  noticeable abnormality  PSYCH/NEURO: pleasant and cooperative, no obvious depression or anxiety, speech and thought processing grossly intact  ASSESSMENT AND PLAN: Discussed the following assessment and plan: Monica [name] is a 46 year old female seen today for follow up on recent blood work and frontal headache.  Acute non-recurrent frontal sinusitis Assessment & Plan: She has been having symptoms for about a month, so I think is appropriate to treat this possible bacterial sinus infection.  Augmentin 875-125 mg twice daily for 8 days. Explained that symptoms could also be caused by allergy rhinitis. Recommend also nasal saline irrigations.  Orders: -     Amoxicillin-Pot Clavulanate; Take 1 tablet by mouth 2 (two) times daily for 8 days.  Dispense: 16 tablet; Refill: 0  Hypertension, essential, benign Assessment & Plan: She is reporting most home BP readings adequately controlled. For now she would like to continue same medications, has had good results in regard to BP control. We discussed some side effects. Continue amlodipine 5 mg daily and olmesartan-HCTZ 40-25 mg daily. Continue low-salt diet and monitoring BP regularly.  Orders: -     CBC; Future  Hypokalemia Assessment & Plan: Mild. We discussed side effects of her antihypertensive medications, especially thiazide.  She has been on the same medication for years and no prior history of hypokalemia.  For now no changes in HCTZ dose. For now continue potassium rich diet. We will plan on checking BMP next Monday.  Orders: -     Basic metabolic panel; Future  Hyponatremia Assessment & Plan: Moderate, asymptomatic. We discussed possible etiologies, HCTZ is the most probable cause.  We discussed options, for now she would like to continue current antihypertensive medications. Continue fluid restriction for now. She was clearly instructed about warning signs, we discussed some symptoms of hyponatremia, if any she was  instructed to go to the ER. BMP in 6 days.   Orders: -     Basic metabolic panel; Future  Palpitations Assessment & Plan: She reports this problem is chronic and stable. She has been evaluated by cardiologist and had coronary calcium score and echo in 04/2021. Instructed about warning signs.  Orders: -     CBC; Future  We discussed possible serious and likely etiologies, options for evaluation and workup, limitations of telemedicine visit vs in person visit, treatment, treatment risks and precautions. The patient was advised to call back or seek an in-person evaluation if the symptoms worsen or if the condition fails to improve as anticipated. I discussed the assessment and treatment plan with the patient. The patient was provided an opportunity to ask questions and all were answered. The patient agreed with the plan and demonstrated an understanding of the instructions.  Return in about 1 week (around 01/16/2022) for Labs.  Kerem Gilmer G. Martinique, MD  [facility]. [place] office.

## 2022-01-09 NOTE — Assessment & Plan Note (Signed)
She has been having symptoms for about a month, so I think is appropriate to treat this possible bacterial sinus infection.  Augmentin 875-125 mg twice daily for 8 days. Explained that symptoms could also be caused by allergy rhinitis. Recommend also nasal saline irrigations.

## 2022-01-09 NOTE — Assessment & Plan Note (Signed)
She reports this problem is chronic and stable. She has been evaluated by cardiologist and had coronary calcium score and echo in 04/2021. Instructed about warning signs.

## 2022-01-09 NOTE — Assessment & Plan Note (Signed)
Mild. We discussed side effects of her antihypertensive medications, especially thiazide.  She has been on the same medication for years and no prior history of hypokalemia.  For now no changes in HCTZ dose. For now continue potassium rich diet. We will plan on checking BMP next Monday.

## 2022-01-09 NOTE — Assessment & Plan Note (Signed)
Moderate, asymptomatic. We discussed possible etiologies, HCTZ is the most probable cause.  We discussed options, for now she would like to continue current antihypertensive medications. Continue fluid restriction for now. She was clearly instructed about warning signs, we discussed some symptoms of hyponatremia, if any she was instructed to go to the ER. BMP in 6 days.

## 2022-01-15 ENCOUNTER — Other Ambulatory Visit (INDEPENDENT_AMBULATORY_CARE_PROVIDER_SITE_OTHER): Payer: BC Managed Care – PPO

## 2022-01-15 DIAGNOSIS — I10 Essential (primary) hypertension: Secondary | ICD-10-CM | POA: Diagnosis not present

## 2022-01-15 DIAGNOSIS — E871 Hypo-osmolality and hyponatremia: Secondary | ICD-10-CM

## 2022-01-15 DIAGNOSIS — E876 Hypokalemia: Secondary | ICD-10-CM

## 2022-01-15 DIAGNOSIS — R002 Palpitations: Secondary | ICD-10-CM | POA: Diagnosis not present

## 2022-01-15 LAB — BASIC METABOLIC PANEL
BUN: 12 mg/dL (ref 6–23)
CO2: 27 mEq/L (ref 19–32)
Calcium: 9.3 mg/dL (ref 8.4–10.5)
Chloride: 100 mEq/L (ref 96–112)
Creatinine, Ser: 0.96 mg/dL (ref 0.40–1.20)
GFR: 69.83 mL/min (ref >60.00)
Glucose, Bld: 178 mg/dL — ABNORMAL HIGH (ref 70–99)
Potassium: 4.4 mEq/L (ref 3.5–5.1)
Sodium: 133 mEq/L — ABNORMAL LOW (ref 135–145)

## 2022-01-15 LAB — CBC
HCT: 39 % (ref 36.0–46.0)
Hemoglobin: 12.8 g/dL (ref 12.0–15.0)
MCHC: 32.7 g/dL (ref 30.0–36.0)
MCV: 88.4 fl (ref 78.0–100.0)
Platelets: 203 10*3/uL (ref 150.0–400.0)
RBC: 4.41 Mil/uL (ref 3.87–5.11)
RDW: 14.6 % (ref 11.5–15.5)
WBC: 5.3 10*3/uL (ref 4.0–10.5)

## 2022-01-20 ENCOUNTER — Other Ambulatory Visit: Payer: Self-pay | Admitting: Family Medicine

## 2022-01-22 ENCOUNTER — Encounter: Payer: Self-pay | Admitting: Internal Medicine

## 2022-01-24 ENCOUNTER — Other Ambulatory Visit: Payer: Self-pay | Admitting: Internal Medicine

## 2022-01-24 MED ORDER — TRULICITY 3 MG/0.5ML ~~LOC~~ SOAJ: 3.0000 mg | SUBCUTANEOUS | 11 refills | Status: DC

## 2022-02-16 ENCOUNTER — Ambulatory Visit
Admission: RE | Admit: 2022-02-16 | Discharge: 2022-02-16 | Disposition: A | Discharge status: Home / Self Care | Payer: BC Managed Care – PPO | Source: Ambulatory Visit | Attending: Family Medicine | Admitting: Family Medicine

## 2022-02-16 DIAGNOSIS — Z1231 Encounter for screening mammogram for malignant neoplasm of breast: Secondary | ICD-10-CM

## 2022-03-02 ENCOUNTER — Encounter: Payer: Self-pay | Admitting: Podiatry

## 2022-03-02 ENCOUNTER — Ambulatory Visit (INDEPENDENT_AMBULATORY_CARE_PROVIDER_SITE_OTHER): Payer: BC Managed Care – PPO | Admitting: Podiatry

## 2022-03-02 VITALS — BP 108/49

## 2022-03-02 DIAGNOSIS — E1169 Type 2 diabetes mellitus with other specified complication: Secondary | ICD-10-CM

## 2022-03-02 DIAGNOSIS — B351 Tinea unguium: Secondary | ICD-10-CM

## 2022-03-02 DIAGNOSIS — M79674 Pain in right toe(s): Secondary | ICD-10-CM | POA: Diagnosis not present

## 2022-03-02 DIAGNOSIS — L84 Corns and callosities: Secondary | ICD-10-CM | POA: Diagnosis not present

## 2022-03-02 DIAGNOSIS — M79675 Pain in left toe(s): Secondary | ICD-10-CM | POA: Diagnosis not present

## 2022-03-02 DIAGNOSIS — B353 Tinea pedis: Secondary | ICD-10-CM

## 2022-03-02 MED ORDER — KETOCONAZOLE 2 % EX CREA: TOPICAL_CREAM | CUTANEOUS | 1 refills | Status: DC

## 2022-03-02 NOTE — Progress Notes (Signed)
  Subjective:  Patient ID: Monica [name], female    DOB: 1976-01-01,  MRN: [id number]  Monica [name] presents to clinic today for {jgcomplaint:23593}  Chief Complaint  Patient presents with   Nail Problem    Monica [name] BS-did not check today A1C-6.4 PCP-Betty Martinique PCP VST-01/2022   New problem(s): None. {jgcomplaint:23593}  PCP is Martinique, Betty G, MD.  Allergies  Allergen Reactions   Codeine     Unable to Urinate     Review of Systems: Negative except as noted in the HPI.  Objective: No changes noted in today's physical examination. Vitals:   03/02/22 0845  BP: (!) 108/49   [name] is a pleasant 47 y.o. female {jgbodyhabitus:24098} AAO x 3.  Vascular Examination: Capillary refill time to digits immediate b/l. Palpable DP pulse(s) b/l lower extremities Palpable PT pulse(s) b/l lower extremities Pedal hair sparse. Lower extremity skin temperature gradient within normal limits. No pain with calf compression b/l. No edema noted b/l lower extremities.  Dermatological Examination: No open wounds b/l lower extremities. No interdigital macerations b/l lower extremities.   Toenails 1-5 b/l elongated, discolored, dystrophic, thickened, crumbly with subungual debris and tenderness to dorsal palpation.   Hyperkeratotic lesion(s) submet head 5 left foot and submet head 5 right foot.  No erythema, no edema, no drainage, no fluctuance. Pedal skin noted to be dry and flaky b/l lower extremities.  Musculoskeletal Examination: Normal muscle strength 5/5 to all lower extremity muscle groups bilaterally. No pain crepitus or joint limitation noted with ROM b/l lower extremities.   Pes planus deformity noted b/l lower extremities. Patient ambulates independent of any assistive aids. Wearing appropriate fitting shoe gear.  Assessment/Plan: No diagnosis found.  No orders of the defined types were placed in this encounter.   None {Jgplan:23602::"-Patient/POA to call should there be  question/concern in the interim."}   Return in about 3 months (around 06/01/2022).  Marzetta Board, DPM

## 2022-03-05 ENCOUNTER — Ambulatory Visit: Payer: BC Managed Care – PPO | Admitting: Internal Medicine

## 2022-03-05 ENCOUNTER — Encounter: Payer: Self-pay | Admitting: Internal Medicine

## 2022-03-05 VITALS — BP 124/72 | HR 89 | Ht 72.0 in | Wt 313.0 lb

## 2022-03-05 DIAGNOSIS — E785 Hyperlipidemia, unspecified: Secondary | ICD-10-CM | POA: Diagnosis not present

## 2022-03-05 DIAGNOSIS — E1165 Type 2 diabetes mellitus with hyperglycemia: Secondary | ICD-10-CM | POA: Diagnosis not present

## 2022-03-05 DIAGNOSIS — E039 Hypothyroidism, unspecified: Secondary | ICD-10-CM

## 2022-03-05 DIAGNOSIS — E1169 Type 2 diabetes mellitus with other specified complication: Secondary | ICD-10-CM

## 2022-03-05 LAB — POCT GLYCOSYLATED HEMOGLOBIN (HGB A1C): Hemoglobin A1C: 6.8 % — AB (ref 4.0–5.6)

## 2022-03-05 MED ORDER — TIRZEPATIDE 5 MG/0.5ML ~~LOC~~ SOAJ: 5.0000 mg | SUBCUTANEOUS | 5 refills | Status: DC

## 2022-03-05 NOTE — Progress Notes (Signed)
Patient ID: Monica [name], female   DOB: 1975-12-27, 47 y.o.   MRN: [id number]  HPI: Monica [name] is a 47 y.o.-year-old female, returning for follow-up for DM2, dx in 2018 (GDM in 1999), non-insulin-dependent, uncontrolled, without long-term complications. Pt. previously saw Dr. Loanne Drilling, but last visit with me 4 months ago.  Interim history: No increased urination, blurry vision, nausea, chest pain. Since last visit, she had to switch from [place] to Trulicity due to availability at the pharmacy.  She does mention that she had poor night vision on Ozempic which improved on Trulicity.  For the last 2 to 3 weeks, however, she was off Trulicity, also, as she could not find it at the pharmacy.  Reviewed HbA1c: Lab Results  Component Value Date   HGBA1C 6.7 (A) 11/01/2021   HGBA1C 6.9 (A) 04/19/2021   HGBA1C 7.2 (A) 01/18/2021   HGBA1C 7.4 (A) 10/17/2020   HGBA1C 6.5 (A) 06/20/2020   HGBA1C 6.6 (A) 01/08/2020   HGBA1C 7.3 (A) 09/15/2019   HGBA1C 6.9 (A) 07/13/2019   HGBA1C 7.6 (H) 12/26/2018   HGBA1C 7.3 (H) 06/09/2018   Pt is on a regimen of: - Repaglinide 0.5 mg 2x a day in am and at bedtime >> before the 2 main meals of the day - Ozempic 2 mg weekly >> Trulicity 3 mg weekly >> off for 2-3 weeks She tried metformin but this caused abdominal pain. She tried Victoza but this caused nausea. She was also on Trulicity. She tried [place] but this caused vaginitis. She was previously on insulin between 2020 and 2021. She had poor night vision at night on Ozempic.  Pt checks her sugars 0-1x a day and they are: - am: 120s >> 90s, 124, 125 - 2h after b'fast: n/c - before lunch: n/c >> 140s - 2h after lunch: n/c - before dinner: 130s >> n/c - 2h after dinner: 156 >> n/c - bedtime: n/c - nighttime: n/c Lowest sugar was 86 >> 90; she has hypoglycemia awareness <90.  Highest sugar was 156 >> 400s (Prednisone).  Glucometer: One Touch Verio  - no CKD, last BUN/creatinine:  Lab Results   Component Value Date   BUN 12 01/15/2022   BUN 14 01/05/2022   CREATININE 0.96 01/15/2022   CREATININE 0.93 01/05/2022  On olmesartan 40 mg daily.  -+ HL; last set of lipids: Lab Results  Component Value Date   CHOL 148 08/15/2021   HDL 50.60 08/15/2021   LDLCALC 79 08/15/2021   TRIG 93.0 08/15/2021   CHOLHDL 3 08/15/2021  On lovastatin 20 mg daily.  - last eye exam was 06/24/2021. No DR.   - no numbness and tingling in her feet.  Last foot exam 09/08/2021 by podiatry - was in a boot - R foot pain.  05/10/2021: CAC score = 0.  She also has a history of HTN, ?OSA, GERD, asthma.  ROS: + see HPI  Past Medical History:  Diagnosis Date   Allergy    Anemia    Asthma    Diabetes mellitus without complication ([place]) 62/8315   GERD (gastroesophageal reflux disease)    Hypertension    OSA (obstructive sleep apnea)    Sleep apnea    no cpap - told no OSA after told did so unsure if + or not    Past Surgical History:  Procedure Laterality Date   ABDOMINAL HYSTERECTOMY  02/19/2006   BICEPS TENDON REPAIR Bilateral    FOOT SURGERY     Left   KNEE  ARTHROSCOPY Bilateral    ROTATOR CUFF REPAIR Bilateral    Social History   Socioeconomic History   Marital status: Married    Spouse name: Not on file   Number of children: 1   Years of education: Not on file   Highest education level: Master's degree (e.g., MA, MS, MEng, MEd, MSW, MBA)  Occupational History   Occupation: Software engineer  Tobacco Use   Smoking status: Every Day    Packs/day: 0.50    Types: Cigarettes    Last attempt to quit: 11/25/2018    Years since quitting: 3.2   Smokeless tobacco: Never   Tobacco comments:    down to 3 cigarettes per day  Vaping Use   Vaping Use: Never used  Substance and Sexual Activity   Alcohol use: Yes    Comment: socially   Drug use: No   Sexual activity: Not on file  Other Topics Concern   Not on file  Social History Narrative   Not on file   Social  Determinants of Health   Financial Resource Strain: Low Risk  (03/09/2021)   Overall Financial Resource Strain (CARDIA)    Difficulty of Paying Living Expenses: Not very hard  Food Insecurity: Food Insecurity Present (03/09/2021)   Hunger Vital Sign    Worried About Running Out of Food in the Last Year: Sometimes true    Ran Out of Food in the Last Year: Never true  Transportation Needs: No Transportation Needs (03/09/2021)   PRAPARE - Hydrologist (Medical): No    Lack of Transportation (Non-Medical): No  Physical Activity: Insufficiently Active (03/09/2021)   Exercise Vital Sign    Days of Exercise per Week: 4 days    Minutes of Exercise per Session: 30 min  Stress: Stress Concern Present (03/09/2021)   [place]    Feeling of Stress : To some extent  Social Connections: Unknown (03/09/2021)   Social Connection and Isolation Panel [NHANES]    Frequency of Communication with Friends and Family: More than three times a week    Frequency of Social Gatherings with Friends and Family: Once a week    Attends Religious Services: Patient refused    Marine scientist or Organizations: Yes    Attends Archivist Meetings: 1 to 4 times per year    Marital Status: Married  Human resources officer Violence: Not on file   Current Outpatient Medications on File Prior to Visit  Medication Sig Dispense Refill   Dulaglutide (TRULICITY) 3 JH/4.1DE SOPN Inject 3 mg into the skin once a week. 2 mL 11   albuterol (VENTOLIN HFA) 108 (90 Base) MCG/ACT inhaler INHALE 2 PUFFS INTO THE LUNGS EVERY 6 HOURS AS NEEDED FOR WHEEZING OR SHORTNESS OF BREATH 8.5 g 3   amLODipine (NORVASC) 5 MG tablet Take 0.5 tablets (2.5 mg total) by mouth daily. 45 tablet 2   ammonium lactate (AMLACTIN) 12 % cream Apply topically as needed for dry skin. 385 g 6   ketoconazole (NIZORAL) 2 % cream Apply to both feet and between toes  once daily for 6 weeks. 60 g 1   Lancets (ONETOUCH DELICA PLUS [id number]) MISC USE TO TEST BLOOD SUGAR EVERYDAY 100 each 1   levocetirizine (XYZAL) 5 MG tablet TAKE 1 TABLET(5 MG) BY MOUTH EVERY EVENING 90 tablet 2   lovastatin (MEVACOR) 20 MG tablet TAKE 1 TABLET(20 MG) BY MOUTH AT BEDTIME 90 tablet 3  meloxicam (MOBIC) 15 MG tablet Take 1 tablet (15 mg total) by mouth daily. 30 tablet 0   mometasone (NASONEX) 50 MCG/ACT nasal spray SHAKE LIQUID AND USE 2 SPRAYS IN EACH NOSTRIL DAILY 17 g 4   montelukast (SINGULAIR) 10 MG tablet TAKE 1 TABLET(10 MG) BY MOUTH AT BEDTIME 90 tablet 1   olmesartan-hydrochlorothiazide (BENICAR HCT) 40-25 MG tablet Take 1 tablet by mouth daily. 90 tablet 2   omeprazole (PRILOSEC) 40 MG capsule TAKE 1 CAPSULE(40 MG) BY MOUTH DAILY 90 capsule 1   ONETOUCH VERIO test strip USE TO TEST BLOOD SUGAR EVERY DAY 100 strip 3   repaglinide (PRANDIN) 0.5 MG tablet Take 1 tablet (0.5 mg total) by mouth 2 (two) times daily before a meal. 180 tablet 3   Semaglutide, 2 MG/DOSE, (OZEMPIC, 2 MG/DOSE,) 8 MG/3ML SOPN Inject 2 mg into the skin once a week. 9 mL 3   WIXELA INHUB 250-50 MCG/DOSE AEPB INHALE 1 PUFF INTO THE LUNGS TWICE DAILY 60 each 1   No current facility-administered medications on file prior to visit.   Allergies  Allergen Reactions   Codeine     Unable to Urinate    Family History  Problem Relation Age of Onset   Hyperlipidemia Mother    Stroke Mother    Heart disease Mother    Hypertension Mother    Diabetes Mother    Colon polyps Mother        TA+   Hyperlipidemia Father    Heart disease Father    Stroke Father    Hypertension Father    Diabetes Father    Colon cancer Father        Dx'd in his 65's    Diabetes Brother    Hypertension Maternal Grandmother    Diabetes Mellitus II Maternal Grandmother    Colon cancer Paternal Uncle    Esophageal cancer Neg Hx    Rectal cancer Neg Hx    Stomach cancer Neg Hx    PE: BP 124/72 (BP Location: Left  Arm, Patient Position: Sitting, Cuff Size: Normal)   Pulse 89   Ht 6' (1.829 m)   Wt (!) 313 lb (142 kg)   SpO2 96%   BMI 42.45 kg/m  Wt Readings from Last 3 Encounters:  03/05/22 (!) 313 lb (142 kg)  11/01/21 (!) 317 lb (143.8 kg)  08/15/21 (!) 321 lb 12.8 oz (146 kg)   Constitutional: obese, in NAD Eyes: no exophthalmos ENT:  no thyromegaly, no cervical lymphadenopathy Cardiovascular: RRR, No MRG Respiratory: CTA B Musculoskeletal: no deformities Skin: no rashes Neurological: no tremor with outstretched hands  ASSESSMENT: 1. DM2, non-insulin-dependent, uncontrolled, without long-term complications  2. HL  3.  History of hyperthyroidism  PLAN:  1. Patient with longstanding, previously uncontrolled diabetes, on oral antidiabetic regimen with meglitinide and previously on weekly injectable GLP-1 receptor agonist, but off in the last 2 to 3 weeks due to lack of availability.  At last visit, HbA1c was better, at 6.7%.  Sugars appears to be well-controlled, without significant hyper or hypoglycemia.  She was tolerating regimen well and had no problems obtaining her medications.  We continued to the same regimen but I did advise her to move repaglinide from bedtime to dinnertime; and take it before the 2 main meals of the day. - at today's visit, sugars are within target range in the morning, but they are higher before lunch, now off the GLP-1 receptor agonist.  We discussed about the possibility of adding this class  of medication back.  For now, she agrees to try [facility], if she can find her at the pharmacy.  Will start at a lower dose, 5 mg weekly.  Will continue repaglinide for now. - I suggested to:  Patient Instructions  Please continue: - Repaglinide 0.5 mg before the 2 main meals of the day  Try to start: - [place]ounjaro 5 mg weekly  Please return in 4 months with your sugar log.   - we checked her HbA1c: 6.8% (higher) - advised to check sugars at different times of the day -  1x a day, rotating check times - advised for yearly eye exams >> she is UTD - return to clinic in 3-4 months  2. HL -Reviewed latest lipid panel from 07/2021: Fractions at goal: Lab Results  Component Value Date   CHOL 148 08/15/2021   HDL 50.60 08/15/2021   LDLCALC 79 08/15/2021   TRIG 93.0 08/15/2021   CHOLHDL 3 08/15/2021  -She continues on lovastatin 20 mg daily-no side effects  3.  History of hyperthyroidism -On treatment previously (2006) -Now resolved -Latest TFTs were reviewed from 2023 and they were normal  Philemon Kingdom, MD PhD [facility]

## 2022-03-05 NOTE — Patient Instructions (Signed)
Please continue: - Repaglinide 0.5 mg before the 2 main meals of the day  Try to start: - Mounjaro 5 mg weekly  Please return in 4 months with your sugar log.

## 2022-03-07 ENCOUNTER — Encounter: Payer: Self-pay | Admitting: Family Medicine

## 2022-03-08 MED ORDER — AMLODIPINE BESYLATE 2.5 MG PO TABS: 2.5000 mg | ORAL_TABLET | Freq: Every day | ORAL | 2 refills | Status: DC

## 2022-03-22 ENCOUNTER — Emergency Department (HOSPITAL_COMMUNITY): Payer: BC Managed Care – PPO

## 2022-03-22 ENCOUNTER — Encounter: Payer: Self-pay | Admitting: Family Medicine

## 2022-03-22 ENCOUNTER — Other Ambulatory Visit: Payer: Self-pay

## 2022-03-22 ENCOUNTER — Encounter (HOSPITAL_COMMUNITY): Payer: Self-pay

## 2022-03-22 ENCOUNTER — Inpatient Hospital Stay (HOSPITAL_COMMUNITY)
Admission: EM | Admit: 2022-03-22 | Discharge: 2022-03-25 | DRG: 372 | Disposition: A | Discharge status: Home / Self Care | Payer: BC Managed Care – PPO | Attending: Internal Medicine | Admitting: Internal Medicine

## 2022-03-22 DIAGNOSIS — I10 Essential (primary) hypertension: Secondary | ICD-10-CM | POA: Diagnosis present

## 2022-03-22 DIAGNOSIS — E785 Hyperlipidemia, unspecified: Secondary | ICD-10-CM | POA: Diagnosis present

## 2022-03-22 DIAGNOSIS — R197 Diarrhea, unspecified: Secondary | ICD-10-CM

## 2022-03-22 DIAGNOSIS — K5289 Other specified noninfective gastroenteritis and colitis: Secondary | ICD-10-CM | POA: Diagnosis present

## 2022-03-22 DIAGNOSIS — Z90710 Acquired absence of both cervix and uterus: Secondary | ICD-10-CM

## 2022-03-22 DIAGNOSIS — Z1152 Encounter for screening for COVID-19: Secondary | ICD-10-CM | POA: Diagnosis not present

## 2022-03-22 DIAGNOSIS — N179 Acute kidney failure, unspecified: Secondary | ICD-10-CM | POA: Diagnosis present

## 2022-03-22 DIAGNOSIS — Z823 Family history of stroke: Secondary | ICD-10-CM

## 2022-03-22 DIAGNOSIS — Z833 Family history of diabetes mellitus: Secondary | ICD-10-CM | POA: Diagnosis not present

## 2022-03-22 DIAGNOSIS — Z800 Family history of malignant neoplasm of digestive organs: Secondary | ICD-10-CM | POA: Diagnosis not present

## 2022-03-22 DIAGNOSIS — B9620 Unspecified Escherichia coli [E. coli] as the cause of diseases classified elsewhere: Secondary | ICD-10-CM | POA: Diagnosis present

## 2022-03-22 DIAGNOSIS — A020 Salmonella enteritis: Secondary | ICD-10-CM | POA: Diagnosis not present

## 2022-03-22 DIAGNOSIS — Z8249 Family history of ischemic heart disease and other diseases of the circulatory system: Secondary | ICD-10-CM

## 2022-03-22 DIAGNOSIS — I959 Hypotension, unspecified: Secondary | ICD-10-CM | POA: Diagnosis present

## 2022-03-22 DIAGNOSIS — Z791 Long term (current) use of non-steroidal anti-inflammatories (NSAID): Secondary | ICD-10-CM

## 2022-03-22 DIAGNOSIS — G4730 Sleep apnea, unspecified: Secondary | ICD-10-CM | POA: Diagnosis present

## 2022-03-22 DIAGNOSIS — E871 Hypo-osmolality and hyponatremia: Secondary | ICD-10-CM | POA: Diagnosis present

## 2022-03-22 DIAGNOSIS — E860 Dehydration: Secondary | ICD-10-CM | POA: Diagnosis present

## 2022-03-22 DIAGNOSIS — E1169 Type 2 diabetes mellitus with other specified complication: Secondary | ICD-10-CM | POA: Diagnosis present

## 2022-03-22 DIAGNOSIS — K219 Gastro-esophageal reflux disease without esophagitis: Secondary | ICD-10-CM | POA: Diagnosis present

## 2022-03-22 DIAGNOSIS — Z83438 Family history of other disorder of lipoprotein metabolism and other lipidemia: Secondary | ICD-10-CM

## 2022-03-22 DIAGNOSIS — Z83719 Family history of colon polyps, unspecified: Secondary | ICD-10-CM

## 2022-03-22 DIAGNOSIS — Z885 Allergy status to narcotic agent status: Secondary | ICD-10-CM

## 2022-03-22 DIAGNOSIS — F17210 Nicotine dependence, cigarettes, uncomplicated: Secondary | ICD-10-CM | POA: Diagnosis present

## 2022-03-22 DIAGNOSIS — E876 Hypokalemia: Secondary | ICD-10-CM | POA: Diagnosis present

## 2022-03-22 DIAGNOSIS — J45909 Unspecified asthma, uncomplicated: Secondary | ICD-10-CM | POA: Diagnosis present

## 2022-03-22 DIAGNOSIS — K869 Disease of pancreas, unspecified: Secondary | ICD-10-CM | POA: Diagnosis present

## 2022-03-22 DIAGNOSIS — K529 Noninfective gastroenteritis and colitis, unspecified: Secondary | ICD-10-CM

## 2022-03-22 DIAGNOSIS — Z720 Tobacco use: Secondary | ICD-10-CM | POA: Insufficient documentation

## 2022-03-22 DIAGNOSIS — Z7985 Long-term (current) use of injectable non-insulin antidiabetic drugs: Secondary | ICD-10-CM

## 2022-03-22 DIAGNOSIS — Z79899 Other long term (current) drug therapy: Secondary | ICD-10-CM

## 2022-03-22 LAB — RESP PANEL BY RT-PCR (RSV, FLU A&B, COVID)  RVPGX2
Influenza A by PCR: NEGATIVE
Influenza B by PCR: NEGATIVE
Resp Syncytial Virus by PCR: NEGATIVE
SARS Coronavirus 2 by RT PCR: NEGATIVE

## 2022-03-22 LAB — COMPREHENSIVE METABOLIC PANEL
ALT: 15 U/L (ref 0–44)
AST: 31 U/L (ref 15–41)
Albumin: 3.5 g/dL (ref 3.5–5.0)
Alkaline Phosphatase: 56 U/L (ref 38–126)
Anion gap: 13 (ref 5–15)
BUN: 27 mg/dL — ABNORMAL HIGH (ref 6–20)
CO2: 21 mmol/L — ABNORMAL LOW (ref 22–32)
Calcium: 8.8 mg/dL — ABNORMAL LOW (ref 8.9–10.3)
Chloride: 98 mmol/L (ref 98–111)
Creatinine, Ser: 3.54 mg/dL — ABNORMAL HIGH (ref 0.44–1.00)
GFR, Estimated: 15 mL/min — ABNORMAL LOW (ref >60)
Glucose, Bld: 136 mg/dL — ABNORMAL HIGH (ref 70–99)
Potassium: 3.4 mmol/L — ABNORMAL LOW (ref 3.5–5.1)
Sodium: 132 mmol/L — ABNORMAL LOW (ref 135–145)
Total Bilirubin: 0.8 mg/dL (ref 0.3–1.2)
Total Protein: 8.1 g/dL (ref 6.5–8.1)

## 2022-03-22 LAB — URINALYSIS, ROUTINE W REFLEX MICROSCOPIC
Bilirubin Urine: NEGATIVE
Glucose, UA: NEGATIVE mg/dL
Hgb urine dipstick: NEGATIVE
Ketones, ur: 5 mg/dL — AB
Leukocytes,Ua: NEGATIVE
Nitrite: NEGATIVE
Protein, ur: 100 mg/dL — AB
Specific Gravity, Urine: 1.016 (ref 1.005–1.030)
pH: 5 (ref 5.0–8.0)

## 2022-03-22 LAB — CBC WITH DIFFERENTIAL/PLATELET
Abs Immature Granulocytes: 0.02 10*3/uL (ref 0.00–0.07)
Basophils Absolute: 0 10*3/uL (ref 0.0–0.1)
Basophils Relative: 1 %
Eosinophils Absolute: 0 10*3/uL (ref 0.0–0.5)
Eosinophils Relative: 1 %
HCT: 39.5 % (ref 36.0–46.0)
Hemoglobin: 13.2 g/dL (ref 12.0–15.0)
Immature Granulocytes: 0 %
Lymphocytes Relative: 20 %
Lymphs Abs: 1.1 10*3/uL (ref 0.7–4.0)
MCH: 28.8 pg (ref 26.0–34.0)
MCHC: 33.4 g/dL (ref 30.0–36.0)
MCV: 86.2 fL (ref 80.0–100.0)
Monocytes Absolute: 0.7 10*3/uL (ref 0.1–1.0)
Monocytes Relative: 13 %
Neutro Abs: 3.5 10*3/uL (ref 1.7–7.7)
Neutrophils Relative %: 65 %
Platelets: 180 10*3/uL (ref 150–400)
RBC: 4.58 MIL/uL (ref 3.87–5.11)
RDW: 14.1 % (ref 11.5–15.5)
WBC: 5.4 10*3/uL (ref 4.0–10.5)
nRBC: 0 % (ref 0.0–0.2)

## 2022-03-22 LAB — MAGNESIUM: Magnesium: 1.9 mg/dL (ref 1.7–2.4)

## 2022-03-22 LAB — GLUCOSE, CAPILLARY
Glucose-Capillary: 111 mg/dL — ABNORMAL HIGH (ref 70–99)
Glucose-Capillary: 93 mg/dL (ref 70–99)

## 2022-03-22 LAB — C DIFFICILE QUICK SCREEN W PCR REFLEX
C Diff antigen: NEGATIVE
C Diff interpretation: NOT DETECTED
C Diff toxin: NEGATIVE

## 2022-03-22 LAB — CBG MONITORING, ED: Glucose-Capillary: 131 mg/dL — ABNORMAL HIGH (ref 70–99)

## 2022-03-22 LAB — LIPASE, BLOOD: Lipase: 32 U/L (ref 11–51)

## 2022-03-22 MED ORDER — SODIUM CHLORIDE 0.9 % IV SOLN: INTRAVENOUS | Status: DC

## 2022-03-22 MED ORDER — INSULIN ASPART 100 UNIT/ML IJ SOLN: 0.0000 [IU] | Freq: Every day | INTRAMUSCULAR | Status: DC

## 2022-03-22 MED ORDER — SODIUM CHLORIDE 0.9 % IV BOLUS
1000.0000 mL | Freq: Once | INTRAVENOUS | Status: AC
  Administered 2022-03-22: 1000 mL via INTRAVENOUS

## 2022-03-22 MED ORDER — ENOXAPARIN SODIUM 40 MG/0.4ML IJ SOSY: 40.0000 mg | PREFILLED_SYRINGE | INTRAMUSCULAR | Status: DC

## 2022-03-22 MED ORDER — ACETAMINOPHEN 325 MG PO TABS
650.0000 mg | ORAL_TABLET | Freq: Four times a day (QID) | ORAL | Status: DC | PRN
  Administered 2022-03-23 – 2022-03-24 (×2): 650 mg via ORAL
  Filled 2022-03-22 (×2): qty 2

## 2022-03-22 MED ORDER — POTASSIUM CHLORIDE CRYS ER 20 MEQ PO TBCR
40.0000 meq | EXTENDED_RELEASE_TABLET | Freq: Every day | ORAL | Status: DC
  Administered 2022-03-22 – 2022-03-25 (×4): 40 meq via ORAL
  Filled 2022-03-22 (×4): qty 2

## 2022-03-22 MED ORDER — ENOXAPARIN SODIUM 80 MG/0.8ML IJ SOSY
70.0000 mg | PREFILLED_SYRINGE | INTRAMUSCULAR | Status: DC
  Administered 2022-03-22 – 2022-03-24 (×3): 70 mg via SUBCUTANEOUS
  Filled 2022-03-22 (×3): qty 0.8

## 2022-03-22 MED ORDER — MORPHINE SULFATE (PF) 4 MG/ML IV SOLN
4.0000 mg | Freq: Once | INTRAVENOUS | Status: AC
  Administered 2022-03-22: 4 mg via INTRAVENOUS
  Filled 2022-03-22: qty 1

## 2022-03-22 MED ORDER — ACETAMINOPHEN 650 MG RE SUPP: 650.0000 mg | Freq: Four times a day (QID) | RECTAL | Status: DC | PRN

## 2022-03-22 MED ORDER — METRONIDAZOLE 500 MG/100ML IV SOLN
500.0000 mg | Freq: Two times a day (BID) | INTRAVENOUS | Status: DC
  Administered 2022-03-22 – 2022-03-23 (×2): 500 mg via INTRAVENOUS
  Filled 2022-03-22 (×2): qty 100

## 2022-03-22 MED ORDER — INSULIN ASPART 100 UNIT/ML IJ SOLN
0.0000 [IU] | Freq: Three times a day (TID) | INTRAMUSCULAR | Status: DC
  Administered 2022-03-23: 2 [IU] via SUBCUTANEOUS
  Administered 2022-03-23: 3 [IU] via SUBCUTANEOUS
  Administered 2022-03-24: 2 [IU] via SUBCUTANEOUS

## 2022-03-22 MED ORDER — SODIUM CHLORIDE 0.9 % IV SOLN
2.0000 g | INTRAVENOUS | Status: DC
  Administered 2022-03-23 – 2022-03-25 (×3): 2 g via INTRAVENOUS
  Filled 2022-03-22 (×3): qty 20

## 2022-03-22 MED ORDER — SODIUM CHLORIDE 0.9 % IV SOLN
2.0000 g | Freq: Once | INTRAVENOUS | Status: AC
  Administered 2022-03-22: 2 g via INTRAVENOUS
  Filled 2022-03-22: qty 20

## 2022-03-22 MED ORDER — OXYCODONE HCL 5 MG PO TABS
5.0000 mg | ORAL_TABLET | ORAL | Status: DC | PRN
  Administered 2022-03-22: 5 mg via ORAL
  Filled 2022-03-22: qty 1

## 2022-03-22 MED ORDER — PROCHLORPERAZINE EDISYLATE 10 MG/2ML IJ SOLN: 10.0000 mg | Freq: Four times a day (QID) | INTRAMUSCULAR | Status: DC | PRN

## 2022-03-22 MED ORDER — ONDANSETRON HCL 4 MG/2ML IJ SOLN
4.0000 mg | Freq: Once | INTRAMUSCULAR | Status: AC
  Administered 2022-03-22: 4 mg via INTRAVENOUS
  Filled 2022-03-22: qty 2

## 2022-03-22 MED ORDER — METRONIDAZOLE 500 MG/100ML IV SOLN
500.0000 mg | Freq: Once | INTRAVENOUS | Status: AC
  Administered 2022-03-22: 500 mg via INTRAVENOUS
  Filled 2022-03-22: qty 100

## 2022-03-22 NOTE — H&P (Signed)
History and Physical    Patient: Monica [name] WLS:[id number] DOB: 1975-09-01 DOA: 03/22/2022 DOS: the patient was seen and examined on 03/22/2022 PCP: Martinique, Betty G, MD  Patient coming from: Home  Chief Complaint:  Chief Complaint  Patient presents with   Diarrhea   Hypotension   HPI: Monica [name] is a 47 y.o. female with medical history significant of GERD, DM2, HTN. Presenting with diarrhea and weakness. She reports that her symptoms began 4 days ago. She has had episodes of non-bloody diarrhea. She tried imodium to help, but it didn't. She hasn't had any recent abx. She hasn't had any diet change. She does report recently changing her DM meds to mounjaro. She also reports a recent COVID contact, but she is not sure if that person had GI COVID. She says she did home test and was negative. This morning she was trying to take a shower, when she became very faint. She did not pass out, but she felt weak enough that she needed to return to bed. When her symptoms did not improve, she decided to come to the ED for evaluation. She denies any other aggravating or alleviating factors.      Review of Systems: As mentioned in the history of present illness. All other systems reviewed and are negative. Past Medical History:  Diagnosis Date   Allergy    Anemia    Asthma    Diabetes mellitus without complication ([place]) 81/1572   GERD (gastroesophageal reflux disease)    Hypertension    OSA (obstructive sleep apnea)    Sleep apnea    no cpap - told no OSA after told did so unsure if + or not    Past Surgical History:  Procedure Laterality Date   ABDOMINAL HYSTERECTOMY  02/19/2006   BICEPS TENDON REPAIR Bilateral    FOOT SURGERY     Left   KNEE ARTHROSCOPY Bilateral    ROTATOR CUFF REPAIR Bilateral    Social History:  reports that she has been smoking cigarettes. She has been smoking an average of .5 packs per day. She has never used smokeless tobacco. She reports current alcohol use. She  reports that she does not use drugs.  Allergies  Allergen Reactions   Codeine     Unable to Urinate     Family History  Problem Relation Age of Onset   Hyperlipidemia Mother    Stroke Mother    Heart disease Mother    Hypertension Mother    Diabetes Mother    Colon polyps Mother        TA+   Hyperlipidemia Father    Heart disease Father    Stroke Father    Hypertension Father    Diabetes Father    Colon cancer Father        Dx'd in his 81's    Diabetes Brother    Hypertension Maternal Grandmother    Diabetes Mellitus II Maternal Grandmother    Colon cancer Paternal Uncle    Esophageal cancer Neg Hx    Rectal cancer Neg Hx    Stomach cancer Neg Hx     Prior to Admission medications   Medication Sig Start Date End Date Taking? Authorizing Provider  PREVIDENT 0.2 % SOLN Take by mouth. 02/28/22  Yes [provider]  albuterol (VENTOLIN HFA) 108 (90 Base) MCG/ACT inhaler INHALE 2 PUFFS INTO THE LUNGS EVERY 6 HOURS AS NEEDED FOR WHEEZING OR SHORTNESS OF BREATH 07/05/21   Martinique, Betty G, MD  amLODipine (NORVASC) 2.5 MG tablet Take 1 tablet (2.5 mg total) by mouth daily. 03/08/22   Martinique, Betty G, MD  ammonium lactate (AMLACTIN) 12 % cream Apply topically as needed for dry skin. 09/30/20   Marzetta Board, DPM  ketoconazole (NIZORAL) 2 % cream Apply to both feet and between toes once daily for 6 weeks. 03/02/22   Marzetta Board, DPM  Lancets (ONETOUCH DELICA PLUS [id number]) MISC USE TO TEST BLOOD SUGAR EVERYDAY 05/02/21   Martinique, Betty G, MD  levocetirizine (XYZAL) 5 MG tablet TAKE 1 TABLET(5 MG) BY MOUTH EVERY EVENING 12/20/21   Martinique, Betty G, MD  lovastatin (MEVACOR) 20 MG tablet TAKE 1 TABLET(20 MG) BY MOUTH AT BEDTIME 01/22/22   Martinique, Betty G, MD  meloxicam (MOBIC) 15 MG tablet Take 1 tablet (15 mg total) by mouth daily. 08/10/21   Felipa Furnace, DPM  mometasone (NASONEX) 50 MCG/ACT nasal spray SHAKE LIQUID AND USE 2 SPRAYS IN [facility] NOSTRIL DAILY 07/05/21    Martinique, Betty G, MD  montelukast (SINGULAIR) 10 MG tablet TAKE 1 TABLET(10 MG) BY MOUTH AT BEDTIME 12/11/21   Martinique, Betty G, MD  olmesartan-hydrochlorothiazide (BENICAR HCT) 40-25 MG tablet Take 1 tablet by mouth daily. 01/05/22   Martinique, Betty G, MD  omeprazole (PRILOSEC) 40 MG capsule TAKE 1 CAPSULE(40 MG) BY MOUTH DAILY 11/13/21   Martinique, Betty G, MD  [facility] VERIO test strip USE TO TEST BLOOD SUGAR EVERY DAY 02/01/21   Martinique, Betty G, MD  repaglinide (PRANDIN) 0.5 MG tablet Take 1 tablet (0.5 mg total) by mouth 2 (two) times daily before a meal. 11/01/21   Philemon Kingdom, MD  tirzepatide [facility]) 5 MG/0.5ML Pen Inject 5 mg into the skin once a week. 03/05/22   Philemon Kingdom, MD  [facility] INHUB 250-50 MCG/DOSE AEPB INHALE 1 PUFF INTO THE LUNGS TWICE DAILY 06/03/18   Martinique, Betty G, MD    Physical Exam: Vitals:   03/22/22 1330 03/22/22 1400 03/22/22 1500 03/22/22 1523  BP:  (!) 95/52 (!) 94/49   Pulse:  86 88   Resp:  19 15   Temp:    98 F (36.7 C)  TempSrc:    Oral  SpO2:  99% 100%   Weight: (!) 142 kg     Height: 6' (1.829 m)      General: 47 y.o. female resting in bed in NAD Eyes: PERRL, normal sclera ENMT: Nares patent w/o discharge, orophaynx clear, dentition normal, ears w/o discharge/lesions/ulcers Neck: Supple, trachea midline Cardiovascular: RRR, +S1, S2, no m/g/r, equal pulses throughout Respiratory: CTABL, no w/r/r, normal WOB GI: BS+, NDNT, no masses noted, no organomegaly noted MSK: No e/c/c Neuro: A&O x 3, no focal deficits Psyc: Appropriate interaction and affect, calm/cooperative  Data Reviewed:  Results for orders placed or performed during the hospital encounter of 03/22/22 (from the past 24 hour(s))  CBG monitoring, ED     Status: Abnormal   Collection Time: 03/22/22 11:18 AM  Result Value Ref Range   Glucose-Capillary 131 (H) 70 - 99 mg/dL  CBC with Differential     Status: None   Collection Time: 03/22/22 11:39 AM  Result Value Ref Range    WBC 5.4 4.0 - 10.5 K/uL   RBC 4.58 3.87 - 5.11 MIL/uL   Hemoglobin 13.2 12.0 - 15.0 g/dL   HCT 39.5 36.0 - 46.0 %   MCV 86.2 80.0 - 100.0 fL   MCH 28.8 26.0 - 34.0 pg   MCHC 33.4 30.0 - 36.0 g/dL  RDW 14.1 11.5 - 15.5 %   Platelets 180 150 - 400 K/uL   nRBC 0.0 0.0 - 0.2 %   Neutrophils Relative % 65 %   Neutro Abs 3.5 1.7 - 7.7 K/uL   Lymphocytes Relative 20 %   Lymphs Abs 1.1 0.7 - 4.0 K/uL   Monocytes Relative 13 %   Monocytes Absolute 0.7 0.1 - 1.0 K/uL   Eosinophils Relative 1 %   Eosinophils Absolute 0.0 0.0 - 0.5 K/uL   Basophils Relative 1 %   Basophils Absolute 0.0 0.0 - 0.1 K/uL   Immature Granulocytes 0 %   Abs Immature Granulocytes 0.02 0.00 - 0.07 K/uL  Comprehensive metabolic panel     Status: Abnormal   Collection Time: 03/22/22 11:39 AM  Result Value Ref Range   Sodium 132 (L) 135 - 145 mmol/L   Potassium 3.4 (L) 3.5 - 5.1 mmol/L   Chloride 98 98 - 111 mmol/L   CO2 21 (L) 22 - 32 mmol/L   Glucose, Bld 136 (H) 70 - 99 mg/dL   BUN 27 (H) 6 - 20 mg/dL   Creatinine, Ser 3.54 (H) 0.44 - 1.00 mg/dL   Calcium 8.8 (L) 8.9 - 10.3 mg/dL   Total Protein 8.1 6.5 - 8.1 g/dL   Albumin 3.5 3.5 - 5.0 g/dL   AST 31 15 - 41 U/L   ALT 15 0 - 44 U/L   Alkaline Phosphatase 56 38 - 126 U/L   Total Bilirubin 0.8 0.3 - 1.2 mg/dL   GFR, Estimated 15 (L) >60 mL/min   Anion gap 13 5 - 15  Lipase, blood     Status: None   Collection Time: 03/22/22 11:39 AM  Result Value Ref Range   Lipase 32 11 - 51 U/L  Magnesium     Status: None   Collection Time: 03/22/22 11:39 AM  Result Value Ref Range   Magnesium 1.9 1.7 - 2.4 mg/dL  C Difficile Quick Screen w PCR reflex     Status: None   Collection Time: 03/22/22 11:40 AM   Specimen: Stool  Result Value Ref Range   C Diff antigen NEGATIVE NEGATIVE   C Diff toxin NEGATIVE NEGATIVE   C Diff interpretation No C. difficile detected.    CT ab/pelvis 1. Fluid in the colon consistent with diarrhea with mild inflammatory fat stranding  surrounding the cecum/ascending colon likely reflecting mild infectious or inflammatory colitis. 2. Fullness of the pancreatic tail may reflect an intrapancreatic splenule; however pancreatic neoplasm is not excluded. Recommend nonemergent outpatient MRI with and without contrast for further evaluation. 3. Probable benign right adrenal adenoma requiring no specific imaging follow-up.  Assessment and Plan: Colitis Diarrhea     - admit to inpt, progressive     - continue abx for now     - C diff is negative; GI PCR pending; swab for COVID     - fluids, anti-emetics     - she recently started mounjaro; this could be her problem as well; will hold while she's inpt  AKI     - no obstruction seen on imaging     - fluids, follow labs  Hypokalemia Hyponatremia     - replace K+; Mg2+ is ok     - Na+ mildly low; fluids  DM2     - A1c, SSI, DM diet, glucose checks  HLD     - continue home regimen when confirmed  HTN     - her pressures are soft;  hold her home meds tonight  Tobacco abuse     - counsel against further use  Advance Care Planning:   Code Status: FULL  Consults: None  Family Communication: None at bedside  Severity of Illness: The appropriate patient status for this patient is INPATIENT. Inpatient status is judged to be reasonable and necessary in order to provide the required intensity of service to ensure the patient's safety. The patient's presenting symptoms, physical exam findings, and initial radiographic and laboratory data in the context of their chronic comorbidities is felt to place them at high risk for further clinical deterioration. Furthermore, it is not anticipated that the patient will be medically stable for discharge from the hospital within 2 midnights of admission.   * I certify that at the point of admission it is my clinical judgment that the patient will require inpatient hospital care spanning beyond 2 midnights from the point of admission due to  high intensity of service, high risk for further deterioration and high frequency of surveillance required.*  Author: Jonnie Finner, DO 03/22/2022 3:39 PM  For on call review [email or web address].

## 2022-03-22 NOTE — ED Triage Notes (Signed)
Diarrhea x4 days. Imodium w/o relief  Pt reports blurred vision worse in right eye and headaches that started this am upon awaking.  Denies n/v

## 2022-03-22 NOTE — ED Provider Notes (Signed)
[facility] Provider Note   CSN: [id number] Arrival date & time: 03/22/22  1108     History  Chief Complaint  Patient presents with   Diarrhea   Hypotension    Monica [name] is a 47 y.o. female.  Pt is a 47 yo female with a pmhx significant for htn, sleep apnea, DM, allergies, anemia, asthma, and gerd.  Pt has had severe diarrhea since 1/29.  Pt also has abd cramping.  She has tried imodium, but it has not helped.  Pt started feeling dizzy this am with standing this am which prompted her to come in.  She did start Mounjaro weekly on 1/15.         Home Medications Prior to Admission medications   Medication Sig Start Date End Date Taking? Authorizing Provider  PREVIDENT 0.2 % SOLN Take by mouth. 02/28/22  Yes [provider]  albuterol (VENTOLIN HFA) 108 (90 Base) MCG/ACT inhaler INHALE 2 PUFFS INTO THE LUNGS EVERY 6 HOURS AS NEEDED FOR WHEEZING OR SHORTNESS OF BREATH 07/05/21   Martinique, Betty G, MD  amLODipine (NORVASC) 2.5 MG tablet Take 1 tablet (2.5 mg total) by mouth daily. 03/08/22   [place], Betty G, MD  ammonium lactate (AMLACTIN) 12 % cream Apply topically as needed for dry skin. 09/30/20   Marzetta Board, DPM  ketoconazole (NIZORAL) 2 % cream Apply to both feet and between toes once daily for 6 weeks. 03/02/22   Marzetta Board, DPM  Lancets (ONETOUCH DELICA PLUS [id number]) MISC USE TO TEST BLOOD SUGAR EVERYDAY 05/02/21   Martin[place], Betty G, MD  levocetirizine (XYZAL) 5 MG tablet TAKE 1 TABLET(5 MG) BY MOUTH EVERY EVENING 12/20/21   Martin[place], Betty G, MD  lovastatin (MEVACOR) 20 MG tablet TAKE 1 TABLET(20 MG) BY MOUTH AT BEDTIME 01/22/22   [place], Betty G, MD  meloxicam (MOBIC) 15 MG tablet Take 1 tablet (15 mg total) by mouth daily. 08/10/21   Felipa Furnace, DPM  mometasone (NASONEX) 50 MCG/ACT nasal spray SHAKE LIQUID AND USE 2 SPRAYS IN [facility] NOSTRIL DAILY 07/05/21   Martin[place], Betty G, MD  montelukast (SINGULAIR) 10 MG  tablet TAKE 1 TABLET(10 MG) BY MOUTH AT BEDTIME 12/11/21   [place], Betty G, MD  olmesartan-hydrochlorothiazide (BENICAR HCT) 40-25 MG tablet Take 1 tablet by mouth daily. 01/05/22   [place], Betty G, MD  omeprazole (PRILOSEC) 40 MG capsule TAKE 1 CAPSULE(40 MG) BY MOUTH DAILY 11/13/21   Martinique, Betty G, MD  [facility] VERIO test strip USE TO TEST BLOOD SUGAR EVERY DAY 02/01/21   Martin[place], Betty G, MD  repaglinide (PRANDIN) 0.5 MG tablet Take 1 tablet (0.5 mg total) by mouth 2 (two) times daily before a meal. 11/01/21   Philemon Kingdom, MD  tirzepatide [facility]) 5 MG/0.5ML Pen Inject 5 mg into the skin once a week. 03/05/22   Philemon Kingdom, MD  [facility] INHUB 250-50 MCG/DOSE AEPB INHALE 1 PUFF INTO THE LUNGS TWICE DAILY 06/03/18   Martin[place], Betty G, MD      Allergies    Codeine    Review of Systems   Review of Systems  Constitutional:  Positive for fatigue.  Gastrointestinal:  Positive for diarrhea.  Neurological:  Positive for weakness.  All other systems reviewed and are negative.   Physical Exam Updated Vital Signs BP (!) 95/52 (BP Location: Left Arm)   Pulse 86   Temp 98.5 F (36.9 C) (Oral)   Resp 19   Ht 6' (1.829  m)   Wt (!) 142 kg   SpO2 99%   BMI 42.46 kg/m  Physical Exam Vitals and nursing note reviewed.  Constitutional:      Appearance: Normal appearance. She is obese.  HENT:     Head: Normocephalic and atraumatic.     Right Ear: External ear normal.     Left Ear: External ear normal.     Nose: Nose normal.     Mouth/Throat:     Mouth: Mucous membranes are dry.  Eyes:     Extraocular Movements: Extraocular movements intact.     Conjunctiva/sclera: Conjunctivae normal.     Pupils: Pupils are equal, round, and reactive to light.  Cardiovascular:     Rate and Rhythm: Regular rhythm. Tachycardia present.     Pulses: Normal pulses.     Heart sounds: Normal heart sounds.  Pulmonary:     Effort: Pulmonary effort is normal.     Breath sounds: Normal breath sounds.   Abdominal:     General: Abdomen is flat. Bowel sounds are normal.     Palpations: Abdomen is soft.     Tenderness: There is abdominal tenderness.  Musculoskeletal:        General: Normal range of motion.     Cervical back: Normal range of motion and neck supple.  Skin:    General: Skin is warm.     Capillary Refill: Capillary refill takes 2 to 3 seconds.  Neurological:     General: No focal deficit present.     Mental Status: She is alert and oriented to person, place, and time.  Psychiatric:        Mood and Affect: Mood normal.        Behavior: Behavior normal.     ED Results / Procedures / Treatments   Labs (all labs ordered are listed, but only abnormal results are displayed) Labs Reviewed  COMPREHENSIVE METABOLIC PANEL - Abnormal; Notable for the following components:      Result Value   Sodium 132 (*)    Potassium 3.4 (*)    CO2 21 (*)    Glucose, Bld 136 (*)    BUN 27 (*)    Creatinine, Ser 3.54 (*)    Calcium 8.8 (*)    GFR, Estimated 15 (*)    All other components within normal limits  CBG MONITORING, ED - Abnormal; Notable for the following components:   Glucose-Capillary 131 (*)    All other components within normal limits  C DIFFICILE QUICK SCREEN W PCR REFLEX    GASTROINTESTINAL PANEL BY PCR, STOOL (REPLACES STOOL CULTURE)  CBC WITH DIFFERENTIAL/PLATELET  LIPASE, BLOOD  MAGNESIUM  URINALYSIS, ROUTINE W REFLEX MICROSCOPIC    EKG EKG Interpretation  Date/Time:  Thursday March 22 2022 11:54:00 EST Ventricular Rate:  101 PR Interval:  145 QRS Duration: 94 QT Interval:  312 QTC Calculation: 405 R Axis:   40 Text Interpretation: Sinus tachycardia Borderline T abnormalities, diffuse leads Since last tracing rate faster Confirmed by Isla Pence [phone]) on 03/22/2022 12:01:18 PM  Radiology CT ABDOMEN PELVIS WO CONTRAST  Result Date: 03/22/2022 CLINICAL DATA:  Diarrhea for 4 days. EXAM: CT ABDOMEN AND PELVIS WITHOUT CONTRAST TECHNIQUE: Multidetector  CT imaging of the abdomen and pelvis was performed following the standard protocol without IV contrast. RADIATION DOSE REDUCTION: This exam was performed according to the departmental dose-optimization program which includes automated exposure control, adjustment of the mA and/or kV according to patient size and/or use of iterative reconstruction technique. COMPARISON:  None Available. FINDINGS: Lower chest: The lung bases are clear. The imaged heart is unremarkable. Hepatobiliary: The liver is hypoattenuating likely reflecting fatty infiltration. The gallbladder is unremarkable. There is no biliary ductal dilatation. Pancreas: There is 1.9 cm x 1.4 cm rounded lesion which appears to be in conjunction with the pancreatic tail (2-30, 6-103). There is no other focal lesion or pancreatic contour abnormality. There is no main pancreatic ductal dilatation or peripancreatic inflammatory change. Spleen: Unremarkable. Adrenals/Urinary Tract: There is a 1.1 cm x 0.8 cm right adrenal nodule measuring 6 Hounsfield units likely reflecting a benign adenoma (5-73). The left adrenal is unremarkable. The kidneys are unremarkable, with no focal lesion, stone, hydronephrosis, or hydroureter. The bladder is decompressed but grossly unremarkable. Stomach/Bowel: The stomach is unremarkable. There is no evidence of bowel obstruction. There is fluid in the colon consistent with the provided history of diarrhea. There is mild inflammatory fat stranding around the cecum and ascending colon. There is no other bowel wall thickening or inflammatory change. Vascular/Lymphatic: The abdominal aorta is normal in course and caliber. There is no abdominopelvic lymphadenopathy. Reproductive: The uterus is surgically absent. There is no adnexal mass. Other: There is no ascites or free Musculoskeletal: Air. There is no acute osseous abnormality or suspicious osseous lesion. IMPRESSION: 1. Fluid in the colon consistent with diarrhea with mild  inflammatory fat stranding surrounding the cecum/ascending colon likely reflecting mild infectious or inflammatory colitis. 2. Fullness of the pancreatic tail may reflect an intrapancreatic splenule; however pancreatic neoplasm is not excluded. Recommend nonemergent outpatient MRI with and without contrast for further evaluation. 3. Probable benign right adrenal adenoma requiring no specific imaging follow-up. Electronically Signed   By: Valetta Mole M.D.   On: 03/22/2022 14:08    Procedures Procedures    Medications Ordered in ED Medications  cefTRIAXone (ROCEPHIN) 2 g in sodium chloride 0.9 % 100 mL IVPB (2 g Intravenous New Bag/Given 03/22/22 1437)    And  metroNIDAZOLE (FLAGYL) IVPB 500 mg (has no administration in time range)  sodium chloride 0.9 % bolus 1,000 mL (0 mLs Intravenous Stopped 03/22/22 1314)  ondansetron (ZOFRAN) injection 4 mg (4 mg Intravenous Given 03/22/22 1153)  morphine (PF) 4 MG/ML injection 4 mg (4 mg Intravenous Given 03/22/22 1153)  sodium chloride 0.9 % bolus 1,000 mL (0 mLs Intravenous Stopped 03/22/22 1401)  sodium chloride 0.9 % bolus 1,000 mL (1,000 mLs Intravenous New Bag/Given 03/22/22 1440)    ED Course/ Medical Decision Making/ A&P                             Medical Decision Making Amount and/or Complexity of Data Reviewed Labs: ordered. Radiology: ordered.  Risk Prescription drug management. Decision regarding hospitalization.   This patient presents to the ED for concern of diarrhea, this involves an extensive number of treatment options, and is a complaint that carries with it a high risk of complications and morbidity.  The differential diagnosis includes electrolyte abn, med rxn, infection   Co morbidities that complicate the patient evaluation  htn, sleep apnea, DM, allergies, anemia, asthma, and gerd   Additional history obtained:  Additional history obtained from epic chart review    Lab Tests:  I Ordered, and personally interpreted  labs.  The pertinent results include:  cbc nl, cmp with bun 27 and cr 3.54 (BUN 12 and cr 0.96 on 11/28); mg nl; lip nl; c.diff neg   Imaging Studies ordered:  I ordered imaging  studies including ct abd/pelvis  I independently visualized and interpreted imaging which showed  Fluid in the colon consistent with diarrhea with mild  inflammatory fat stranding surrounding the cecum/ascending colon  likely reflecting mild infectious or inflammatory colitis.  2. Fullness of the pancreatic tail may reflect an intrapancreatic  splenule; however pancreatic neoplasm is not excluded. Recommend  nonemergent outpatient MRI with and without contrast for further  evaluation.  3. Probable benign right adrenal adenoma requiring no specific  imaging follow-up.   I agree with the radiologist interpretation   Cardiac Monitoring:  The patient was maintained on a cardiac monitor.  I personally viewed and interpreted the cardiac monitored which showed an underlying rhythm of: st   Medicines ordered and prescription drug management:  I ordered medication including morphine and zofran  for pain and nausea  Reevaluation of the patient after these medicines showed that the patient improved I have reviewed the patients home medicines and have made adjustments as needed   Test Considered:  ct   Critical Interventions:  ivfs   Consultations Obtained:  I requested consultation with the hospitalist (Dr. Marylyn Ishihara),  and discussed lab and imaging findings as well as pertinent plan - he will admit   Problem List / ED Course:  Diarrhea:  pt started on IVFs and IV rocephin/flagyl.  New DM med Darcel Bayley may be contributing. AKI:  likely dehydration due to diarrhea Hypotension:  due to dehydration   Reevaluation:  After the interventions noted above, I reevaluated the patient and found that they have :improved   Social Determinants of Health:  Lives at home   Dispostion:  After consideration of  the diagnostic results and the patients response to treatment, I feel that the patent would benefit from admission.    CRITICAL CARE Performed by: Isla Pence   Total critical care time: 30 minutes  Critical care time was exclusive of separately billable procedures and treating other patients.  Critical care was necessary to treat or prevent imminent or life-threatening deterioration.  Critical care was time spent personally by me on the following activities: development of treatment plan with patient and/or surrogate as well as nursing, discussions with consultants, evaluation of patient's response to treatment, examination of patient, obtaining history from patient or surrogate, ordering and performing treatments and interventions, ordering and review of laboratory studies, ordering and review of radiographic studies, pulse oximetry and re-evaluation of patient's condition.         Final Clinical Impression(s) / ED Diagnoses Final diagnoses:  Dehydration  AKI (acute kidney injury) ([place])  Diarrhea, unspecified type  Colitis  Pancreatic lesion    Rx / DC Orders ED Discharge Orders     None         Isla Pence, MD 03/22/22 1503

## 2022-03-22 NOTE — ED Notes (Signed)
Patient transported to CT 

## 2022-03-22 NOTE — Plan of Care (Signed)
  Problem: Coping: Goal: Ability to adjust to condition or change in health will improve Outcome: Progressing   Problem: Skin Integrity: Goal: Risk for impaired skin integrity will decrease Outcome: Progressing

## 2022-03-23 DIAGNOSIS — R197 Diarrhea, unspecified: Secondary | ICD-10-CM | POA: Diagnosis not present

## 2022-03-23 LAB — COMPREHENSIVE METABOLIC PANEL
ALT: 13 U/L (ref 0–44)
AST: 21 U/L (ref 15–41)
Albumin: 3 g/dL — ABNORMAL LOW (ref 3.5–5.0)
Alkaline Phosphatase: 49 U/L (ref 38–126)
Anion gap: 10 (ref 5–15)
BUN: 30 mg/dL — ABNORMAL HIGH (ref 6–20)
CO2: 20 mmol/L — ABNORMAL LOW (ref 22–32)
Calcium: 8.3 mg/dL — ABNORMAL LOW (ref 8.9–10.3)
Chloride: 105 mmol/L (ref 98–111)
Creatinine, Ser: 2.19 mg/dL — ABNORMAL HIGH (ref 0.44–1.00)
GFR, Estimated: 27 mL/min — ABNORMAL LOW (ref >60)
Glucose, Bld: 115 mg/dL — ABNORMAL HIGH (ref 70–99)
Potassium: 3.7 mmol/L (ref 3.5–5.1)
Sodium: 135 mmol/L (ref 135–145)
Total Bilirubin: 0.5 mg/dL (ref 0.3–1.2)
Total Protein: 6.6 g/dL (ref 6.5–8.1)

## 2022-03-23 LAB — GASTROINTESTINAL PANEL BY PCR, STOOL (REPLACES STOOL CULTURE)
Adenovirus F40/41: NOT DETECTED
Astrovirus: NOT DETECTED
Campylobacter species: NOT DETECTED
Cryptosporidium: NOT DETECTED
Cyclospora cayetanensis: NOT DETECTED
Entamoeba histolytica: NOT DETECTED
Enteroaggregative E coli (EAEC): DETECTED — AB
Enteropathogenic E coli (EPEC): NOT DETECTED
Enterotoxigenic E coli (ETEC): NOT DETECTED
Giardia lamblia: NOT DETECTED
Norovirus GI/GII: NOT DETECTED
Plesimonas shigelloides: NOT DETECTED
Rotavirus A: NOT DETECTED
Salmonella species: DETECTED — AB
Sapovirus (I, II, IV, and V): NOT DETECTED
Shiga like toxin producing E coli (STEC): NOT DETECTED
Shigella/Enteroinvasive E coli (EIEC): NOT DETECTED
Vibrio cholerae: NOT DETECTED
Vibrio species: NOT DETECTED
Yersinia enterocolitica: NOT DETECTED

## 2022-03-23 LAB — CBC
HCT: 32.6 % — ABNORMAL LOW (ref 36.0–46.0)
Hemoglobin: 10.5 g/dL — ABNORMAL LOW (ref 12.0–15.0)
MCH: 28.3 pg (ref 26.0–34.0)
MCHC: 32.2 g/dL (ref 30.0–36.0)
MCV: 87.9 fL (ref 80.0–100.0)
Platelets: 142 10*3/uL — ABNORMAL LOW (ref 150–400)
RBC: 3.71 MIL/uL — ABNORMAL LOW (ref 3.87–5.11)
RDW: 14.2 % (ref 11.5–15.5)
WBC: 5 10*3/uL (ref 4.0–10.5)
nRBC: 0 % (ref 0.0–0.2)

## 2022-03-23 LAB — HIV ANTIBODY (ROUTINE TESTING W REFLEX): HIV Screen 4th Generation wRfx: NONREACTIVE

## 2022-03-23 LAB — BASIC METABOLIC PANEL
Anion gap: 7 (ref 5–15)
BUN: 25 mg/dL — ABNORMAL HIGH (ref 6–20)
CO2: 22 mmol/L (ref 22–32)
Calcium: 8.1 mg/dL — ABNORMAL LOW (ref 8.9–10.3)
Chloride: 105 mmol/L (ref 98–111)
Creatinine, Ser: 1.76 mg/dL — ABNORMAL HIGH (ref 0.44–1.00)
GFR, Estimated: 35 mL/min — ABNORMAL LOW (ref >60)
Glucose, Bld: 121 mg/dL — ABNORMAL HIGH (ref 70–99)
Potassium: 3.5 mmol/L (ref 3.5–5.1)
Sodium: 134 mmol/L — ABNORMAL LOW (ref 135–145)

## 2022-03-23 LAB — GLUCOSE, CAPILLARY
Glucose-Capillary: 103 mg/dL — ABNORMAL HIGH (ref 70–99)
Glucose-Capillary: 154 mg/dL — ABNORMAL HIGH (ref 70–99)
Glucose-Capillary: 132 mg/dL — ABNORMAL HIGH (ref 70–99)
Glucose-Capillary: 142 mg/dL — ABNORMAL HIGH (ref 70–99)

## 2022-03-23 MED ORDER — SENNOSIDES-DOCUSATE SODIUM 8.6-50 MG PO TABS: 1.0000 | ORAL_TABLET | Freq: Every evening | ORAL | Status: DC | PRN

## 2022-03-23 MED ORDER — IPRATROPIUM-ALBUTEROL 0.5-2.5 (3) MG/3ML IN SOLN: 3.0000 mL | RESPIRATORY_TRACT | Status: DC | PRN

## 2022-03-23 MED ORDER — SODIUM CHLORIDE 0.9 % IV SOLN: INTRAVENOUS | Status: DC

## 2022-03-23 MED ORDER — ONDANSETRON HCL 4 MG/2ML IJ SOLN: 4.0000 mg | Freq: Four times a day (QID) | INTRAMUSCULAR | Status: DC | PRN

## 2022-03-23 MED ORDER — PANTOPRAZOLE SODIUM 40 MG PO TBEC
40.0000 mg | DELAYED_RELEASE_TABLET | Freq: Every day | ORAL | Status: DC
  Administered 2022-03-24 – 2022-03-25 (×2): 40 mg via ORAL
  Filled 2022-03-23 (×2): qty 1

## 2022-03-23 MED ORDER — AZITHROMYCIN 250 MG PO TABS
500.0000 mg | ORAL_TABLET | Freq: Every day | ORAL | Status: AC
  Administered 2022-03-23 – 2022-03-25 (×3): 500 mg via ORAL
  Filled 2022-03-23 (×3): qty 2

## 2022-03-23 MED ORDER — HYDRALAZINE HCL 20 MG/ML IJ SOLN: 10.0000 mg | INTRAMUSCULAR | Status: DC | PRN

## 2022-03-23 MED ORDER — ZINC OXIDE 40 % EX OINT
TOPICAL_OINTMENT | CUTANEOUS | Status: DC | PRN
  Filled 2022-03-23: qty 57

## 2022-03-23 MED ORDER — METOPROLOL TARTRATE 5 MG/5ML IV SOLN: 5.0000 mg | INTRAVENOUS | Status: DC | PRN

## 2022-03-23 MED ORDER — PRAVASTATIN SODIUM 20 MG PO TABS
20.0000 mg | ORAL_TABLET | Freq: Every day | ORAL | Status: DC
  Administered 2022-03-23 – 2022-03-24 (×2): 20 mg via ORAL
  Filled 2022-03-23 (×2): qty 1

## 2022-03-23 MED ORDER — GUAIFENESIN 100 MG/5ML PO LIQD: 5.0000 mL | ORAL | Status: DC | PRN

## 2022-03-23 NOTE — Progress Notes (Signed)
PROGRESS NOTE    Monica [name]  JJH:[id number] DOB: 1975/07/29 DOA: 03/22/2022 PCP: [place], Betty G, MD   Brief Narrative:  47 year old with history of GERD, DM 2, HTN presenting to the hospital with diarrhea and weakness.  CT abdomen pelvis did not show infectious versus inflammatory colitis surrounding cecum/ascending colon area.  There was also fullness appearing pancreatic tail and recommended nonemergent MRI.   Assessment & Plan:  Principal Problem:   Diarrhea Active Problems:   Hypertension, essential, benign   Type 2 diabetes mellitus with other specified complication (HCC)   Hyperlipidemia associated with type 2 diabetes mellitus (HCC)   Hypokalemia   Hyponatremia   Colitis   Tobacco abuse   AKI (acute kidney injury) ([place])   Nonspecific diarrhea with cecum/ascending colitis, uncomplicated - Patient C. difficile is negative but GI panel came back positive for enteroaggressive E. coli and Salmonella.  Given her severely of symptoms and CT scan finding, I discussed case with infectious disease Dr. Baxter Flattery.  For now we will continue IV Rocephin and anticipate total 10 days of this but eventually can be switched to oral fluoroquinolone.  Will also do 3 days of oral azithromycin.  Appreciate input from infectious disease   AKI -Baseline Cr 0.9. Admission creatinine 3.54, this morning 2.19 - IV fluids   Hypokalemia Hyponatremia -Replete and monitor electrolytes as needed   DM2 -Currently on sliding scale and Accu-Cheks   HLD -On statin at home   HTN -On hold due to soft blood pressure.  IV as needed ordered   Tobacco abuse -Counseled to quit using this    DVT prophylaxis: Lovenox Code Status: Full code Family Communication:    Status is: Inpatient Remains inpatient appropriate because: Continue hospital stay for severe diarrhea and acute kidney injury.     Subjective:  Patient seen and examined at bedside.  She has had about 20 episodes of diarrhea in last  24 hours.  No other complaints at this time  Examination:  General exam: Appears calm and comfortable  Respiratory system: Clear to auscultation. Respiratory effort normal. Cardiovascular system: S1 & S2 heard, RRR. No JVD, murmurs, rubs, gallops or clicks. No pedal edema. Gastrointestinal system: Abdomen is nondistended, soft and nontender. No organomegaly or masses felt. Normal bowel sounds heard. Central nervous system: Alert and oriented. No focal neurological deficits. Extremities: Symmetric 5 x 5 power. Skin: No rashes, lesions or ulcers Psychiatry: Judgement and insight appear normal. Mood & affect appropriate.     Objective: Vitals:   03/22/22 1816 03/22/22 2046 03/23/22 0125 03/23/22 0606  BP: 94/78 94/60 114/65 (!) 90/51  Pulse: 98 99 (!) 101 78  Resp: '16 20 20 18  '$ Temp: 98.5 F (36.9 C) 99 F (37.2 C) 98.1 F (36.7 C) 99.5 F (37.5 C)  TempSrc: Oral Oral Oral Oral  SpO2: 97% 94% 100% 99%  Weight:      Height:        Intake/Output Summary (Last 24 hours) at 03/23/2022 0814 Last data filed at 03/22/2022 1630 Gross per 24 hour  Intake 3200 ml  Output --  Net 3200 ml   Filed Weights   03/22/22 1330  Weight: (!) 142 kg     Data Reviewed:   CBC: Recent Labs  Lab 03/22/22 1139 03/23/22 0409  WBC 5.4 5.0  NEUTROABS 3.5  --   HGB 13.2 10.5*  HCT 39.5 32.6*  MCV 86.2 87.9  PLT 180 818*   Basic Metabolic Panel: Recent Labs  Lab 03/22/22 1139 03/23/22  0409  NA 132* 135  K 3.4* 3.7  CL 98 105  CO2 21* 20*  GLUCOSE 136* 115*  BUN 27* 30*  CREATININE 3.54* 2.19*  CALCIUM 8.8* 8.3*  MG 1.9  --    GFR: Estimated Creatinine Clearance: 49.9 mL/min (A) (by C-G formula based on SCr of 2.19 mg/dL (H)). Liver Function Tests: Recent Labs  Lab 03/22/22 1139 03/23/22 0409  AST 31 21  ALT 15 13  ALKPHOS 56 49  BILITOT 0.8 0.5  PROT 8.1 6.6  ALBUMIN 3.5 3.0*   Recent Labs  Lab 03/22/22 1139  LIPASE 32   No results for input(s): "AMMONIA" in the  last 168 hours. Coagulation Profile: No results for input(s): "INR", "PROTIME" in the last 168 hours. Cardiac Enzymes: No results for input(s): "CKTOTAL", "CKMB", "CKMBINDEX", "TROPONINI" in the last 168 hours. BNP (last 3 results) No results for input(s): "PROBNP" in the last 8760 hours. HbA1C: No results for input(s): "HGBA1C" in the last 72 hours. CBG: Recent Labs  Lab 03/22/22 1118 03/22/22 1838 03/22/22 2042  GLUCAP 131* 111* 93   Lipid Profile: No results for input(s): "CHOL", "HDL", "LDLCALC", "TRIG", "CHOLHDL", "LDLDIRECT" in the last 72 hours. Thyroid Function Tests: No results for input(s): "TSH", "T4TOTAL", "FREET4", "T3FREE", "THYROIDAB" in the last 72 hours. Anemia Panel: No results for input(s): "VITAMINB12", "FOLATE", "FERRITIN", "TIBC", "IRON", "RETICCTPCT" in the last 72 hours. Sepsis Labs: No results for input(s): "PROCALCITON", "LATICACIDVEN" in the last 168 hours.  Recent Results (from the past 240 hour(s))  C Difficile Quick Screen w PCR reflex     Status: None   Collection Time: 03/22/22 11:40 AM   Specimen: Stool  Result Value Ref Range Status   C Diff antigen NEGATIVE NEGATIVE Final   C Diff toxin NEGATIVE NEGATIVE Final   C Diff interpretation No C. difficile detected.  Final    Comment: Performed at [facility], [place] [address], [place], [place] [address]  Resp panel by RT-PCR (RSV, Flu A&B, Covid) Urine, Clean Catch     Status: None   Collection Time: 03/22/22  4:22 PM   Specimen: Urine, Clean Catch; Nasal Swab  Result Value Ref Range Status   SARS Coronavirus 2 by RT PCR NEGATIVE NEGATIVE Final    Comment: (NOTE) SARS-CoV-2 target nucleic acids are NOT DETECTED.  The SARS-CoV-2 RNA is generally detectable in upper respiratory specimens during the acute phase of infection. The lowest concentration of SARS-CoV-2 viral copies this assay can detect is 138 copies/mL. A negative result does not preclude SARS-Cov-2 infection and  should not be used as the sole basis for treatment or other patient management decisions. A negative result may occur with  improper specimen collection/handling, submission of specimen other than nasopharyngeal swab, presence of viral mutation(s) within the areas targeted by this assay, and inadequate number of viral copies(<138 copies/mL). A negative result must be combined with clinical observations, patient history, and epidemiological information. The expected result is Negative.  Fact Sheet for Patients:  EntrepreneurPulse.com.au  Fact Sheet for Healthcare Providers:  IncredibleEmployment.be  This test is no t yet approved or cleared by the [place] FDA and  has been authorized for detection and/or diagnosis of SARS-CoV-2 by FDA under an Emergency Use Authorization (EUA). This EUA will remain  in effect (meaning this test can be used) for the duration of the COVID-19 declaration under Section 564(b)(1) of the Act, 21 U.S.C.section 360bbb-3(b)(1), unless the authorization is terminated  or revoked sooner.  Influenza A by PCR NEGATIVE NEGATIVE Final   Influenza B by PCR NEGATIVE NEGATIVE Final    Comment: (NOTE) The Xpert Xpress SARS-CoV-2/FLU/RSV plus assay is intended as an aid in the diagnosis of influenza from Nasopharyngeal swab specimens and should not be used as a sole basis for treatment. Nasal washings and aspirates are unacceptable for Xpert Xpress SARS-CoV-2/FLU/RSV testing.  Fact Sheet for Patients: EntrepreneurPulse.com.au  Fact Sheet for Healthcare Providers: IncredibleEmployment.be  This test is not yet approved or cleared by the [place] FDA and has been authorized for detection and/or diagnosis of SARS-CoV-2 by FDA under an Emergency Use Authorization (EUA). This EUA will remain in effect (meaning this test can be used) for the duration of the COVID-19 declaration  under Section 564(b)(1) of the Act, 21 U.S.C. section 360bbb-3(b)(1), unless the authorization is terminated or revoked.     Resp Syncytial Virus by PCR NEGATIVE NEGATIVE Final    Comment: (NOTE) Fact Sheet for Patients: EntrepreneurPulse.com.au  Fact Sheet for Healthcare Providers: IncredibleEmployment.be  This test is not yet approved or cleared by the [place] FDA and has been authorized for detection and/or diagnosis of SARS-CoV-2 by FDA under an Emergency Use Authorization (EUA). This EUA will remain in effect (meaning this test can be used) for the duration of the COVID-19 declaration under Section 564(b)(1) of the Act, 21 U.S.C. section 360bbb-3(b)(1), unless the authorization is terminated or revoked.  Performed at [facility], [place] [address], [place], [place] [address]          Radiology Studies: CT ABDOMEN PELVIS WO CONTRAST  Result Date: 03/22/2022 CLINICAL DATA:  Diarrhea for 4 days. EXAM: CT ABDOMEN AND PELVIS WITHOUT CONTRAST TECHNIQUE: Multidetector CT imaging of the abdomen and pelvis was performed following the standard protocol without IV contrast. RADIATION DOSE REDUCTION: This exam was performed according to the departmental dose-optimization program which includes automated exposure control, adjustment of the mA and/or kV according to patient size and/or use of iterative reconstruction technique. COMPARISON:  None Available. FINDINGS: Lower chest: The lung bases are clear. The imaged heart is unremarkable. Hepatobiliary: The liver is hypoattenuating likely reflecting fatty infiltration. The gallbladder is unremarkable. There is no biliary ductal dilatation. Pancreas: There is 1.9 cm x 1.4 cm rounded lesion which appears to be in conjunction with the pancreatic tail (2-30, 6-103). There is no other focal lesion or pancreatic contour abnormality. There is no main pancreatic ductal dilatation or  peripancreatic inflammatory change. Spleen: Unremarkable. Adrenals/Urinary Tract: There is a 1.1 cm x 0.8 cm right adrenal nodule measuring 6 Hounsfield units likely reflecting a benign adenoma (5-73). The left adrenal is unremarkable. The kidneys are unremarkable, with no focal lesion, stone, hydronephrosis, or hydroureter. The bladder is decompressed but grossly unremarkable. Stomach/Bowel: The stomach is unremarkable. There is no evidence of bowel obstruction. There is fluid in the colon consistent with the provided history of diarrhea. There is mild inflammatory fat stranding around the cecum and ascending colon. There is no other bowel wall thickening or inflammatory change. Vascular/Lymphatic: The abdominal aorta is normal in course and caliber. There is no abdominopelvic lymphadenopathy. Reproductive: The uterus is surgically absent. There is no adnexal mass. Other: There is no ascites or free Musculoskeletal: Air. There is no acute osseous abnormality or suspicious osseous lesion. IMPRESSION: 1. Fluid in the colon consistent with diarrhea with mild inflammatory fat stranding surrounding the cecum/ascending colon likely reflecting mild infectious or inflammatory colitis. 2. Fullness of the pancreatic tail may reflect an intrapancreatic splenule; however  pancreatic neoplasm is not excluded. Recommend nonemergent outpatient MRI with and without contrast for further evaluation. 3. Probable benign right adrenal adenoma requiring no specific imaging follow-up. Electronically Signed   By: Valetta Mole M.D.   On: 03/22/2022 14:08        Scheduled Meds:  enoxaparin (LOVENOX) injection  70 mg Subcutaneous Q24H   insulin aspart  0-15 Units Subcutaneous TID WC   insulin aspart  0-5 Units Subcutaneous QHS   potassium chloride  40 mEq Oral Daily   Continuous Infusions:  sodium chloride 100 mL/hr at 03/23/22 1460   cefTRIAXone (ROCEPHIN)  IV     metronidazole 500 mg (03/22/22 2128)     LOS: 1 day   Time  spent= 35 mins    Janesa Dockery Arsenio Loader, MD [facility]  If 7PM-7AM, please contact night-coverage  03/23/2022, 8:14 AM

## 2022-03-23 NOTE — Progress Notes (Signed)
  Transition of Care [facility]) Screening Note   Patient Details  Name: GWENETTA DEVOS Date of Birth: [birth year]   Transition of Care [facility]) CM/SW Contact:    Dessa Phi, RN Phone Number: 03/23/2022, 12:16 PM    Transition of Care Department [employer]) has reviewed patient and no TOC needs have been identified at this time. We will continue to monitor patient advancement through interdisciplinary progression rounds. If new patient transition needs arise, please place a TOC consult.

## 2022-03-23 NOTE — Telephone Encounter (Signed)
Patient is currently admitted to the ED.

## 2022-03-24 DIAGNOSIS — R197 Diarrhea, unspecified: Secondary | ICD-10-CM | POA: Diagnosis not present

## 2022-03-24 LAB — GLUCOSE, CAPILLARY
Glucose-Capillary: 116 mg/dL — ABNORMAL HIGH (ref 70–99)
Glucose-Capillary: 121 mg/dL — ABNORMAL HIGH (ref 70–99)
Glucose-Capillary: 86 mg/dL (ref 70–99)
Glucose-Capillary: 121 mg/dL — ABNORMAL HIGH (ref 70–99)

## 2022-03-24 LAB — CBC
HCT: 28.2 % — ABNORMAL LOW (ref 36.0–46.0)
Hemoglobin: 9.3 g/dL — ABNORMAL LOW (ref 12.0–15.0)
MCH: 28.4 pg (ref 26.0–34.0)
MCHC: 33 g/dL (ref 30.0–36.0)
MCV: 86.2 fL (ref 80.0–100.0)
Platelets: 145 K/uL — ABNORMAL LOW (ref 150–400)
RBC: 3.27 MIL/uL — ABNORMAL LOW (ref 3.87–5.11)
RDW: 14.5 % (ref 11.5–15.5)
WBC: 5 K/uL (ref 4.0–10.5)
nRBC: 0 % (ref 0.0–0.2)

## 2022-03-24 LAB — BASIC METABOLIC PANEL WITH GFR
Anion gap: 7 (ref 5–15)
BUN: 15 mg/dL (ref 6–20)
CO2: 21 mmol/L — ABNORMAL LOW (ref 22–32)
Calcium: 8.1 mg/dL — ABNORMAL LOW (ref 8.9–10.3)
Chloride: 107 mmol/L (ref 98–111)
Creatinine, Ser: 1.21 mg/dL — ABNORMAL HIGH (ref 0.44–1.00)
GFR, Estimated: 55 mL/min — ABNORMAL LOW (ref >60)
Glucose, Bld: 99 mg/dL (ref 70–99)
Potassium: 3.5 mmol/L (ref 3.5–5.1)
Sodium: 135 mmol/L (ref 135–145)

## 2022-03-24 LAB — MAGNESIUM: Magnesium: 1.7 mg/dL (ref 1.7–2.4)

## 2022-03-24 MED ORDER — POTASSIUM CHLORIDE 10 MEQ/100ML IV SOLN
10.0000 meq | INTRAVENOUS | Status: AC
  Administered 2022-03-24 (×4): 10 meq via INTRAVENOUS
  Filled 2022-03-24 (×4): qty 100

## 2022-03-24 NOTE — Progress Notes (Signed)
PROGRESS NOTE    Monica GERKEN  [name]:[id number] DOB: [birth year] DOA: 03/22/2022 PCP: [place], Betty G, MD   Brief Narrative:  47 year old with history of GERD, DM 2, HTN presenting to the hospital with diarrhea and weakness.  CT abdomen pelvis did not show infectious versus inflammatory colitis surrounding cecum/ascending colon area.  There was also fullness appearing pancreatic tail and recommended nonemergent MRI.  Patient stool studies came back positive for EAEC and Salmonella.   Assessment & Plan:  Principal Problem:   Diarrhea Active Problems:   Hypertension, essential, benign   Type 2 diabetes mellitus with other specified complication (HCC)   Hyperlipidemia associated with type 2 diabetes mellitus (HCC)   Hypokalemia   Hyponatremia   Colitis   Tobacco abuse   AKI (acute kidney injury) ([place])   Nonspecific diarrhea with cecum/ascending colitis, uncomplicated - Patient C. difficile is negative but GI panel came back positive for enteroaggressive E. coli and Salmonella.  Given her severely of symptoms and CT scan finding, I discussed case with infectious disease Dr. Baxter Flattery.  As patient is improving well continue azithromycin for total of 3 days of Levaquin for total of 5 days.   AKI -Baseline Cr 0.9. Admission creatinine 3.54,  continues to improve, creatinine this morning 1.21.   Hypokalemia Hyponatremia -Replete and monitor electrolytes as needed   DM2 -Currently on sliding scale and Accu-Cheks   HLD -On statin at home   HTN -On hold due to soft blood pressure.  IV as needed ordered   Tobacco abuse -Counseled to quit using this    DVT prophylaxis: Lovenox Code Status: Full code Family Communication:    Status is: Inpatient Remains inpatient appropriate because: If she continues to make similar improvement over next 24 hours and able to tolerate p.o. she might build to go home.   Subjective: Patient seen and examined at bedside.  Tells me she had about total  of 8 episodes of diarrhea in last 24 hours which is significantly improved.  Her p.o. intake is slightly improved as well.  Examination:  Constitutional: Not in acute distress Respiratory: Clear to auscultation bilaterally Cardiovascular: Normal sinus rhythm, no rubs Abdomen: Nontender nondistended good bowel sounds Musculoskeletal: No edema noted Skin: No rashes seen Neurologic: CN 2-12 grossly intact.  And nonfocal Psychiatric: Normal judgment and insight. Alert and oriented x 3. Normal mood.  Objective: Vitals:   03/23/22 0606 03/23/22 1315 03/23/22 2030 03/24/22 0434  BP: (!) 90/51 (!) 91/56 (!) 104/55 103/62  Pulse: 78 82 79 80  Resp: '18 18 12 '$ (!) 21  Temp: 99.5 F (37.5 C) 98.1 F (36.7 C) 99.1 F (37.3 C) 98.6 F (37 C)  TempSrc: Oral Oral Oral Oral  SpO2: 99% 99% 100% 96%  Weight:      Height:        Intake/Output Summary (Last 24 hours) at 03/24/2022 1337 Last data filed at 03/24/2022 1100 Gross per 24 hour  Intake 2539.87 ml  Output --  Net 2539.87 ml   Filed Weights   03/22/22 1330  Weight: (!) 142 kg     Data Reviewed:   CBC: Recent Labs  Lab 03/22/22 1139 03/23/22 0409 03/24/22 0512  WBC 5.4 5.0 5.0  NEUTROABS 3.5  --   --   HGB 13.2 10.5* 9.3*  HCT 39.5 32.6* 28.2*  MCV 86.2 87.9 86.2  PLT 180 142* 440*   Basic Metabolic Panel: Recent Labs  Lab 03/22/22 1139 03/23/22 0409 03/23/22 1433 03/24/22 0512  NA 132* 135  134* 135  K 3.4* 3.7 3.5 3.5  CL 98 105 105 107  CO2 21* 20* 22 21*  GLUCOSE 136* 115* 121* 99  BUN 27* 30* 25* 15  CREATININE 3.54* 2.19* 1.76* 1.21*  CALCIUM 8.8* 8.3* 8.1* 8.1*  MG 1.9  --   --  1.7   GFR: Estimated Creatinine Clearance: 90.4 mL/min (A) (by C-G formula based on SCr of 1.21 mg/dL (H)). Liver Function Tests: Recent Labs  Lab 03/22/22 1139 03/23/22 0409  AST 31 21  ALT 15 13  ALKPHOS 56 49  BILITOT 0.8 0.5  PROT 8.1 6.6  ALBUMIN 3.5 3.0*   Recent Labs  Lab 03/22/22 1139  LIPASE 32   No  results for input(s): "AMMONIA" in the last 168 hours. Coagulation Profile: No results for input(s): "INR", "PROTIME" in the last 168 hours. Cardiac Enzymes: No results for input(s): "CKTOTAL", "CKMB", "CKMBINDEX", "TROPONINI" in the last 168 hours. BNP (last 3 results) No results for input(s): "PROBNP" in the last 8760 hours. HbA1C: No results for input(s): "HGBA1C" in the last 72 hours. CBG: Recent Labs  Lab 03/23/22 1201 03/23/22 1625 03/23/22 2032 03/24/22 0747 03/24/22 1139  GLUCAP 154* 132* 142* 116* 121*   Lipid Profile: No results for input(s): "CHOL", "HDL", "LDLCALC", "TRIG", "CHOLHDL", "LDLDIRECT" in the last 72 hours. Thyroid Function Tests: No results for input(s): "TSH", "T4TOTAL", "FREET4", "T3FREE", "THYROIDAB" in the last 72 hours. Anemia Panel: No results for input(s): "VITAMINB12", "FOLATE", "FERRITIN", "TIBC", "IRON", "RETICCTPCT" in the last 72 hours. Sepsis Labs: No results for input(s): "PROCALCITON", "LATICACIDVEN" in the last 168 hours.  Recent Results (from the past 240 hour(s))  Gastrointestinal Panel by PCR , Stool     Status: Abnormal   Collection Time: 03/22/22 11:40 AM   Specimen: Stool  Result Value Ref Range Status   Campylobacter species NOT DETECTED NOT DETECTED Final   Plesimonas shigelloides NOT DETECTED NOT DETECTED Final   Salmonella species DETECTED (A) NOT DETECTED Final    Comment: RESULT CALLED TO, READ BACK BY AND VERIFIED WITH: JENNIFER HUFF ON 03/23/22 AT 1049 QSD    Yersinia enterocolitica NOT DETECTED NOT DETECTED Final   Vibrio species NOT DETECTED NOT DETECTED Final   Vibrio cholerae NOT DETECTED NOT DETECTED Final   Enteroaggregative E coli (EAEC) DETECTED (A) NOT DETECTED Final    Comment: RESULT CALLED TO, READ BACK BY AND VERIFIED WITH: JENNIFER HUFF ON 03/23/22 AT 1049 QSD    Enteropathogenic E coli (EPEC) NOT DETECTED NOT DETECTED Final   Enterotoxigenic E coli (ETEC) NOT DETECTED NOT DETECTED Final   Shiga like toxin  producing E coli (STEC) NOT DETECTED NOT DETECTED Final   Shigella/Enteroinvasive E coli (EIEC) NOT DETECTED NOT DETECTED Final   Cryptosporidium NOT DETECTED NOT DETECTED Final   Cyclospora cayetanensis NOT DETECTED NOT DETECTED Final   Entamoeba histolytica NOT DETECTED NOT DETECTED Final   Giardia lamblia NOT DETECTED NOT DETECTED Final   Adenovirus F40/41 NOT DETECTED NOT DETECTED Final   Astrovirus NOT DETECTED NOT DETECTED Final   Norovirus GI/GII NOT DETECTED NOT DETECTED Final   Rotavirus A NOT DETECTED NOT DETECTED Final   Sapovirus (I, II, IV, and V) NOT DETECTED NOT DETECTED Final    Comment: Performed at [facility], [address] [place], [place] [address]  C Difficile Quick Screen w PCR reflex     Status: None   Collection Time: 03/22/22 11:40 AM   Specimen: Stool  Result Value Ref Range Status   C Diff  antigen NEGATIVE NEGATIVE Final   C Diff toxin NEGATIVE NEGATIVE Final   C Diff interpretation No C. difficile detected.  Final    Comment: Performed at [facility], [place] [address] [place], [place] [address]  Resp panel by RT-PCR (RSV, Flu A&B, Covid) Urine, Clean Catch     Status: None   Collection Time: 03/22/22  4:22 PM   Specimen: Urine, Clean Catch; Nasal Swab  Result Value Ref Range Status   SARS Coronavirus 2 by RT PCR NEGATIVE NEGATIVE Final    Comment: (NOTE) SARS-CoV-2 target nucleic acids are NOT DETECTED.  The SARS-CoV-2 RNA is generally detectable in upper respiratory specimens during the acute phase of infection. The lowest concentration of SARS-CoV-2 viral copies this assay can detect is 138 copies/mL. A negative result does not preclude SARS-Cov-2 infection and should not be used as the sole basis for treatment or other patient management decisions. A negative result may occur with  improper specimen collection/handling, submission of specimen other than nasopharyngeal swab, presence of viral mutation(s) within  the areas targeted by this assay, and inadequate number of viral copies(<138 copies/mL). A negative result must be combined with clinical observations, patient history, and epidemiological information. The expected result is Negative.  Fact Sheet for Patients:  EntrepreneurPulse.com.au  Fact Sheet for Healthcare Providers:  IncredibleEmployment.be  This test is no t yet approved or cleared by the [place] FDA and  has been authorized for detection and/or diagnosis of SARS-CoV-2 by FDA under an Emergency Use Authorization (EUA). This EUA will remain  in effect (meaning this test can be used) for the duration of the COVID-19 declaration under Section 564(b)(1) of the Act, 21 U.S.C.section 360bbb-3(b)(1), unless the authorization is terminated  or revoked sooner.       Influenza A by PCR NEGATIVE NEGATIVE Final   Influenza B by PCR NEGATIVE NEGATIVE Final    Comment: (NOTE) The Xpert Xpress SARS-CoV-2/FLU/RSV plus assay is intended as an aid in the diagnosis of influenza from Nasopharyngeal swab specimens and should not be used as a sole basis for treatment. Nasal washings and aspirates are unacceptable for Xpert Xpress SARS-CoV-2/FLU/RSV testing.  Fact Sheet for Patients: EntrepreneurPulse.com.au  Fact Sheet for Healthcare Providers: IncredibleEmployment.be  This test is not yet approved or cleared by the [place] FDA and has been authorized for detection and/or diagnosis of SARS-CoV-2 by FDA under an Emergency Use Authorization (EUA). This EUA will remain in effect (meaning this test can be used) for the duration of the COVID-19 declaration under Section 564(b)(1) of the Act, 21 U.S.C. section 360bbb-3(b)(1), unless the authorization is terminated or revoked.     Resp Syncytial Virus by PCR NEGATIVE NEGATIVE Final    Comment: (NOTE) Fact Sheet for  Patients: EntrepreneurPulse.com.au  Fact Sheet for Healthcare Providers: IncredibleEmployment.be  This test is not yet approved or cleared by the [place] FDA and has been authorized for detection and/or diagnosis of SARS-CoV-2 by FDA under an Emergency Use Authorization (EUA). This EUA will remain in effect (meaning this test can be used) for the duration of the COVID-19 declaration under Section 564(b)(1) of the Act, 21 U.S.C. section 360bbb-3(b)(1), unless the authorization is terminated or revoked.  Performed at [facility], [place] [address] [place], [place] [address]          Radiology Studies: CT ABDOMEN PELVIS WO CONTRAST  Result Date: 03/22/2022 CLINICAL DATA:  Diarrhea for 4 days. EXAM: CT ABDOMEN AND PELVIS WITHOUT CONTRAST TECHNIQUE: Multidetector CT imaging  of the abdomen and pelvis was performed following the standard protocol without IV contrast. RADIATION DOSE REDUCTION: This exam was performed according to the departmental dose-optimization program which includes automated exposure control, adjustment of the mA and/or kV according to patient size and/or use of iterative reconstruction technique. COMPARISON:  None Available. FINDINGS: Lower chest: The lung bases are clear. The imaged heart is unremarkable. Hepatobiliary: The liver is hypoattenuating likely reflecting fatty infiltration. The gallbladder is unremarkable. There is no biliary ductal dilatation. Pancreas: There is 1.9 cm x 1.4 cm rounded lesion which appears to be in conjunction with the pancreatic tail (2-30, 6-103). There is no other focal lesion or pancreatic contour abnormality. There is no main pancreatic ductal dilatation or peripancreatic inflammatory change. Spleen: Unremarkable. Adrenals/Urinary Tract: There is a 1.1 cm x 0.8 cm right adrenal nodule measuring 6 Hounsfield units likely reflecting a benign adenoma (5-73). The left adrenal is  unremarkable. The kidneys are unremarkable, with no focal lesion, stone, hydronephrosis, or hydroureter. The bladder is decompressed but grossly unremarkable. Stomach/Bowel: The stomach is unremarkable. There is no evidence of bowel obstruction. There is fluid in the colon consistent with the provided history of diarrhea. There is mild inflammatory fat stranding around the cecum and ascending colon. There is no other bowel wall thickening or inflammatory change. Vascular/Lymphatic: The abdominal aorta is normal in course and caliber. There is no abdominopelvic lymphadenopathy. Reproductive: The uterus is surgically absent. There is no adnexal mass. Other: There is no ascites or free Musculoskeletal: Air. There is no acute osseous abnormality or suspicious osseous lesion. IMPRESSION: 1. Fluid in the colon consistent with diarrhea with mild inflammatory fat stranding surrounding the cecum/ascending colon likely reflecting mild infectious or inflammatory colitis. 2. Fullness of the pancreatic tail may reflect an intrapancreatic splenule; however pancreatic neoplasm is not excluded. Recommend nonemergent outpatient MRI with and without contrast for further evaluation. 3. Probable benign right adrenal adenoma requiring no specific imaging follow-up. Electronically Signed   By: Valetta Mole M.D.   On: 03/22/2022 14:08        Scheduled Meds:  azithromycin  500 mg Oral Daily   enoxaparin (LOVENOX) injection  70 mg Subcutaneous Q24H   insulin aspart  0-15 Units Subcutaneous TID WC   insulin aspart  0-5 Units Subcutaneous QHS   pantoprazole  40 mg Oral Daily   potassium chloride  40 mEq Oral Daily   pravastatin  20 mg Oral q1800   Continuous Infusions:  sodium chloride 125 mL/hr at 03/24/22 0659   cefTRIAXone (ROCEPHIN)  IV 2 g (03/24/22 0905)   potassium chloride 10 mEq (03/24/22 1307)     LOS: 2 days   Time spent= 35 mins    Alix Lahmann Arsenio Loader, MD [facility]  If 7PM-7AM, please contact  night-coverage  03/24/2022, 1:37 PM

## 2022-03-25 ENCOUNTER — Encounter: Payer: Self-pay | Admitting: Internal Medicine

## 2022-03-25 DIAGNOSIS — R197 Diarrhea, unspecified: Secondary | ICD-10-CM | POA: Diagnosis not present

## 2022-03-25 LAB — CBC
HCT: 28.6 % — ABNORMAL LOW (ref 36.0–46.0)
Hemoglobin: 9.1 g/dL — ABNORMAL LOW (ref 12.0–15.0)
MCH: 28.1 pg (ref 26.0–34.0)
MCHC: 31.8 g/dL (ref 30.0–36.0)
MCV: 88.3 fL (ref 80.0–100.0)
Platelets: 149 10*3/uL — ABNORMAL LOW (ref 150–400)
RBC: 3.24 MIL/uL — ABNORMAL LOW (ref 3.87–5.11)
RDW: 14.6 % (ref 11.5–15.5)
WBC: 5 10*3/uL (ref 4.0–10.5)
nRBC: 0 % (ref 0.0–0.2)

## 2022-03-25 LAB — BASIC METABOLIC PANEL
Anion gap: 6 (ref 5–15)
BUN: 11 mg/dL (ref 6–20)
CO2: 22 mmol/L (ref 22–32)
Calcium: 8.1 mg/dL — ABNORMAL LOW (ref 8.9–10.3)
Chloride: 109 mmol/L (ref 98–111)
Creatinine, Ser: 1.06 mg/dL — ABNORMAL HIGH (ref 0.44–1.00)
GFR, Estimated: >60 mL/min (ref >60)
Glucose, Bld: 101 mg/dL — ABNORMAL HIGH (ref 70–99)
Potassium: 4.1 mmol/L (ref 3.5–5.1)
Sodium: 137 mmol/L (ref 135–145)

## 2022-03-25 LAB — MAGNESIUM: Magnesium: 1.8 mg/dL (ref 1.7–2.4)

## 2022-03-25 LAB — GLUCOSE, CAPILLARY: Glucose-Capillary: 102 mg/dL — ABNORMAL HIGH (ref 70–99)

## 2022-03-25 MED ORDER — AZITHROMYCIN 500 MG PO TABS: 500.0000 mg | ORAL_TABLET | Freq: Every day | ORAL | 0 refills | Status: AC

## 2022-03-25 MED ORDER — LEVOFLOXACIN 750 MG PO TABS: 750.0000 mg | ORAL_TABLET | Freq: Every day | ORAL | 0 refills | Status: AC

## 2022-03-25 NOTE — Discharge Summary (Signed)
Physician Discharge Summary  Monica [name] GYI:[id number] DOB: 1975-08-06 DOA: 03/22/2022  PCP: [place], Betty G, MD  Admit date: 03/22/2022 Discharge date: 03/25/2022  Admitted From: Home Disposition: Home  Recommendations for Outpatient Follow-up:  Follow up with PCP in 1-2 weeks Please obtain BMP/CBC in one week your next doctors visit.  3 days of p.o. azithromycin, 5 days of p.o. Levaquin Advised oral hydration   Discharge Condition: Stable CODE STATUS: Full code Diet recommendation: Diabetic  Brief/Interim Summary: 47 year old with history of GERD, DM 2, HTN presenting to the hospital with diarrhea and weakness.  CT abdomen pelvis did not show infectious versus inflammatory colitis surrounding cecum/ascending colon area.  There was also fullness appearing pancreatic tail and recommended nonemergent MRI.  Patient stool studies came back positive for EAEC and Salmonella.  Case was discussed with infectious disease, recommended azithromycin and Levaquin. Her diarrhea has significantly improved and now they are more formed stools.  No other complaints.  Wants to go home.  Will discharge her today in stable condition.     Assessment & Plan:  Principal Problem:   Diarrhea Active Problems:   Hypertension, essential, benign   Type 2 diabetes mellitus with other specified complication (HCC)   Hyperlipidemia associated with type 2 diabetes mellitus (HCC)   Hypokalemia   Hyponatremia   Colitis   Tobacco abuse   AKI (acute kidney injury) ([place])   Nonspecific diarrhea with cecum/ascending colitis, uncomplicated, significantly improved - Patient C. difficile is negative but GI panel came back positive for enteroaggressive E. coli and Salmonella.  Given her severely of symptoms and CT scan finding, I discussed case with infectious disease Dr. Baxter Flattery.  Will discharge the patient on 3 days of azithromycin and 5 days of p.o. Levaquin.   AKI, resolved -Baseline Cr 0.9. Admission creatinine 3.54,   continues to improve, creatinine this morning 1.06   Hypokalemia Hyponatremia -Replete and monitor electrolytes as needed   DM2 - Resume home regimen   HLD -On statin at home   HTN -On hold due to soft blood pressure.  IV as needed ordered   Tobacco abuse -Counseled to quit using this    Consultations: Curbside infectious disease, Dr. Baxter Flattery  Subjective: Seen and examined at bedside.  Tells me her diarrhea has significantly improved this morning and her stools are more formed.  Denies any nausea, vomiting.  She wishes to go home.  Discharge Exam: Vitals:   03/24/22 2215 03/25/22 0529  BP: 127/75 130/83  Pulse: 76 72  Resp: 20 18  Temp: 98.2 F (36.8 C) 98.6 F (37 C)  SpO2: 100% 97%   Vitals:   03/24/22 0434 03/24/22 1503 03/24/22 2215 03/25/22 0529  BP: 103/62 115/64 127/75 130/83  Pulse: 80 78 76 72  Resp: (!) '21 18 20 18  '$ Temp: 98.6 F (37 C) 98.2 F (36.8 C) 98.2 F (36.8 C) 98.6 F (37 C)  TempSrc: Oral Oral Oral Oral  SpO2: 96% 100% 100% 97%  Weight:      Height:        General: Pt is alert, awake, not in acute distress Cardiovascular: RRR, S1/S2 +, no rubs, no gallops Respiratory: CTA bilaterally, no wheezing, no rhonchi Abdominal: Soft, NT, ND, bowel sounds + Extremities: no edema, no cyanosis  Discharge Instructions   Allergies as of 03/25/2022       Reactions   Codeine Other (See Comments)   Unable to Urinate        Medication List     STOP  taking these medications    meloxicam 15 MG tablet Commonly known as: MOBIC       TAKE these medications    acetaminophen 650 MG CR tablet Commonly known as: TYLENOL Take 1,300 mg by mouth 2 (two) times daily as needed for pain.   albuterol 108 (90 Base) MCG/ACT inhaler Commonly known as: VENTOLIN HFA INHALE 2 PUFFS INTO THE LUNGS EVERY 6 HOURS AS NEEDED FOR WHEEZING OR SHORTNESS OF BREATH What changed: reasons to take this   amLODipine 2.5 MG tablet Commonly known as:  NORVASC Take 1 tablet (2.5 mg total) by mouth daily.   ammonium lactate 12 % cream Commonly known as: AMLACTIN Apply topically as needed for dry skin.   azithromycin 500 MG tablet Commonly known as: Zithromax Take 1 tablet (500 mg total) by mouth daily for 3 days. Take 1 tablet daily for 3 days.   Bayer Aspirin EC Low Dose 81 MG tablet Generic drug: aspirin EC Take 81 mg by mouth in the morning. Swallow whole.   ibuprofen 200 MG tablet Commonly known as: ADVIL Take 400 mg by mouth every 6 (six) hours as needed for mild pain or headache.   ketoconazole 2 % cream Commonly known as: NIZORAL Apply to both feet and between toes once daily for 6 weeks.   levocetirizine 5 MG tablet Commonly known as: XYZAL TAKE 1 TABLET(5 MG) BY MOUTH EVERY EVENING What changed: See the new instructions.   levofloxacin 750 MG tablet Commonly known as: Levaquin Take 1 tablet (750 mg total) by mouth daily for 5 days.   lovastatin 20 MG tablet Commonly known as: MEVACOR TAKE 1 TABLET(20 MG) BY MOUTH AT BEDTIME What changed: See the new instructions.   mometasone 50 MCG/ACT nasal spray Commonly known as: NASONEX SHAKE LIQUID AND USE 2 SPRAYS IN EACH NOSTRIL DAILY What changed: See the new instructions.   montelukast 10 MG tablet Commonly known as: SINGULAIR TAKE 1 TABLET(10 MG) BY MOUTH AT BEDTIME What changed: See the new instructions.   olmesartan-hydrochlorothiazide 40-25 MG tablet Commonly known as: BENICAR HCT Take 1 tablet by mouth daily.   omeprazole 40 MG capsule Commonly known as: PRILOSEC TAKE 1 CAPSULE(40 MG) BY MOUTH DAILY What changed: See the new instructions.   OneTouch Delica Plus [id number] Misc USE TO TEST BLOOD SUGAR EVERYDAY   OneTouch Verio test strip Generic drug: glucose blood USE TO TEST BLOOD SUGAR EVERY DAY   phenylephrine 10 MG Tabs tablet Commonly known as: SUDAFED PE Take 10 mg by mouth every 4 (four) hours as needed (WHEN FLYING, TO PREVENT  ALTITUDE-RELATED EAR PAIN).   PreviDent 0.2 % Soln Generic drug: SODIUM FLUORIDE (DENTAL RINSE) 5 mLs by Mouth Rinse route 2 (two) times daily.   repaglinide 0.5 MG tablet Commonly known as: PRANDIN Take 1 tablet (0.5 mg total) by mouth 2 (two) times daily before a meal.   tirzepatide 5 MG/0.5ML Pen Commonly known as: MOUNJARO Inject 5 mg into the skin once a week. What changed: when to take this   Wixela Inhub 250-50 MCG/ACT Aepb Generic drug: fluticasone-salmeterol INHALE 1 PUFF INTO THE LUNGS TWICE DAILY        Follow-up Information     Martinique, Betty G, MD Follow up in 1 week(s).   Specialty: Family Medicine Contact information: [address] [place] [address] [phone]                Allergies  Allergen Reactions   Codeine Other (See Comments)  Unable to Urinate     You were cared for by a hospitalist during your hospital stay. If you have any questions about your discharge medications or the care you received while you were in the hospital after you are discharged, you can call the unit and asked to speak with the hospitalist on call if the hospitalist that took care of you is not available. Once you are discharged, your primary care physician will handle any further medical issues. Please note that no refills for any discharge medications will be authorized once you are discharged, as it is imperative that you return to your primary care physician (or establish a relationship with a primary care physician if you do not have one) for your aftercare needs so that they can reassess your need for medications and monitor your lab values.   Procedures/Studies: CT ABDOMEN PELVIS WO CONTRAST  Result Date: 03/22/2022 CLINICAL DATA:  Diarrhea for 4 days. EXAM: CT ABDOMEN AND PELVIS WITHOUT CONTRAST TECHNIQUE: Multidetector CT imaging of the abdomen and pelvis was performed following the standard protocol without IV contrast. RADIATION DOSE REDUCTION:  This exam was performed according to the departmental dose-optimization program which includes automated exposure control, adjustment of the mA and/or kV according to patient size and/or use of iterative reconstruction technique. COMPARISON:  None Available. FINDINGS: Lower chest: The lung bases are clear. The imaged heart is unremarkable. Hepatobiliary: The liver is hypoattenuating likely reflecting fatty infiltration. The gallbladder is unremarkable. There is no biliary ductal dilatation. Pancreas: There is 1.9 cm x 1.4 cm rounded lesion which appears to be in conjunction with the pancreatic tail (2-30, 6-103). There is no other focal lesion or pancreatic contour abnormality. There is no main pancreatic ductal dilatation or peripancreatic inflammatory change. Spleen: Unremarkable. Adrenals/Urinary Tract: There is a 1.1 cm x 0.8 cm right adrenal nodule measuring 6 Hounsfield units likely reflecting a benign adenoma (5-73). The left adrenal is unremarkable. The kidneys are unremarkable, with no focal lesion, stone, hydronephrosis, or hydroureter. The bladder is decompressed but grossly unremarkable. Stomach/Bowel: The stomach is unremarkable. There is no evidence of bowel obstruction. There is fluid in the colon consistent with the provided history of diarrhea. There is mild inflammatory fat stranding around the cecum and ascending colon. There is no other bowel wall thickening or inflammatory change. Vascular/Lymphatic: The abdominal aorta is normal in course and caliber. There is no abdominopelvic lymphadenopathy. Reproductive: The uterus is surgically absent. There is no adnexal mass. Other: There is no ascites or free Musculoskeletal: Air. There is no acute osseous abnormality or suspicious osseous lesion. IMPRESSION: 1. Fluid in the colon consistent with diarrhea with mild inflammatory fat stranding surrounding the cecum/ascending colon likely reflecting mild infectious or inflammatory colitis. 2. Fullness of  the pancreatic tail may reflect an intrapancreatic splenule; however pancreatic neoplasm is not excluded. Recommend nonemergent outpatient MRI with and without contrast for further evaluation. 3. Probable benign right adrenal adenoma requiring no specific imaging follow-up. Electronically Signed   By: Valetta Mole M.D.   On: 03/22/2022 14:08     The results of significant diagnostics from this hospitalization (including imaging, microbiology, ancillary and laboratory) are listed below for reference.     Microbiology: Recent Results (from the past 240 hour(s))  Gastrointestinal Panel by PCR , Stool     Status: Abnormal   Collection Time: 03/22/22 11:40 AM   Specimen: Stool  Result Value Ref Range Status   Campylobacter species NOT DETECTED NOT DETECTED Final   Plesimonas shigelloides NOT DETECTED  NOT DETECTED Final   Salmonella species DETECTED (A) NOT DETECTED Final    Comment: RESULT CALLED TO, READ BACK BY AND VERIFIED WITH: JENNIFER HUFF ON 03/23/22 AT 1049 QSD    Yersinia enterocolitica NOT DETECTED NOT DETECTED Final   Vibrio species NOT DETECTED NOT DETECTED Final   Vibrio cholerae NOT DETECTED NOT DETECTED Final   Enteroaggregative E coli (EAEC) DETECTED (A) NOT DETECTED Final    Comment: RESULT CALLED TO, READ BACK BY AND VERIFIED WITH: JENNIFER HUFF ON 03/23/22 AT 1049 QSD    Enteropathogenic E coli (EPEC) NOT DETECTED NOT DETECTED Final   Enterotoxigenic E coli (ETEC) NOT DETECTED NOT DETECTED Final   Shiga like toxin producing E coli (STEC) NOT DETECTED NOT DETECTED Final   Shigella/Enteroinvasive E coli (EIEC) NOT DETECTED NOT DETECTED Final   Cryptosporidium NOT DETECTED NOT DETECTED Final   Cyclospora cayetanensis NOT DETECTED NOT DETECTED Final   Entamoeba histolytica NOT DETECTED NOT DETECTED Final   Giardia lamblia NOT DETECTED NOT DETECTED Final   Adenovirus F40/41 NOT DETECTED NOT DETECTED Final   Astrovirus NOT DETECTED NOT DETECTED Final   Norovirus GI/GII NOT  DETECTED NOT DETECTED Final   Rotavirus A NOT DETECTED NOT DETECTED Final   Sapovirus (I, II, IV, and V) NOT DETECTED NOT DETECTED Final    Comment: Performed at [facility], [place] [place], [place] [address]  C Difficile Quick Screen w PCR reflex     Status: None   Collection Time: 03/22/22 11:40 AM   Specimen: Stool  Result Value Ref Range Status   C Diff antigen NEGATIVE NEGATIVE Final   C Diff toxin NEGATIVE NEGATIVE Final   C Diff interpretation No C. difficile detected.  Final    Comment: Performed at [facility], [place] [address] [place], [place] [address]  Resp panel by RT-PCR (RSV, Flu A&B, Covid) Urine, Clean Catch     Status: None   Collection Time: 03/22/22  4:22 PM   Specimen: Urine, Clean Catch; Nasal Swab  Result Value Ref Range Status   SARS Coronavirus 2 by RT PCR NEGATIVE NEGATIVE Final    Comment: (NOTE) SARS-CoV-2 target nucleic acids are NOT DETECTED.  The SARS-CoV-2 RNA is generally detectable in upper respiratory specimens during the acute phase of infection. The lowest concentration of SARS-CoV-2 viral copies this assay can detect is 138 copies/mL. A negative result does not preclude SARS-Cov-2 infection and should not be used as the sole basis for treatment or other patient management decisions. A negative result may occur with  improper specimen collection/handling, submission of specimen other than nasopharyngeal swab, presence of viral mutation(s) within the areas targeted by this assay, and inadequate number of viral copies(<138 copies/mL). A negative result must be combined with clinical observations, patient history, and epidemiological information. The expected result is Negative.  Fact Sheet for Patients:  EntrepreneurPulse.com.au  Fact Sheet for Healthcare Providers:  IncredibleEmployment.be  This test is no t yet approved or cleared by the [place] FDA and  has  been authorized for detection and/or diagnosis of SARS-CoV-2 by FDA under an Emergency Use Authorization (EUA). This EUA will remain  in effect (meaning this test can be used) for the duration of the COVID-19 declaration under Section 564(b)(1) of the Act, 21 U.S.C.section 360bbb-3(b)(1), unless the authorization is terminated  or revoked sooner.       Influenza A by PCR NEGATIVE NEGATIVE Final   Influenza B by PCR NEGATIVE NEGATIVE Final    Comment: (  NOTE) The Xpert Xpress SARS-CoV-2/FLU/RSV plus assay is intended as an aid in the diagnosis of influenza from Nasopharyngeal swab specimens and should not be used as a sole basis for treatment. Nasal washings and aspirates are unacceptable for Xpert Xpress SARS-CoV-2/FLU/RSV testing.  Fact Sheet for Patients: EntrepreneurPulse.com.au  Fact Sheet for Healthcare Providers: IncredibleEmployment.be  This test is not yet approved or cleared by the [place] FDA and has been authorized for detection and/or diagnosis of SARS-CoV-2 by FDA under an Emergency Use Authorization (EUA). This EUA will remain in effect (meaning this test can be used) for the duration of the COVID-19 declaration under Section 564(b)(1) of the Act, 21 U.S.C. section 360bbb-3(b)(1), unless the authorization is terminated or revoked.     Resp Syncytial Virus by PCR NEGATIVE NEGATIVE Final    Comment: (NOTE) Fact Sheet for Patients: EntrepreneurPulse.com.au  Fact Sheet for Healthcare Providers: IncredibleEmployment.be  This test is not yet approved or cleared by the [place] FDA and has been authorized for detection and/or diagnosis of SARS-CoV-2 by FDA under an Emergency Use Authorization (EUA). This EUA will remain in effect (meaning this test can be used) for the duration of the COVID-19 declaration under Section 564(b)(1) of the Act, 21 U.S.C. section 360bbb-3(b)(1), unless the  authorization is terminated or revoked.  Performed at [facility], [place] [address], [place], [place] [address]      Labs: BNP (last 3 results) No results for input(s): "BNP" in the last 8760 hours. Basic Metabolic Panel: Recent Labs  Lab 03/22/22 1139 03/23/22 0409 03/23/22 1433 03/24/22 0512 03/25/22 0435  NA 132* 135 134* 135 137  K 3.4* 3.7 3.5 3.5 4.1  CL 98 105 105 107 109  CO2 21* 20* 22 21* 22  GLUCOSE 136* 115* 121* 99 101*  BUN 27* 30* 25* 15 11  CREATININE 3.54* 2.19* 1.76* 1.21* 1.06*  CALCIUM 8.8* 8.3* 8.1* 8.1* 8.1*  MG 1.9  --   --  1.7 1.8   Liver Function Tests: Recent Labs  Lab 03/22/22 1139 03/23/22 0409  AST 31 21  ALT 15 13  ALKPHOS 56 49  BILITOT 0.8 0.5  PROT 8.1 6.6  ALBUMIN 3.5 3.0*   Recent Labs  Lab 03/22/22 1139  LIPASE 32   No results for input(s): "AMMONIA" in the last 168 hours. CBC: Recent Labs  Lab 03/22/22 1139 03/23/22 0409 03/24/22 0512 03/25/22 0435  WBC 5.4 5.0 5.0 5.0  NEUTROABS 3.5  --   --   --   HGB 13.2 10.5* 9.3* 9.1*  HCT 39.5 32.6* 28.2* 28.6*  MCV 86.2 87.9 86.2 88.3  PLT 180 142* 145* 149*   Cardiac Enzymes: No results for input(s): "CKTOTAL", "CKMB", "CKMBINDEX", "TROPONINI" in the last 168 hours. BNP: Invalid input(s): "POCBNP" CBG: Recent Labs  Lab 03/24/22 0747 03/24/22 1139 03/24/22 1704 03/24/22 2212 03/25/22 0801  GLUCAP 116* 121* 86 121* 102*   D-Dimer No results for input(s): "DDIMER" in the last 72 hours. Hgb A1c No results for input(s): "HGBA1C" in the last 72 hours. Lipid Profile No results for input(s): "CHOL", "HDL", "LDLCALC", "TRIG", "CHOLHDL", "LDLDIRECT" in the last 72 hours. Thyroid function studies No results for input(s): "TSH", "T4TOTAL", "T3FREE", "THYROIDAB" in the last 72 hours.  Invalid input(s): "FREET3" Anemia work up No results for input(s): "VITAMINB12", "FOLATE", "FERRITIN", "TIBC", "IRON", "RETICCTPCT" in the last 72  hours. Urinalysis    Component Value Date/Time   COLORURINE YELLOW 03/22/2022 1139   APPEARANCEUR CLOUDY (A) 03/22/2022 1139  LABSPEC 1.016 03/22/2022 1139   PHURINE 5.0 03/22/2022 1139   GLUCOSEU NEGATIVE 03/22/2022 1139   HGBUR NEGATIVE 03/22/2022 1139   BILIRUBINUR NEGATIVE 03/22/2022 1139   KETONESUR 5 (A) 03/22/2022 1139   PROTEINUR 100 (A) 03/22/2022 1139   NITRITE NEGATIVE 03/22/2022 1139   LEUKOCYTESUR NEGATIVE 03/22/2022 1139   Sepsis Labs Recent Labs  Lab 03/22/22 1139 03/23/22 0409 03/24/22 0512 03/25/22 0435  WBC 5.4 5.0 5.0 5.0   Microbiology Recent Results (from the past 240 hour(s))  Gastrointestinal Panel by PCR , Stool     Status: Abnormal   Collection Time: 03/22/22 11:40 AM   Specimen: Stool  Result Value Ref Range Status   Campylobacter species NOT DETECTED NOT DETECTED Final   Plesimonas shigelloides NOT DETECTED NOT DETECTED Final   Salmonella species DETECTED (A) NOT DETECTED Final    Comment: RESULT CALLED TO, READ BACK BY AND VERIFIED WITH: JENNIFER HUFF ON 03/23/22 AT 1049 QSD    Yersinia enterocolitica NOT DETECTED NOT DETECTED Final   Vibrio species NOT DETECTED NOT DETECTED Final   Vibrio cholerae NOT DETECTED NOT DETECTED Final   Enteroaggregative E coli (EAEC) DETECTED (A) NOT DETECTED Final    Comment: RESULT CALLED TO, READ BACK BY AND VERIFIED WITH: JENNIFER HUFF ON 03/23/22 AT 1049 QSD    Enteropathogenic E coli (EPEC) NOT DETECTED NOT DETECTED Final   Enterotoxigenic E coli (ETEC) NOT DETECTED NOT DETECTED Final   Shiga like toxin producing E coli (STEC) NOT DETECTED NOT DETECTED Final   Shigella/Enteroinvasive E coli (EIEC) NOT DETECTED NOT DETECTED Final   Cryptosporidium NOT DETECTED NOT DETECTED Final   Cyclospora cayetanensis NOT DETECTED NOT DETECTED Final   Entamoeba histolytica NOT DETECTED NOT DETECTED Final   Giardia lamblia NOT DETECTED NOT DETECTED Final   Adenovirus F40/41 NOT DETECTED NOT DETECTED Final   Astrovirus  NOT DETECTED NOT DETECTED Final   Norovirus GI/GII NOT DETECTED NOT DETECTED Final   Rotavirus A NOT DETECTED NOT DETECTED Final   Sapovirus (I, II, IV, and V) NOT DETECTED NOT DETECTED Final    Comment: Performed at [facility], [place] [place], [place] [address]  C Difficile Quick Screen w PCR reflex     Status: None   Collection Time: 03/22/22 11:40 AM   Specimen: Stool  Result Value Ref Range Status   C Diff antigen NEGATIVE NEGATIVE Final   C Diff toxin NEGATIVE NEGATIVE Final   C Diff interpretation No C. difficile detected.  Final    Comment: Performed at [facility], [place] [address], [place], [place] [address]  Resp panel by RT-PCR (RSV, Flu A&B, Covid) Urine, Clean Catch     Status: None   Collection Time: 03/22/22  4:22 PM   Specimen: Urine, Clean Catch; Nasal Swab  Result Value Ref Range Status   SARS Coronavirus 2 by RT PCR NEGATIVE NEGATIVE Final    Comment: (NOTE) SARS-CoV-2 target nucleic acids are NOT DETECTED.  The SARS-CoV-2 RNA is generally detectable in upper respiratory specimens during the acute phase of infection. The lowest concentration of SARS-CoV-2 viral copies this assay can detect is 138 copies/mL. A negative result does not preclude SARS-Cov-2 infection and should not be used as the sole basis for treatment or other patient management decisions. A negative result may occur with  improper specimen collection/handling, submission of specimen other than nasopharyngeal swab, presence of viral mutation(s) within the areas targeted by this assay, and inadequate number of viral copies(<138 copies/mL). A negative  result must be combined with clinical observations, patient history, and epidemiological information. The expected result is Negative.  Fact Sheet for Patients:  EntrepreneurPulse.com.au  Fact Sheet for Healthcare Providers:  IncredibleEmployment.be  This test is no t yet  approved or cleared by the [place] FDA and  has been authorized for detection and/or diagnosis of SARS-CoV-2 by FDA under an Emergency Use Authorization (EUA). This EUA will remain  in effect (meaning this test can be used) for the duration of the COVID-19 declaration under Section 564(b)(1) of the Act, 21 U.S.C.section 360bbb-3(b)(1), unless the authorization is terminated  or revoked sooner.       Influenza A by PCR NEGATIVE NEGATIVE Final   Influenza B by PCR NEGATIVE NEGATIVE Final    Comment: (NOTE) The Xpert Xpress SARS-CoV-2/FLU/RSV plus assay is intended as an aid in the diagnosis of influenza from Nasopharyngeal swab specimens and should not be used as a sole basis for treatment. Nasal washings and aspirates are unacceptable for Xpert Xpress SARS-CoV-2/FLU/RSV testing.  Fact Sheet for Patients: EntrepreneurPulse.com.au  Fact Sheet for Healthcare Providers: IncredibleEmployment.be  This test is not yet approved or cleared by the [place] FDA and has been authorized for detection and/or diagnosis of SARS-CoV-2 by FDA under an Emergency Use Authorization (EUA). This EUA will remain in effect (meaning this test can be used) for the duration of the COVID-19 declaration under Section 564(b)(1) of the Act, 21 U.S.C. section 360bbb-3(b)(1), unless the authorization is terminated or revoked.     Resp Syncytial Virus by PCR NEGATIVE NEGATIVE Final    Comment: (NOTE) Fact Sheet for Patients: EntrepreneurPulse.com.au  Fact Sheet for Healthcare Providers: IncredibleEmployment.be  This test is not yet approved or cleared by the [place] FDA and has been authorized for detection and/or diagnosis of SARS-CoV-2 by FDA under an Emergency Use Authorization (EUA). This EUA will remain in effect (meaning this test can be used) for the duration of the COVID-19 declaration under Section 564(b)(1) of  the Act, 21 U.S.C. section 360bbb-3(b)(1), unless the authorization is terminated or revoked.  Performed at [facility], [place] [address] [place], [place] [address]      Time coordinating discharge:  I have spent 35 minutes face to face with the patient and on the ward discussing the patients care, assessment, plan and disposition with other care givers. >50% of the time was devoted counseling the patient about the risks and benefits of treatment/Discharge disposition and coordinating care.   SIGNED:   Damita Lack, MD  [facility] 03/25/2022, 11:16 AM   If 7PM-7AM, please contact night-coverage

## 2022-03-25 NOTE — Plan of Care (Signed)

## 2022-03-25 NOTE — Progress Notes (Signed)
Patient given discharge, medication, and follow up instructions, verbalized understanding, IV removed, personal belongings with patient, friend too transport home

## 2022-03-26 ENCOUNTER — Telehealth: Payer: Self-pay

## 2022-03-26 NOTE — Telephone Encounter (Signed)
Transition Care Management Follow-up Telephone Call Date of discharge and from where:TCM [facility] 03-25-22 Dx: diarrhea/dehydration   How have you been since you were released from the hospital? Feeling a little better  Any questions or concerns? No  Items Reviewed: Did the pt receive and understand the discharge instructions provided? Yes  Medications obtained and verified? Yes  Other? No  Any new allergies since your discharge? No  Dietary orders reviewed? Yes Do you have support at home? Yes   Home Care and Equipment/Supplies: Were home health services ordered? no If so, what is the name of the agency? na  Has the agency set up a time to come to the patient's home? not applicable Were any new equipment or medical supplies ordered?  No What is the name of the medical supply agency? na Were you able to get the supplies/equipment? not applicable Do you have any questions related to the use of the equipment or supplies? No  Functional Questionnaire: (I = Independent and D = Dependent) ADLs: I  Bathing/Dressing- I  Meal Prep- I  Eating- I  Maintaining continence- I  Transferring/Ambulation- I  Managing Meds- I  Follow up appointments reviewed:  PCP Hospital f/u appt confirmed? Yes  Scheduled to see Dr Martinique on 04-04-22 @ [facility] f/u appt confirmed? No  . Are transportation arrangements needed? No  If their condition worsens, is the pt aware to call PCP or go to the Emergency Dept.? Yes Was the patient provided with contact information for the PCP's office or ED? Yes Was to pt encouraged to call back with questions or concerns? Yes   Juanda Crumble LPN [facility] Direct Dial [phone]

## 2022-04-03 NOTE — Progress Notes (Unsigned)
HPI: [name] is a 47 y.o. female, who is here today to follow on recent hospitalization. She was admitted on 03/22/22 and discharged home on 03/25/22. TCM call on 03/26/22.  She presented to the ED c/o diarrhea and weakness. Symptoms started a few hours after eating in a local restaurant. 30+ watery stools/day. She was discharged on Azithromycin to complete 3 days and Levaquin.  Patient stool studies came back positive for EAEC and Salmonella.   ID consultation during hospitalization.  AKI with initial Cr 3.5 (baseline 0.9), 1.06 at the time of discharge. Lab Results  Component Value Date   CREATININE 1.06 (H) 03/25/2022   BUN 11 03/25/2022   NA 137 03/25/2022   K 4.1 03/25/2022   CL 109 03/25/2022   CO2 22 03/25/2022   Lab Results  Component Value Date   WBC 5.0 03/25/2022   HGB 9.1 (L) 03/25/2022   HCT 28.6 (L) 03/25/2022   MCV 88.3 03/25/2022   PLT 149 (L) 03/25/2022   Lab Results  Component Value Date   LIPASE 32 03/22/2022   Abdomen/pelvic CT showed: Fluid in the colon consistent with diarrhea with mild inflammatory fat stranding surrounding the cecum/ascending colon likely reflecting mild infectious or inflammatory colitis. There is 1.9 cm x 1.4 cm rounded lesion which appears to be in conjunction with the pancreatic tail.  Symptoms are gradually improving, stools are not at ehr baseline. She is having about 3 soft stools daily, no blood or melena. Periumbilical abdominal cramps, alleviated by defecation. Negative for fever,chills,nausea,vomiting,or urinary symptoms.  Feeling week and fatigue. She is not checking BP at home, today on lower normal range. She is on amlodipine 2.5 mg daily and olmesartan-HCTZ 40-25 mg daily.  BS's 100-120. Negative for visual changes, CP,SOB,orthopnea,PND,or edema.  Review of Systems  Constitutional:  Positive for activity change, appetite change and fatigue.  HENT:  Negative for mouth sores and sore throat.    Respiratory:  Negative for cough and wheezing.   Endocrine: Negative for polyphagia and polyuria.  Genitourinary:  Negative for decreased urine volume, dysuria and hematuria.  Skin:  Negative for rash.  Neurological:  Negative for syncope and facial asymmetry.  See other pertinent positives and negatives in HPI.  Current Outpatient Medications on File Prior to Visit  Medication Sig Dispense Refill   acetaminophen (TYLENOL) 650 MG CR tablet Take 1,300 mg by mouth 2 (two) times daily as needed for pain.     albuterol (VENTOLIN HFA) 108 (90 Base) MCG/ACT inhaler INHALE 2 PUFFS INTO THE LUNGS EVERY 6 HOURS AS NEEDED FOR WHEEZING OR SHORTNESS OF BREATH (Patient taking differently: Inhale 2 puffs into the lungs every 6 (six) hours as needed for wheezing or shortness of breath (related to seasonal allergies).) 8.5 g 3   amLODipine (NORVASC) 2.5 MG tablet Take 1 tablet (2.5 mg total) by mouth daily. 90 tablet 2   ammonium lactate (AMLACTIN) 12 % cream Apply topically as needed for dry skin. 385 g 6   BAYER ASPIRIN EC LOW DOSE 81 MG tablet Take 81 mg by mouth in the morning. Swallow whole.     ibuprofen (ADVIL) 200 MG tablet Take 400 mg by mouth every 6 (six) hours as needed for mild pain or headache.     ketoconazole (NIZORAL) 2 % cream Apply to both feet and between toes once daily for 6 weeks. 60 g 1   Lancets (ONETOUCH DELICA PLUS 123XX123) MISC USE TO TEST BLOOD SUGAR EVERYDAY 100 each 1   levocetirizine (  XYZAL) 5 MG tablet TAKE 1 TABLET(5 MG) BY MOUTH EVERY EVENING (Patient taking differently: Take 5 mg by mouth at bedtime. TAKE 1 TABLET(5 MG) BY MOUTH EVERY EVENING) 90 tablet 2   lovastatin (MEVACOR) 20 MG tablet TAKE 1 TABLET(20 MG) BY MOUTH AT BEDTIME (Patient taking differently: Take 20 mg by mouth at bedtime.) 90 tablet 3   mometasone (NASONEX) 50 MCG/ACT nasal spray SHAKE LIQUID AND USE 2 SPRAYS IN EACH NOSTRIL DAILY (Patient taking differently: Place 2 sprays into the nose daily as needed  (for seasonal allergies).) 17 g 4   montelukast (SINGULAIR) 10 MG tablet TAKE 1 TABLET(10 MG) BY MOUTH AT BEDTIME (Patient taking differently: Take 10 mg by mouth at bedtime.) 90 tablet 1   olmesartan-hydrochlorothiazide (BENICAR HCT) 40-25 MG tablet Take 1 tablet by mouth daily. 90 tablet 2   omeprazole (PRILOSEC) 40 MG capsule TAKE 1 CAPSULE(40 MG) BY MOUTH DAILY (Patient taking differently: Take 40 mg by mouth daily before breakfast.) 90 capsule 1   ONETOUCH VERIO test strip USE TO TEST BLOOD SUGAR EVERY DAY 100 strip 3   phenylephrine (SUDAFED PE) 10 MG TABS tablet Take 10 mg by mouth every 4 (four) hours as needed (WHEN FLYING, TO PREVENT ALTITUDE-RELATED EAR PAIN).     PREVIDENT 0.2 % SOLN 5 mLs by Mouth Rinse route 2 (two) times daily.     repaglinide (PRANDIN) 0.5 MG tablet Take 1 tablet (0.5 mg total) by mouth 2 (two) times daily before a meal. 180 tablet 3   tirzepatide (MOUNJARO) 5 MG/0.5ML Pen Inject 5 mg into the skin once a week. (Patient taking differently: Inject 5 mg into the skin every Saturday.) 2 mL 5   WIXELA INHUB 250-50 MCG/DOSE AEPB INHALE 1 PUFF INTO THE LUNGS TWICE DAILY 60 each 1   No current facility-administered medications on file prior to visit.    Past Medical History:  Diagnosis Date   Allergy    Anemia    Asthma    Diabetes mellitus without complication ([place]) [id number]   GERD (gastroesophageal reflux disease)    Hypertension    OSA (obstructive sleep apnea)    Sleep apnea    no cpap - told no OSA after told did so unsure if + or not    Allergies  Allergen Reactions   Codeine Other (See Comments)    Unable to Urinate     Social History   Socioeconomic History   Marital status: Married    Spouse name: Not on file   Number of children: 1   Years of education: Not on file   Highest education level: Master's degree (e.g., MA, MS, MEng, MEd, MSW, MBA)  Occupational History   Occupation: Software engineer  Tobacco Use   Smoking status:  Every Day    Packs/day: 0.50    Types: Cigarettes    Last attempt to quit: 11/25/2018    Years since quitting: 3.3   Smokeless tobacco: Never   Tobacco comments:    down to 3 cigarettes per day  Vaping Use   Vaping Use: Never used  Substance and Sexual Activity   Alcohol use: Yes    Comment: socially   Drug use: No   Sexual activity: Not on file  Other Topics Concern   Not on file  Social History Narrative   Not on file   Social Determinants of Health   Financial Resource Strain: Low Risk  (03/09/2021)   Overall Financial Resource Strain (CARDIA)    Difficulty of  Paying Living Expenses: Not very hard  Food Insecurity: No Food Insecurity (03/22/2022)   Hunger Vital Sign    Worried About Running Out of Food in the Last Year: Never true    Ran Out of Food in the Last Year: Never true  Transportation Needs: No Transportation Needs (03/22/2022)   PRAPARE - Hydrologist (Medical): No    Lack of Transportation (Non-Medical): No  Physical Activity: Insufficiently Active (03/09/2021)   Exercise Vital Sign    Days of Exercise per Week: 4 days    Minutes of Exercise per Session: 30 min  Stress: Stress Concern Present (03/09/2021)   [place]    Feeling of Stress : To some extent  Social Connections: Unknown (03/09/2021)   Social Connection and Isolation Panel [NHANES]    Frequency of Communication with Friends and Family: More than three times a week    Frequency of Social Gatherings with Friends and Family: Once a week    Attends Religious Services: Patient refused    Active Member of Clubs or Organizations: Yes    Attends Archivist Meetings: 1 to 4 times per year    Marital Status: Married   Vitals:   04/04/22 0756  BP: 100/70  Pulse: 100  Resp: 12  Temp: 98.6 F (37 C)  SpO2: 97%   Wt Readings from Last 3 Encounters:  04/04/22 (!) 307 lb 2 oz (139.3 kg)  03/22/22 (!)  313 lb 0.9 oz (142 kg)  03/05/22 (!) 313 lb (142 kg)  Body mass index is 41.65 kg/m.  Physical Exam Vitals and nursing note reviewed.  Constitutional:      General: She is not in acute distress.    Appearance: She is well-developed.  HENT:     Head: Normocephalic and atraumatic.     Mouth/Throat:     Mouth: Mucous membranes are dry.     Pharynx: Oropharynx is clear.  Eyes:     Conjunctiva/sclera: Conjunctivae normal.  Cardiovascular:     Rate and Rhythm: Normal rate and regular rhythm.     Pulses:          Dorsalis pedis pulses are 2+ on the right side and 2+ on the left side.     Heart sounds: No murmur heard. Pulmonary:     Effort: Pulmonary effort is normal. No respiratory distress.     Breath sounds: Normal breath sounds.  Abdominal:     Palpations: Abdomen is soft. There is no hepatomegaly or mass.     Tenderness: There is abdominal tenderness in the periumbilical area. There is no guarding or rebound.  Lymphadenopathy:     Cervical: No cervical adenopathy.  Skin:    General: Skin is warm.     Findings: No erythema or rash.  Neurological:     General: No focal deficit present.     Mental Status: She is alert and oriented to person, place, and time.     Cranial Nerves: No cranial nerve deficit.     Gait: Gait normal.  Psychiatric:        Mood and Affect: Mood and affect normal.   ASSESSMENT AND PLAN:  Ms. Suhailah was seen today for hospitalization follow-up.  Diagnoses and all orders for this visit: Lab Results  Component Value Date   WBC 4.9 04/04/2022   HGB 12.3 04/04/2022   HCT 36.5 04/04/2022   MCV 85.4 04/04/2022   PLT 309.0 04/04/2022  Lab Results  Component Value Date   CREATININE 1.08 04/04/2022   BUN 17 04/04/2022   NA 135 04/04/2022   K 3.7 04/04/2022   CL 97 04/04/2022   CO2 29 04/04/2022   Pancreatic lesion We reviewed CT results. Abdominal MRI will be arranged.  -     MR Abdomen W Wo Contrast; Future  Colitis, infectious Stool Cx  positive for EAEC and Salmonella.  Completed abx treatment. Symptoms are gradually improving, still having soft stools and abdominal cramps. Recommend daily probiotic, Align 1 cap daily. Adequate hydration and starchy soups. Instructed about warning signs.  [place] services are not necessary at this time. Excuse letter for work given. Continue contact precautions.  -     CBC; Future  Dehydration Initial ED evaluation with AKI, Cr 3.54. Decreased Olmesartan-HCTZ to 1/2 tab. Continue oral hydration. Instructed about warning signs.  Hypertension, essential, benign BP today on lower normal range, signs of mild dehydration. Recommend decreasing Olmesartan-HCTZ 40-25 mg from 1 tab daily to 0.5 tab daily. Continue Amlodipine 2.5 mg daily and low salt diet. Monitor BP at home.  Return if symptoms worsen or fail to improve, for keep next appointment.  Avanelle Pixley G. Martinique, MD  [facility]. [place] office.

## 2022-04-04 ENCOUNTER — Encounter: Payer: Self-pay | Admitting: Family Medicine

## 2022-04-04 ENCOUNTER — Ambulatory Visit (INDEPENDENT_AMBULATORY_CARE_PROVIDER_SITE_OTHER): Payer: BC Managed Care – PPO | Admitting: Family Medicine

## 2022-04-04 VITALS — BP 100/70 | HR 100 | Temp 98.6°F | Resp 12 | Ht 72.0 in | Wt 307.1 lb

## 2022-04-04 DIAGNOSIS — E860 Dehydration: Secondary | ICD-10-CM | POA: Diagnosis not present

## 2022-04-04 DIAGNOSIS — A09 Infectious gastroenteritis and colitis, unspecified: Secondary | ICD-10-CM | POA: Diagnosis not present

## 2022-04-04 DIAGNOSIS — I10 Essential (primary) hypertension: Secondary | ICD-10-CM | POA: Diagnosis not present

## 2022-04-04 DIAGNOSIS — K869 Disease of pancreas, unspecified: Secondary | ICD-10-CM | POA: Diagnosis not present

## 2022-04-04 LAB — CBC
HCT: 36.5 % (ref 36.0–46.0)
Hemoglobin: 12.3 g/dL (ref 12.0–15.0)
MCHC: 33.8 g/dL (ref 30.0–36.0)
MCV: 85.4 fl (ref 78.0–100.0)
Platelets: 309 10*3/uL (ref 150.0–400.0)
RBC: 4.28 Mil/uL (ref 3.87–5.11)
RDW: 14.8 % (ref 11.5–15.5)
WBC: 4.9 10*3/uL (ref 4.0–10.5)

## 2022-04-04 LAB — BASIC METABOLIC PANEL
BUN: 17 mg/dL (ref 6–23)
CO2: 29 mEq/L (ref 19–32)
Calcium: 10 mg/dL (ref 8.4–10.5)
Chloride: 97 mEq/L (ref 96–112)
Creatinine, Ser: 1.08 mg/dL (ref 0.40–1.20)
GFR: 60.53 mL/min (ref >60.00)
Glucose, Bld: 146 mg/dL — ABNORMAL HIGH (ref 70–99)
Potassium: 3.7 mEq/L (ref 3.5–5.1)
Sodium: 135 mEq/L (ref 135–145)

## 2022-04-04 NOTE — Patient Instructions (Addendum)
A few things to remember from today's visit:  Hypertension, essential, benign - Plan: Basic metabolic panel, CBC  Colitis, infectious - Plan: CBC  Dehydration  Decreased Olmesartan-hydrochlorothiazide from 1 tab to 1/2 tab daily. Monitor blood pressure at home. Continue adequate hydration and advancing diet as tolerated. Daily probiotic, Align 1 cap daily. Continue contact precautions.  If you need refills for medications you take chronically, please call your pharmacy. Do not use My Chart to request refills or for acute issues that need immediate attention. If you send a my chart message, it may take a few days to be addressed, specially if I am not in the office.  Please be sure medication list is accurate. If a new problem present, please set up appointment sooner than planned today.

## 2022-04-05 NOTE — Assessment & Plan Note (Signed)
BP today on lower normal range, signs of mild dehydration. Recommend decreasing Olmesartan-HCTZ 40-25 mg from 1 tab daily to 0.5 tab daily. Continue Amlodipine 2.5 mg daily and low salt diet. Monitor BP at home.

## 2022-04-22 ENCOUNTER — Ambulatory Visit
Admission: RE | Admit: 2022-04-22 | Discharge: 2022-04-22 | Disposition: A | Discharge status: Home / Self Care | Payer: BC Managed Care – PPO | Source: Ambulatory Visit | Attending: Family Medicine | Admitting: Family Medicine

## 2022-04-22 DIAGNOSIS — K869 Disease of pancreas, unspecified: Secondary | ICD-10-CM

## 2022-04-22 MED ORDER — GADOPICLENOL 0.5 MMOL/ML IV SOLN
10.0000 mL | Freq: Once | INTRAVENOUS | Status: AC | PRN
  Administered 2022-04-22: 10 mL via INTRAVENOUS

## 2022-04-23 NOTE — Progress Notes (Unsigned)
  Cardiology Office Note:    Date:  04/24/2022  ID:  Monica [name], DOB 1975-12-10, MRN [id number] [place] Providers Cardiologist:  Early Osmond, MD      Patient Profile:   Diabetes mellitus  Hypertension  Hyperlipidemia  Tobacco use Obesity  Chest pain  CCTA 04/20/21: pRCA mild soft plaque < 25, CAC score 0 TTE 04/20/21: EF 55-60, no RWMA, GLS -22.7, NL RVSF, RVSP 29.7, mild MR, RAP 8    History of Present Illness:   Monica [name] is a 47 y.o. female returns for cardiology f/u. She was evaluated by Dr. Ali Lowe in 03/2021 for chest pain. A CCTA demonstrated a CAC score of 0 and no significant CAD. Echocardiogram showed normal EF and no valvular disease. She is here alone today. She was recently admitted 2/1-2/4 with enteroaggressive E coli and Salmonella colitis. Her SCr was > 3 upon admit. Her most recent SCr since d/c was back to normal. She is overall doing well w/o chest pain, shortness of breath, syncope, palpitations.   Review of Systems  Gastrointestinal:  Positive for diarrhea.   Otherwise see HPI.    Studies Reviewed:    EKG:  EKG from her recent hospitalization was personally reviewed and interpreted - 03/22/22: sinus tachycardia, HR 101, QTc 405, non-specific ST-TW changes.  Risk Assessment/Calculations:             Physical Exam:   VS:  BP 96/70   Pulse (!) 104   Ht 6' (1.829 m)   Wt 300 lb (136.1 kg)   SpO2 97%   BMI 40.69 kg/m    Wt Readings from Last 3 Encounters:  04/24/22 300 lb (136.1 kg)  04/04/22 (!) 307 lb 2 oz (139.3 kg)  03/22/22 (!) 313 lb 0.9 oz (142 kg)    Constitutional:      Appearance: Healthy appearance. Not in distress.  Pulmonary:     Breath sounds: Normal breath sounds. No wheezing. No rales.  Cardiovascular:     Normal rate. Regular rhythm. Normal S1. Normal S2.      Murmurs: There is no murmur.  Edema:    Peripheral edema absent.  Abdominal:     Palpations: Abdomen is soft.  Musculoskeletal:     Cervical back: Neck  supple.      ASSESSMENT AND PLAN:   Hypertension, essential, benign BP is well controlled. She had to reduce her Olmesartan/HCTZ due to low BP in the setting of recent admission for colitis. From a CV risk standpoint, she is doing fairly well. CCTA last year showed a CAC score of 0. Her echocardiogram showed normal EF. Recent A1c was 6.8. Recent LDL was optimal at 79 on Lovastatin 20 mg. She remains on ASA 81 mg once daily. As she is a diabetic and continues to smoke and there was a mild soft plaque noted on her CCTA, it seems fairly reasonable to continue ASA. I have recommended that she quit smoking. F/u with Dr. Ali Lowe or me in 1 year.   Mild mitral regurgitation Mild on echocardiogram in 2023. Consider repeat echocardiogram in 2-3 years.         Dispo:  Return in about 1 year (around 04/24/2023) for Routine Follow Up, w/ Dr. Ali Lowe, or Richardson Dopp, PA-C. Signed, Richardson Dopp, PA-C

## 2022-04-24 ENCOUNTER — Encounter: Payer: Self-pay | Admitting: Physician Assistant

## 2022-04-24 ENCOUNTER — Ambulatory Visit: Payer: BC Managed Care – PPO | Attending: Physician Assistant | Admitting: Physician Assistant

## 2022-04-24 VITALS — BP 96/70 | HR 104 | Ht 72.0 in | Wt 300.0 lb

## 2022-04-24 DIAGNOSIS — I10 Essential (primary) hypertension: Secondary | ICD-10-CM

## 2022-04-24 DIAGNOSIS — I340 Nonrheumatic mitral (valve) insufficiency: Secondary | ICD-10-CM | POA: Insufficient documentation

## 2022-04-24 NOTE — Assessment & Plan Note (Signed)
Mild on echocardiogram in 2023. Consider repeat echocardiogram in 2-3 years.

## 2022-04-24 NOTE — Assessment & Plan Note (Signed)
BP is well controlled. She had to reduce her Olmesartan/HCTZ due to low BP in the setting of recent admission for colitis. From a CV risk standpoint, she is doing fairly well. CCTA last year showed a CAC score of 0. Her echocardiogram showed normal EF. Recent A1c was 6.8. Recent LDL was optimal at 79 on Lovastatin 20 mg. She remains on ASA 81 mg once daily. As she is a diabetic and continues to smoke and there was a mild soft plaque noted on her CCTA, it seems fairly reasonable to continue ASA. I have recommended that she quit smoking. F/u with Dr. Ali Lowe or me in 1 year.

## 2022-04-24 NOTE — Patient Instructions (Signed)
Medication Instructions:  Your physician recommends that you continue on your current medications as directed. Please refer to the Current Medication list given to you today.  *If you need a refill on your cardiac medications before your next appointment, please call your pharmacy*   Lab Work: None ordered If you have labs (blood work) drawn today and your tests are completely normal, you will receive your results only by: [place] (if you have MyChart) OR A paper copy in the mail If you have any lab test that is abnormal or we need to change your treatment, we will call you to review the results.   Testing/Procedures: None ordered   Follow-Up: At [facility], you and your health needs are our priority.  As part of our continuing mission to provide you with exceptional heart care, we have created designated Provider Care Teams.  These Care Teams include your primary Cardiologist (physician) and Advanced Practice Providers (APPs -  Physician Assistants and Nurse Practitioners) who all work together to provide you with the care you need, when you need it.  We recommend signing up for the patient portal called "MyChart".  Sign up information is provided on this After Visit Summary.  MyChart is used to connect with patients for Virtual Visits (Telemedicine).  Patients are able to view lab/test results, encounter notes, upcoming appointments, etc.  Non-urgent messages can be sent to your provider as well.   To learn more about what you can do with MyChart, go to [email or web address].    Your next appointment:   1 year(s)  Provider:   Early Osmond, MD     Other Instructions

## 2022-05-02 ENCOUNTER — Other Ambulatory Visit: Payer: Self-pay | Admitting: Family Medicine

## 2022-05-02 DIAGNOSIS — K219 Gastro-esophageal reflux disease without esophagitis: Secondary | ICD-10-CM

## 2022-06-04 ENCOUNTER — Ambulatory Visit: Payer: BC Managed Care – PPO | Admitting: Family Medicine

## 2022-06-04 ENCOUNTER — Encounter: Payer: Self-pay | Admitting: Family Medicine

## 2022-06-04 VITALS — BP 126/80 | HR 88 | Temp 98.1°F | Resp 16 | Ht 72.0 in | Wt 304.2 lb

## 2022-06-04 DIAGNOSIS — K118 Other diseases of salivary glands: Secondary | ICD-10-CM

## 2022-06-04 DIAGNOSIS — J301 Allergic rhinitis due to pollen: Secondary | ICD-10-CM

## 2022-06-04 DIAGNOSIS — J029 Acute pharyngitis, unspecified: Secondary | ICD-10-CM

## 2022-06-04 LAB — POCT RAPID STREP A (OFFICE): Rapid Strep A Screen: NEGATIVE

## 2022-06-04 NOTE — Progress Notes (Signed)
ACUTE VISIT Chief Complaint  Patient presents with   tonsil pain    Right tonsil with swelling, started on Tuesday. Having sore throat & runny nose.    HPI: [name] is a 47 y.o. female with past medical history significant for hypertension, asthma, allergy rhinitis, GERD, DM 2, and tobacco use here today complaining of right-sided sore throat as described above. Sore Throat  This is a new problem. The current episode started in the past 7 days. The problem has been unchanged. The pain is worse on the right side. There has been no fever. The pain is at a severity of 3/10. The pain is moderate. Associated symptoms include ear pain. Pertinent negatives include no abdominal pain, congestion, coughing, diarrhea, drooling, ear discharge, headaches, hoarse voice, plugged ear sensation, neck pain, shortness of breath, stridor, swollen glands, trouble swallowing or vomiting. She has had no exposure to strep or mono.  She reports experiencing pain when swallowing but not when touching neck around the affected area.  She recently attended a conference in [place], where there was a lot of screaming and flowers all around.  She has not noted neck swelling or masses. Mild left earache.  Pain has remained consistent since its onset,rating the worst pain as an 8 out of 10 and the current pain as a 3 out of 10.  She denies experiencing fever and chills but reports having rhinorrhea and sneezing.  She was exposed to someone who tested positive for COVID-19 at the conference, but she has tested negative three times since then.  She has been taking Zyrtec, using a Nasonex nasal spray,and trying warm water with honey and ginger for symptom relief.   Review of Systems  Constitutional:  Negative for activity change, appetite change and chills.  HENT:  Positive for ear pain and postnasal drip. Negative for congestion, drooling, ear discharge, hoarse voice, mouth sores, sinus pressure and trouble  swallowing.   Respiratory:  Negative for cough, shortness of breath and stridor.   Gastrointestinal:  Negative for abdominal pain, diarrhea and vomiting.  Musculoskeletal:  Negative for gait problem and neck pain.  Skin:  Negative for rash.  Allergic/Immunologic: Positive for environmental allergies.  Neurological:  Negative for syncope and headaches.  See other pertinent positives and negatives in HPI.  Current Outpatient Medications on File Prior to Visit  Medication Sig Dispense Refill   acetaminophen (TYLENOL) 650 MG CR tablet Take 1,300 mg by mouth 2 (two) times daily as needed for pain.     albuterol (VENTOLIN HFA) 108 (90 Base) MCG/ACT inhaler INHALE 2 PUFFS INTO THE LUNGS EVERY 6 HOURS AS NEEDED FOR WHEEZING OR SHORTNESS OF BREATH 8.5 g 3   amLODipine (NORVASC) 2.5 MG tablet Take 1 tablet (2.5 mg total) by mouth daily. 90 tablet 2   ammonium lactate (AMLACTIN) 12 % cream Apply topically as needed for dry skin. 385 g 6   BAYER ASPIRIN EC LOW DOSE 81 MG tablet Take 81 mg by mouth in the morning. Swallow whole.     ibuprofen (ADVIL) 200 MG tablet Take 400 mg by mouth every 6 (six) hours as needed for mild pain or headache.     ketoconazole (NIZORAL) 2 % cream Apply to both feet and between toes once daily for 6 weeks. 60 g 1   Lancets (ONETOUCH DELICA PLUS LANCET33G) MISC USE TO TEST BLOOD SUGAR EVERYDAY 100 each 1   levocetirizine (XYZAL) 5 MG tablet TAKE 1 TABLET(5 MG) BY MOUTH EVERY EVENING 90 tablet  2   lovastatin (MEVACOR) 20 MG tablet TAKE 1 TABLET(20 MG) BY MOUTH AT BEDTIME 90 tablet 3   mometasone (NASONEX) 50 MCG/ACT nasal spray Place 2 sprays into the nose as needed (for allegies).     montelukast (SINGULAIR) 10 MG tablet TAKE 1 TABLET(10 MG) BY MOUTH AT BEDTIME 90 tablet 1   olmesartan-hydrochlorothiazide (BENICAR HCT) 40-25 MG tablet Take 1 tablet by mouth daily. Take 1/2 tablet by mouth daily     omeprazole (PRILOSEC) 40 MG capsule TAKE 1 CAPSULE(40 MG) BY MOUTH DAILY 90  capsule 1   ONETOUCH VERIO test strip USE TO TEST BLOOD SUGAR EVERY DAY 100 strip 3   phenylephrine (SUDAFED PE) 10 MG TABS tablet Take 10 mg by mouth every 4 (four) hours as needed (WHEN FLYING, TO PREVENT ALTITUDE-RELATED EAR PAIN).     PREVIDENT 0.2 % SOLN 5 mLs by Mouth Rinse route 2 (two) times daily.     repaglinide (PRANDIN) 0.5 MG tablet Take 1 tablet (0.5 mg total) by mouth 2 (two) times daily before a meal. 180 tablet 3   tirzepatide (MOUNJARO) 5 MG/0.5ML Pen Inject 5 mg into the skin once a week. 2 mL 5   No current facility-administered medications on file prior to visit.    Past Medical History:  Diagnosis Date   Allergy    Anemia    Asthma    Diabetes mellitus without complication 04/2016   GERD (gastroesophageal reflux disease)    Hypertension    OSA (obstructive sleep apnea)    Sleep apnea    no cpap - told no OSA after told did so unsure if + or not    Allergies  Allergen Reactions   Codeine Other (See Comments)    Unable to Urinate     Social History   Socioeconomic History   Marital status: Married    Spouse name: Not on file   Number of children: 1   Years of education: Not on file   Highest education level: Master's degree (e.g., MA, MS, MEng, MEd, MSW, MBA)  Occupational History   Occupation: Solicitor  Tobacco Use   Smoking status: Every Day    Packs/day: .5    Types: Cigarettes    Last attempt to quit: 11/25/2018    Years since quitting: 3.5   Smokeless tobacco: Never   Tobacco comments:    down to 3 cigarettes per day  Vaping Use   Vaping Use: Never used  Substance and Sexual Activity   Alcohol use: Yes    Comment: socially   Drug use: No   Sexual activity: Not on file  Other Topics Concern   Not on file  Social History Narrative   Not on file   Social Determinants of Health   Financial Resource Strain: Low Risk  (03/09/2021)   Overall Financial Resource Strain (CARDIA)    Difficulty of Paying Living Expenses: Not  very hard  Food Insecurity: No Food Insecurity (03/22/2022)   Hunger Vital Sign    Worried About Running Out of Food in the Last Year: Never true    Ran Out of Food in the Last Year: Never true  Transportation Needs: No Transportation Needs (03/22/2022)   PRAPARE - Administrator, Civil Service (Medical): No    Lack of Transportation (Non-Medical): No  Physical Activity: Insufficiently Active (03/09/2021)   Exercise Vital Sign    Days of Exercise per Week: 4 days    Minutes of Exercise per Session: 30  min  Stress: Stress Concern Present (03/09/2021)   [employer] of Occupational Health - Occupational Stress Questionnaire    Feeling of Stress : To some extent  Social Connections: Unknown (03/09/2021)   Social Connection and Isolation Panel [NHANES]    Frequency of Communication with Friends and Family: More than three times a week    Frequency of Social Gatherings with Friends and Family: Once a week    Attends Religious Services: Patient declined    Database administrator or Organizations: Yes    Attends Banker Meetings: 1 to 4 times per year    Marital Status: Married   Vitals:   06/04/22 0655  BP: 126/80  Pulse: 88  Resp: 16  Temp: 98.1 F (36.7 C)  SpO2: 99%   Body mass index is 41.26 kg/m.  Physical Exam Vitals and nursing note reviewed.  Constitutional:      General: She is not in acute distress.    Appearance: She is well-developed. She is not ill-appearing.  HENT:     Head: Normocephalic and atraumatic.     Right Ear: Tympanic membrane, ear canal and external ear normal.     Left Ear: Tympanic membrane, ear canal and external ear normal.     Nose: Rhinorrhea present. No congestion.     Mouth/Throat:     Mouth: Mucous membranes are moist.     Pharynx: Oropharynx is clear.  Eyes:     Conjunctiva/sclera: Conjunctivae normal.  Cardiovascular:     Rate and Rhythm: Normal rate and regular rhythm.     Heart sounds: No murmur  heard. Pulmonary:     Effort: Pulmonary effort is normal. No respiratory distress.     Breath sounds: Normal breath sounds. No stridor.  Musculoskeletal:     Cervical back: No edema or erythema. No muscular tenderness.  Lymphadenopathy:     Head:     Right side of head: No submandibular (tender with palpation.) adenopathy.     Left side of head: No submandibular adenopathy.     Cervical: No cervical adenopathy.  Skin:    General: Skin is warm.     Findings: No erythema or rash.  Neurological:     Mental Status: She is alert and oriented to person, place, and time.  Psychiatric:        Mood and Affect: Mood and affect normal.   ASSESSMENT AND PLAN:  Ms. Vanderkolk was seen today for sore throat.  Sore throat We discussed possible etiologies, viral vs allergies related. Rapid strep was negative, will follow throat Cx. For now recommend symptomatic treatment with gargles with saline and throat lozenges. Monitor for new symptoms. If problem is persistent we will consider ENT evaluation.  -     POCT rapid strep A -     Culture, Group A Strep; Future  Tenderness of submandibular gland Right submandibular gland tender upon palpation but it is not enlarged. Hx and examination today do not suggest a serious process. Monitor for new symptoms. I do not think further workup is needed at this time.  Allergic rhinitis due to pollen, unspecified seasonality Assessment & Plan: This could explain some of her symptomatology today. Continue Zyrtec 10 mg daily, Singulair 10 mg at bedtime, and Nasonex nasal spray daily as needed.  Return if symptoms worsen or fail to improve, for keep next appointment.  Sandon Yoho G. [place], MD  [facility]. [place] office.

## 2022-06-04 NOTE — Patient Instructions (Addendum)
A few things to remember from today's visit:  Sore throat  Tenderness of submandibular gland For now recommend symptomatic treatment with gargles with saline water and throat lozenges. Examination today is not suggestive of a serious process. Will follow throat culture. Monitor for new symptoms.  If you need refills for medications you take chronically, please call your pharmacy. Do not use My Chart to request refills or for acute issues that need immediate attention. If you send a my chart message, it may take a few days to be addressed, specially if I am not in the office.  Please be sure medication list is accurate. If a new problem present, please set up appointment sooner than planned today.

## 2022-06-04 NOTE — Assessment & Plan Note (Signed)
This could explain some of her symptomatology today. Continue Zyrtec 10 mg daily, Singulair 10 mg at bedtime, and Nasonex nasal spray daily as needed.

## 2022-06-06 LAB — CULTURE, GROUP A STREP
MICRO NUMBER:: 14824927
SPECIMEN QUALITY:: ADEQUATE

## 2022-06-15 ENCOUNTER — Encounter: Payer: Self-pay | Admitting: Podiatry

## 2022-06-15 ENCOUNTER — Ambulatory Visit (INDEPENDENT_AMBULATORY_CARE_PROVIDER_SITE_OTHER): Payer: BC Managed Care – PPO | Admitting: Podiatry

## 2022-06-15 DIAGNOSIS — B351 Tinea unguium: Secondary | ICD-10-CM

## 2022-06-15 DIAGNOSIS — M79675 Pain in left toe(s): Secondary | ICD-10-CM | POA: Diagnosis not present

## 2022-06-15 DIAGNOSIS — L853 Xerosis cutis: Secondary | ICD-10-CM

## 2022-06-15 DIAGNOSIS — M79674 Pain in right toe(s): Secondary | ICD-10-CM

## 2022-06-15 DIAGNOSIS — E1169 Type 2 diabetes mellitus with other specified complication: Secondary | ICD-10-CM

## 2022-06-15 DIAGNOSIS — L84 Corns and callosities: Secondary | ICD-10-CM | POA: Diagnosis not present

## 2022-06-15 NOTE — Patient Instructions (Signed)
Purchase Remedy Silicone Cream from [employer]. Apply to feet once daily.

## 2022-06-15 NOTE — Progress Notes (Signed)
  Subjective:  Patient ID: Monica [name], female    DOB: 07/10/1975,  MRN: [id number]  Monica [name] presents to clinic today for preventative diabetic foot care and callus(es) b/l feet and painful thick toenails that are difficult to trim. Painful toenails interfere with ambulation. Aggravating factors include wearing enclosed shoe gear. Pain is relieved with periodic professional debridement. Painful calluses are aggravated when weightbearing with and without shoegear. Pain is relieved with periodic professional debridement.  Chief Complaint  Patient presents with   Diabetes    DFC BS - DIDN'T TAKE IT THIS MORNING, YESTERDAY WAS 125 A1C - 6.9 [facility]P - 05/2022    New problem(s): None.   PCP is [place], Betty G, MD.  Allergies  Allergen Reactions   Codeine Other (See Comments)    Unable to Urinate     Review of Systems: Negative except as noted in the HPI.  Objective:  There were no vitals filed for this visit. Monica [name] is a pleasant 47 y.o. female in NAD. AAO x 3.  Vascular Examination: Capillary refill time to digits immediate b/l. Palpable DP pulse(s) b/l lower extremities Palpable PT pulse(s) b/l lower extremities Pedal hair sparse. Lower extremity skin temperature gradient within normal limits. No pain with calf compression b/l. No edema noted b/l lower extremities.  Dermatological Examination: No open wounds b/l lower extremities. No interdigital macerations b/l lower extremities.   Toenails 1-5 b/l elongated, discolored, dystrophic, thickened, crumbly with subungual debris and tenderness to dorsal palpation.   Hyperkeratotic lesion(s) submet head 5 left foot and submet head 5 right foot.  No erythema, no edema, no drainage, no fluctuance.   Dry skin noted b/l LE.  Musculoskeletal Examination: Normal muscle strength 5/5 to all lower extremity muscle groups bilaterally. No pain crepitus or joint limitation noted with ROM b/l lower extremities.   Pes planus  deformity noted b/l lower extremities. Patient ambulates independent of any assistive aids. Wearing appropriate fitting shoe gear.  Assessment/Plan: 1. Pain due to onychomycosis of toenails of both feet   2. Callus   3. Xerosis cutis   4. Type 2 diabetes mellitus with other specified complication, without long-term current use of insulin (HCC)     -Consent given for treatment as described below: -Examined patient. -Recommended Remedy Silicone Cream for dry skin which may be purchased on [employer]. -Toenails 1-5 b/l were debrided in length and girth with sterile nail nippers and dremel without iatrogenic bleeding.  -Callus(es) submet head 5 b/l pared utilizing sterile scalpel blade without complication or incident. Total number debrided =2. -Patient/POA to call should there be question/concern in the interim.   Return in about 3 months (around 09/14/2022).  Freddie Breech, DPM

## 2022-06-18 IMAGING — MG MM DIGITAL SCREENING BILAT W/ TOMO AND CAD
8 of 16 series · 8 of 40 positions shown · non-contrast
Comparison: Previous exam(s).

CLINICAL DATA: Screening.

EXAM:
DIGITAL SCREENING BILATERAL MAMMOGRAM WITH TOMOSYNTHESIS AND CAD
TECHNIQUE: Bilateral screening digital craniocaudal and mediolateral oblique
mammograms were obtained. Bilateral screening digital breast
tomosynthesis was performed. The images were evaluated with
computer-aided detection.

[R CC synth-2D (1 of 2)]
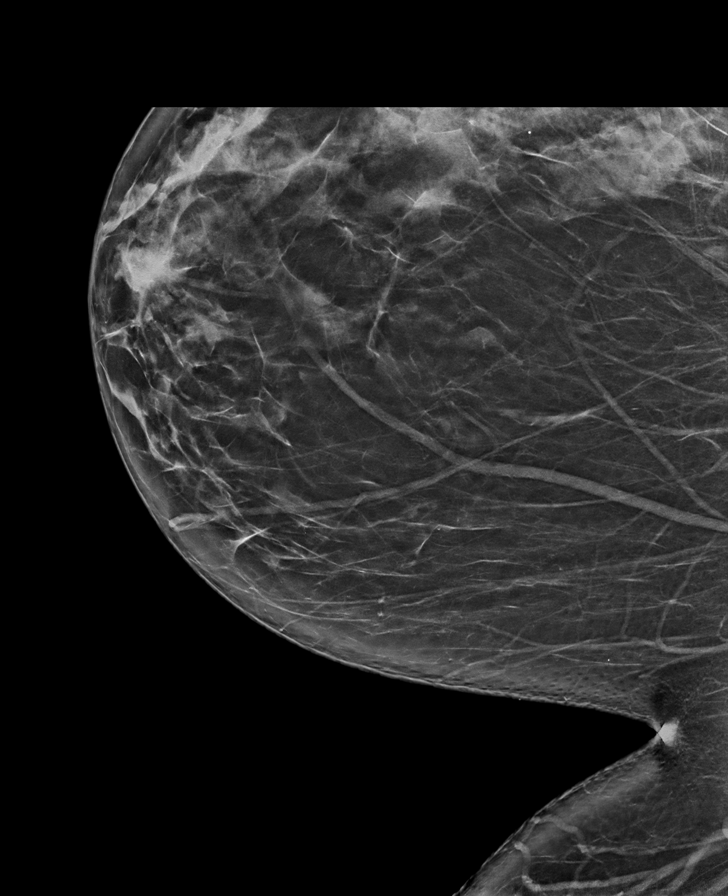

[L CC synth-2D (1 of 2)]
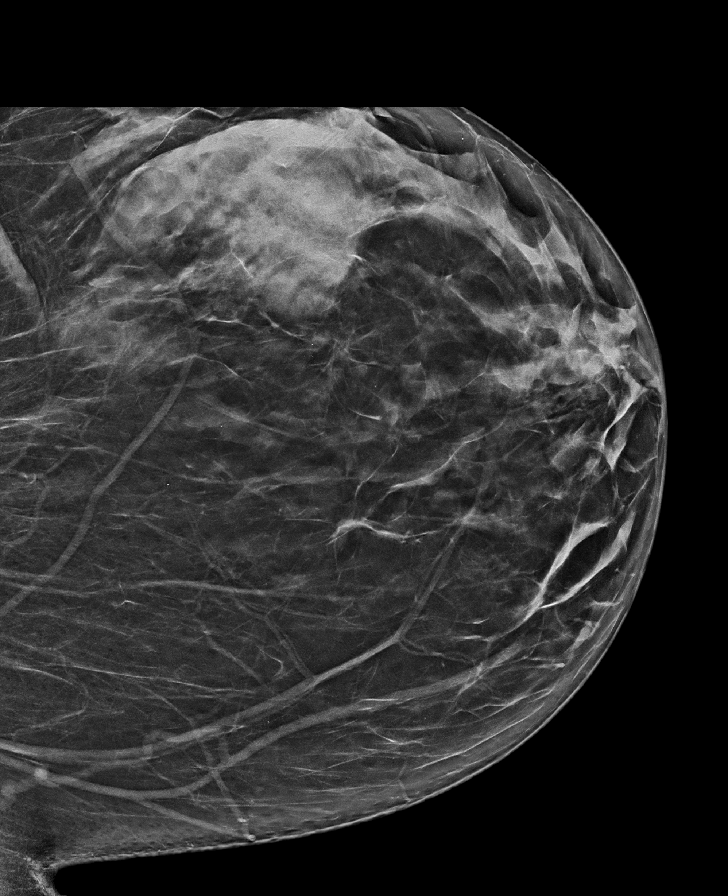

[L MLO synth-2D (1 of 2)]
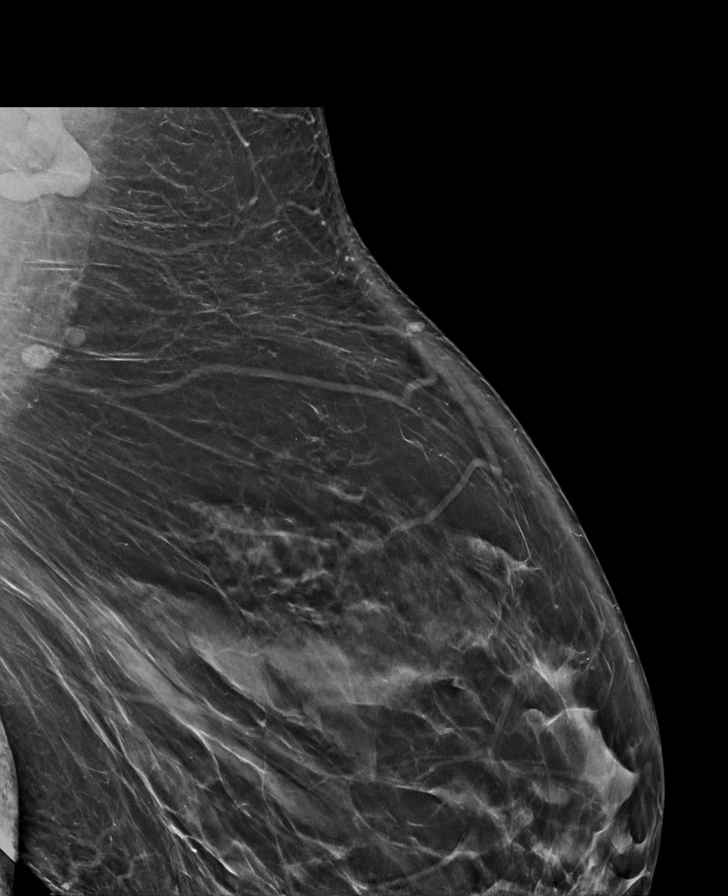

[L MLO synth-2D (2 of 2)]
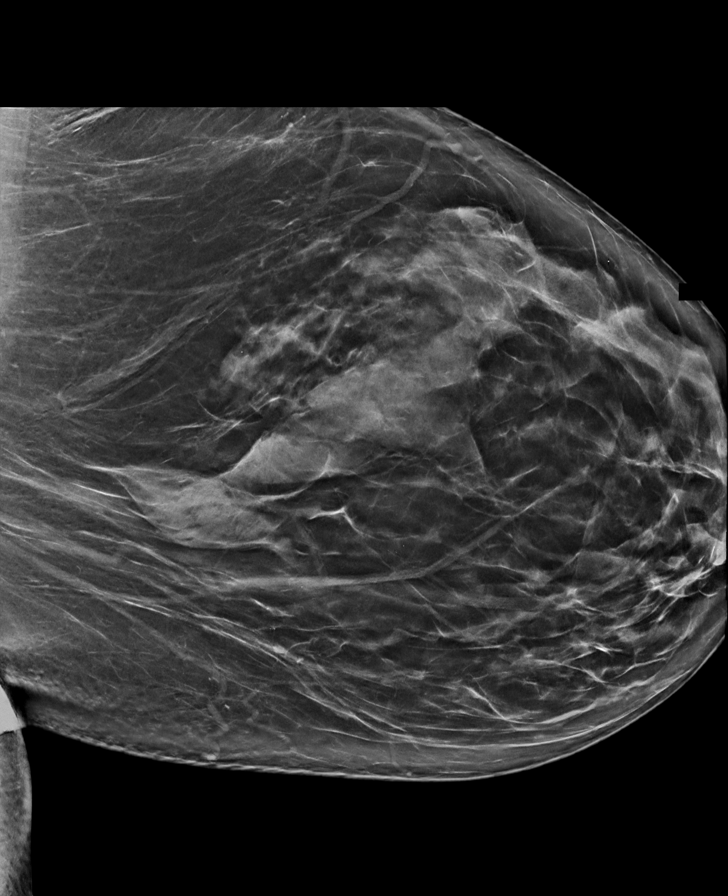

[L CC synth-2D (2 of 2)]
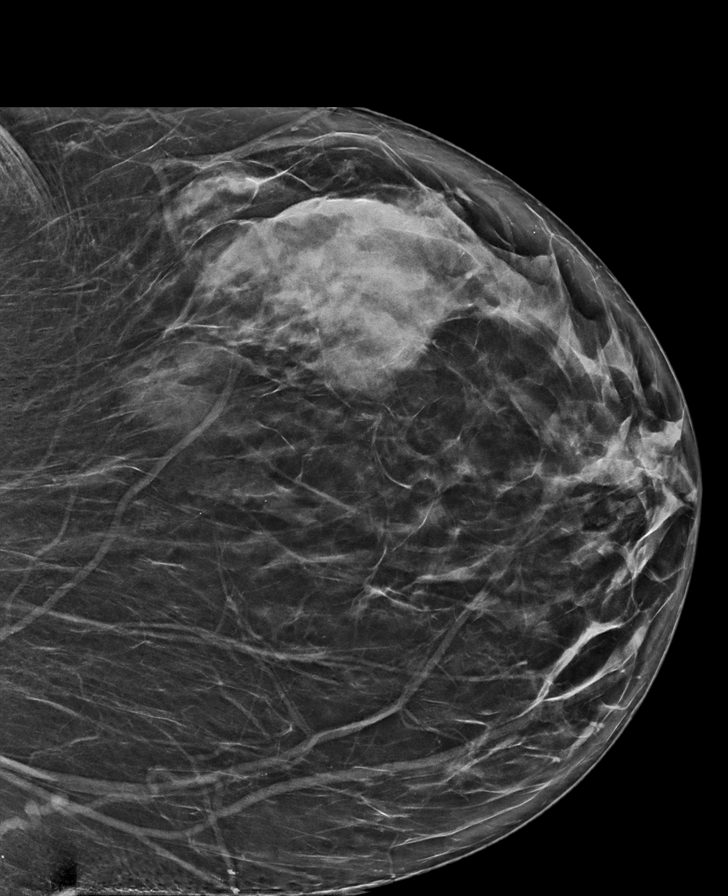

[R MLO synth-2D]
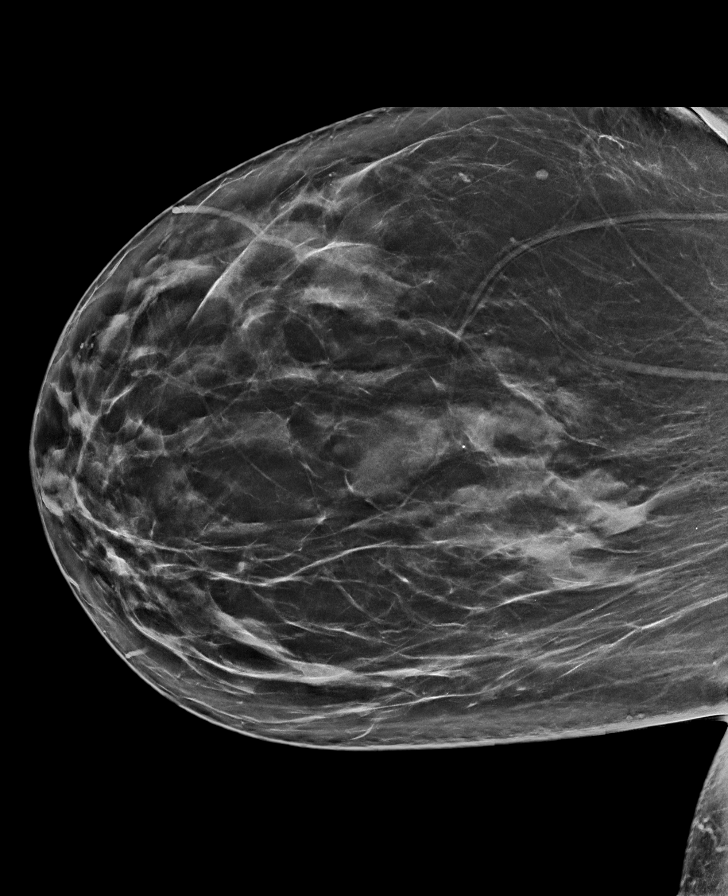

[R CC synth-2D (2 of 2)]
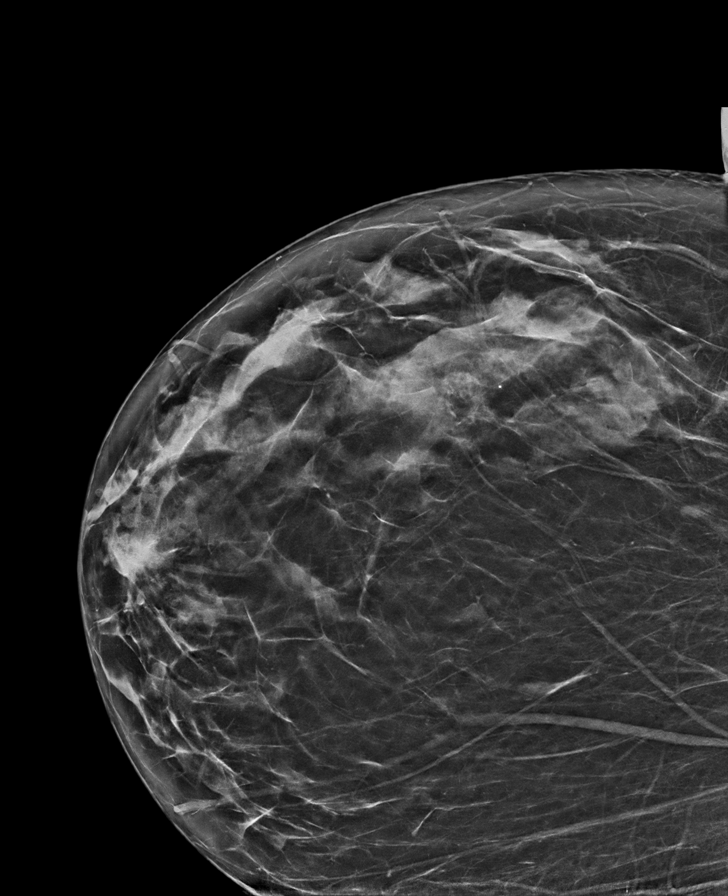

[L MLO tomo · tomo slice 49/96.0]
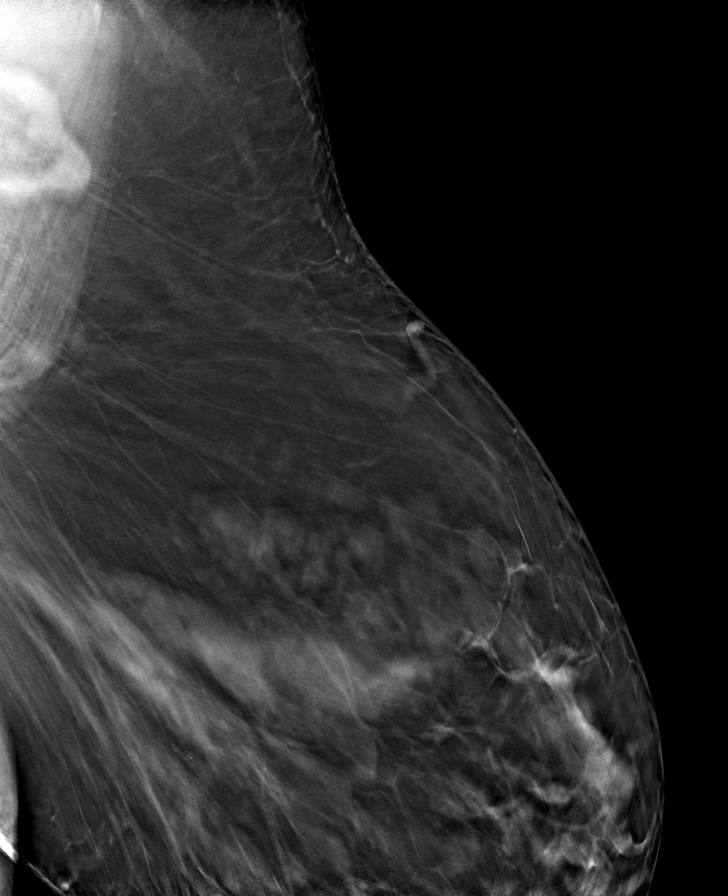

[8 of 40 positions shown; findings below may reference images not displayed]

ACR Breast Density Category c: The breast tissue is heterogeneously
dense, which may obscure small masses.
FINDINGS: There are no findings suspicious for malignancy.
IMPRESSION: No mammographic evidence of malignancy. A result letter of this
screening mammogram will be mailed directly to the patient.

RECOMMENDATION:
Screening mammogram in one year. (Code:Q3-W-BC3)

BI-RADS CATEGORY  1: Negative.

## 2022-06-25 ENCOUNTER — Ambulatory Visit (INDEPENDENT_AMBULATORY_CARE_PROVIDER_SITE_OTHER): Payer: BC Managed Care – PPO | Admitting: Family Medicine

## 2022-06-25 ENCOUNTER — Ambulatory Visit (INDEPENDENT_AMBULATORY_CARE_PROVIDER_SITE_OTHER): Payer: BC Managed Care – PPO

## 2022-06-25 ENCOUNTER — Encounter: Payer: Self-pay | Admitting: Family Medicine

## 2022-06-25 VITALS — BP 108/78 | HR 92 | Ht 72.0 in | Wt 295.6 lb

## 2022-06-25 DIAGNOSIS — M25532 Pain in left wrist: Secondary | ICD-10-CM | POA: Diagnosis not present

## 2022-06-25 NOTE — Patient Instructions (Signed)
Thank you for coming in today.   Recheck in 2 weeks.   Use the wrist brace.   Please use Voltaren gel (Generic Diclofenac Gel) up to 4x daily for pain as needed.  This is available over-the-counter as both the name brand Voltaren gel and the generic diclofenac gel.

## 2022-06-25 NOTE — Progress Notes (Signed)
I, Stevenson Clinch, CMA acting as a scribe for Clementeen Graham, MD.  Monica [name] is a 47 y.o. female who presents to [facility] at [facility] today for L wrist pain. Pt was previously seen by Dr. Denyse Amass on 08/15/21 for R trigger thumb.  Today, pt reports she fell out of a hammock 10 days ago,  landing chest first on the concrete and catching the left wrist on a nearby table. Pt locates pain to ulnar aspect, pain and swelling. Some bruising present at time of injury. Losing mobility, worsens throughout the day. Minimal pain while at rest. Works in [employer].   L wrist swelling: yes Grip strength: yes Aggravates: use Treatments tried: Tylenol  Pertinent review of systems: No fevers or chills  Relevant historical information: Hypertension and diabetes   Exam:  BP 108/78   Pulse 92   Ht 6' (1.829 m)   Wt 295 lb 9.6 oz (134.1 kg)   SpO2 97%   BMI 40.09 kg/m  General: Well Developed, well nourished, and in no acute distress.   MSK: Left wrist mild swelling at dorsal and ulnar wrist. Tender palpation ulnar wrist. Decreased range of motion pain with ulnar deviation and radial deviation. Intact strength. Pulses cap refill and sensation are intact distally.    Lab and Radiology Results  X-ray images left hand and wrist obtained today personally and independently interpreted. Findings significant for what appears to be a new avulsion fracture at the ulnar portion of the distal radius.  This is small and only minimally displaced. There is old appearing avulsion fragment at the ulnar styloid. Mild ulnar negative wrist positioning. No other acute fractures or present Await formal radiology review   Assessment and Plan: 47 y.o. female with left wrist pain after falling out of a hammock thought to be an avulsion fracture in the wrist.  Plan for immobilization with thumb spica splint and recheck in 2 weeks.  Recommend Voltaren gel.  Recommend against high-dose oral NSAIDs  given diabetes and hypertension.  Pressure currently well-controlled today.   PDMP not reviewed this encounter. Orders Placed This Encounter  Procedures   DG Wrist Complete Left    Standing Status:   Future    Number of Occurrences:   1    Standing Expiration Date:   07/26/2022    Order Specific Question:   Reason for Exam (SYMPTOM  OR DIAGNOSIS REQUIRED)    Answer:   left wrist pain    Order Specific Question:   Preferred imaging location?    Answer:   [facility]    Order Specific Question:   Is patient pregnant?    Answer:   No   DG Hand Complete Left    Standing Status:   Future    Number of Occurrences:   1    Standing Expiration Date:   07/26/2022    Order Specific Question:   Reason for Exam (SYMPTOM  OR DIAGNOSIS REQUIRED)    Answer:   left wrist/hand injury    Order Specific Question:   Preferred imaging location?    Answer:   [facility]    Order Specific Question:   Is patient pregnant?    Answer:   No   No orders of the defined types were placed in this encounter.    Discussed warning signs or symptoms. Please see discharge instructions. Patient expresses understanding.   The above documentation has been reviewed and is accurate and complete Clementeen Graham, M.D.

## 2022-06-29 NOTE — Progress Notes (Signed)
Left wrist x-ray shows that avulsion fracture we saw on your hand.  Continue current treatment like we talked about in clinic.

## 2022-06-29 NOTE — Progress Notes (Signed)
Left hand x-ray shows the possibility of an avulsion fracture in the wrist like we talked about in clinic. Continue the brace we talked about in clinic.

## 2022-07-03 ENCOUNTER — Ambulatory Visit: Payer: BC Managed Care – PPO | Admitting: Internal Medicine

## 2022-07-03 NOTE — Progress Notes (Deleted)
Patient ID: [name], female   DOB: 1975/09/07, 47 y.o.   MRN: [id number]  HPI: Monica [name] is a 47 y.o.-year-old female, returning for follow-up for DM2, dx in 2018 (GDM in 1999), non-insulin-dependent, uncontrolled, without long-term complications. Pt. previously saw Dr. Everardo All, but last visit with me 4 months ago.  Interim history: No increased urination, blurry vision, nausea, chest pain. She was able to start [facility] since last visit.  She lost 18 pounds. However, she was hospitalized with dehydration 03/22/2022 (diarrhea, colitis-infection with Salmonella, E. coli)  Reviewed HbA1c: Lab Results  Component Value Date   HGBA1C 6.8 (A) 03/05/2022   HGBA1C 6.7 (A) 11/01/2021   HGBA1C 6.9 (A) 04/19/2021   HGBA1C 7.2 (A) 01/18/2021   HGBA1C 7.4 (A) 10/17/2020   HGBA1C 6.5 (A) 06/20/2020   HGBA1C 6.6 (A) 01/08/2020   HGBA1C 7.3 (A) 09/15/2019   HGBA1C 6.9 (A) 07/13/2019   HGBA1C 7.6 (H) 12/26/2018   Pt is on a regimen of: - Repaglinide 0.5 mg 2x a day in am and at bedtime >> before the 2 main meals of the day - Ozempic 2 mg weekly >> Trulicity 3 mg weekly >> off >> Mounjaro 5 mg weekly (started 02/2022) She tried metformin but this caused abdominal pain. She tried Victoza but this caused nausea. She was also on Trulicity. She tried [place] but this caused vaginitis. She was previously on insulin between 2020 and 2021. She had poor night vision at night on Ozempic.  Pt checks her sugars 0-1x a day and they are: - am: 120s >> 90s, 124, 125 - 2h after b'fast: n/c - before lunch: n/c >> 140s - 2h after lunch: n/c - before dinner: 130s >> n/c - 2h after dinner: 156 >> n/c - bedtime: n/c - nighttime: n/c Lowest sugar was 86 >> 90; she has hypoglycemia awareness <90.  Highest sugar was 156 >> 400s (Prednisone).  Glucometer: One Touch Verio  - no CKD, last BUN/creatinine:  Lab Results  Component Value Date   BUN 17 04/04/2022   BUN 11 03/25/2022   CREATININE 1.08  04/04/2022   CREATININE 1.06 (H) 03/25/2022  On olmesartan 40 mg daily.  -+ HL; last set of lipids: Lab Results  Component Value Date   CHOL 148 08/15/2021   HDL 50.60 08/15/2021   LDLCALC 79 08/15/2021   TRIG 93.0 08/15/2021   CHOLHDL 3 08/15/2021  On lovastatin 20 mg daily.  - last eye exam was 06/24/2021. No DR.   - no numbness and tingling in her feet.  Last foot exam - Dr. Eloy End 06/15/2022.  05/10/2021: CAC score = 0.  She also has a history of HTN, ?OSA, GERD, asthma.  ROS: + see HPI  Past Medical History:  Diagnosis Date   Allergy    Anemia    Asthma    Diabetes mellitus without complication (HCC) 04/2016   GERD (gastroesophageal reflux disease)    Hypertension    OSA (obstructive sleep apnea)    Sleep apnea    no cpap - told no OSA after told did so unsure if + or not    Past Surgical History:  Procedure Laterality Date   ABDOMINAL HYSTERECTOMY  02/19/2006   BICEPS TENDON REPAIR Bilateral    FOOT SURGERY     Left   KNEE ARTHROSCOPY Bilateral    ROTATOR CUFF REPAIR Bilateral    Social History   Socioeconomic History   Marital status: Married    Spouse name: Not on file  Number of children: 1   Years of education: Not on file   Highest education level: Master's degree (e.g., MA, MS, MEng, MEd, MSW, MBA)  Occupational History   Occupation: Solicitor  Tobacco Use   Smoking status: Every Day    Packs/day: .5    Types: Cigarettes    Last attempt to quit: 11/25/2018    Years since quitting: 3.6   Smokeless tobacco: Never   Tobacco comments:    down to 3 cigarettes per day  Vaping Use   Vaping Use: Never used  Substance and Sexual Activity   Alcohol use: Yes    Comment: socially   Drug use: No   Sexual activity: Not on file  Other Topics Concern   Not on file  Social History Narrative   Not on file   Social Determinants of Health   Financial Resource Strain: Low Risk  (03/09/2021)   Overall Financial Resource Strain  (CARDIA)    Difficulty of Paying Living Expenses: Not very hard  Food Insecurity: No Food Insecurity (03/22/2022)   Hunger Vital Sign    Worried About Running Out of Food in the Last Year: Never true    Ran Out of Food in the Last Year: Never true  Transportation Needs: No Transportation Needs (03/22/2022)   PRAPARE - Administrator, Civil Service (Medical): No    Lack of Transportation (Non-Medical): No  Physical Activity: Insufficiently Active (03/09/2021)   Exercise Vital Sign    Days of Exercise per Week: 4 days    Minutes of Exercise per Session: 30 min  Stress: Stress Concern Present (03/09/2021)   [employer] of Occupational Health - Occupational Stress Questionnaire    Feeling of Stress : To some extent  Social Connections: Unknown (03/09/2021)   Social Connection and Isolation Panel [NHANES]    Frequency of Communication with Friends and Family: More than three times a week    Frequency of Social Gatherings with Friends and Family: Once a week    Attends Religious Services: Patient declined    Database administrator or Organizations: Yes    Attends Banker Meetings: 1 to 4 times per year    Marital Status: Married  Catering manager Violence: Not At Risk (03/22/2022)   Humiliation, Afraid, Rape, and Kick questionnaire    Fear of Current or Ex-Partner: No    Emotionally Abused: No    Physically Abused: No    Sexually Abused: No   Current Outpatient Medications on File Prior to Visit  Medication Sig Dispense Refill   acetaminophen (TYLENOL) 650 MG CR tablet Take 1,300 mg by mouth 2 (two) times daily as needed for pain.     albuterol (VENTOLIN HFA) 108 (90 Base) MCG/ACT inhaler INHALE 2 PUFFS INTO THE LUNGS EVERY 6 HOURS AS NEEDED FOR WHEEZING OR SHORTNESS OF BREATH 8.5 g 3   amLODipine (NORVASC) 2.5 MG tablet Take 1 tablet (2.5 mg total) by mouth daily. 90 tablet 2   ammonium lactate (AMLACTIN) 12 % cream Apply topically as needed for dry skin. 385 g 6    BAYER ASPIRIN EC LOW DOSE 81 MG tablet Take 81 mg by mouth in the morning. Swallow whole.     ibuprofen (ADVIL) 200 MG tablet Take 400 mg by mouth every 6 (six) hours as needed for mild pain or headache.     ketoconazole (NIZORAL) 2 % cream Apply to both feet and between toes once daily for 6 weeks. 60 g 1  Lancets (ONETOUCH DELICA PLUS LANCET33G) MISC USE TO TEST BLOOD SUGAR EVERYDAY 100 each 1   levocetirizine (XYZAL) 5 MG tablet TAKE 1 TABLET(5 MG) BY MOUTH EVERY EVENING 90 tablet 2   lovastatin (MEVACOR) 20 MG tablet TAKE 1 TABLET(20 MG) BY MOUTH AT BEDTIME 90 tablet 3   mometasone (NASONEX) 50 MCG/ACT nasal spray Place 2 sprays into the nose as needed (for allegies).     montelukast (SINGULAIR) 10 MG tablet TAKE 1 TABLET(10 MG) BY MOUTH AT BEDTIME 90 tablet 1   olmesartan-hydrochlorothiazide (BENICAR HCT) 40-25 MG tablet Take 1 tablet by mouth daily. Take 1/2 tablet by mouth daily     omeprazole (PRILOSEC) 40 MG capsule TAKE 1 CAPSULE(40 MG) BY MOUTH DAILY 90 capsule 1   ONETOUCH VERIO test strip USE TO TEST BLOOD SUGAR EVERY DAY 100 strip 3   phenylephrine (SUDAFED PE) 10 MG TABS tablet Take 10 mg by mouth every 4 (four) hours as needed (WHEN FLYING, TO PREVENT ALTITUDE-RELATED EAR PAIN).     PREVIDENT 0.2 % SOLN 5 mLs by Mouth Rinse route 2 (two) times daily.     repaglinide (PRANDIN) 0.5 MG tablet Take 1 tablet (0.5 mg total) by mouth 2 (two) times daily before a meal. 180 tablet 3   tirzepatide (MOUNJARO) 5 MG/0.5ML Pen Inject 5 mg into the skin once a week. 2 mL 5   No current facility-administered medications on file prior to visit.   Allergies  Allergen Reactions   Codeine Other (See Comments)    Unable to Urinate    Family History  Problem Relation Age of Onset   Hyperlipidemia Mother    Stroke Mother    Heart disease Mother    Hypertension Mother    Diabetes Mother    Colon polyps Mother        TA+   Hyperlipidemia Father    Heart disease Father    Stroke Father     Hypertension Father    Diabetes Father    Colon cancer Father        Dx'd in his 67's    Diabetes Brother    Hypertension Maternal Grandmother    Diabetes Mellitus II Maternal Grandmother    Colon cancer Paternal Uncle    Esophageal cancer Neg Hx    Rectal cancer Neg Hx    Stomach cancer Neg Hx    PE: There were no vitals taken for this visit. Wt Readings from Last 3 Encounters:  06/25/22 295 lb 9.6 oz (134.1 kg)  06/04/22 (!) 304 lb 4 oz (138 kg)  04/24/22 300 lb (136.1 kg)   Constitutional: obese, in NAD Eyes: no exophthalmos ENT:  no thyromegaly, no cervical lymphadenopathy Cardiovascular: RRR, No MRG Respiratory: CTA B Musculoskeletal: no deformities Skin: no rashes Neurological: no tremor with outstretched hands  ASSESSMENT: 1. DM2, non-insulin-dependent, uncontrolled, without long-term complications  2. HL  3.  History of hyperthyroidism  PLAN:  1. Patient with longstanding, previously uncontrolled, type 2 diabetes, on oral antidiabetic regimen with meglitinide and previously on weekly injectable GLP-1 receptor agonist but off in the last 2 to 3 weeks before our last visit due to lack of availability.  HbA1c was slightly higher at that time, at 6.8%.  I recommended to try to start [facility].  - I suggested to:  Patient Instructions  Please continue: - Repaglinide 0.5 mg before the 2 main meals of the day - Mounjaro 5 mg weekly  Please return in 4 months with your sugar log.   -  we checked her HbA1c: 7%  - advised to check sugars at different times of the day - 1x a day, rotating check times - advised for yearly eye exams >> she is UTD - return to clinic in 4 months  2. HL -Reviewed latest lipid panel from 07/2021: Fractions at goal: Lab Results  Component Value Date   CHOL 148 08/15/2021   HDL 50.60 08/15/2021   LDLCALC 79 08/15/2021   TRIG 93.0 08/15/2021   CHOLHDL 3 08/15/2021  -She continues lovastatin 20 mg daily without side effects  3.   History of hyperthyroidism -She was previously on treatment (2006) -Now resolved -Latest TSH reviewed and it was normal: Lab Results  Component Value Date   TSH 2.75 04/19/2021   Carlus Pavlov, MD PhD [facility]

## 2022-07-08 ENCOUNTER — Other Ambulatory Visit: Payer: Self-pay | Admitting: Family Medicine

## 2022-07-08 DIAGNOSIS — J309 Allergic rhinitis, unspecified: Secondary | ICD-10-CM

## 2022-07-09 ENCOUNTER — Ambulatory Visit (INDEPENDENT_AMBULATORY_CARE_PROVIDER_SITE_OTHER): Payer: BC Managed Care – PPO | Admitting: Family Medicine

## 2022-07-09 ENCOUNTER — Encounter: Payer: Self-pay | Admitting: Family Medicine

## 2022-07-09 ENCOUNTER — Ambulatory Visit (INDEPENDENT_AMBULATORY_CARE_PROVIDER_SITE_OTHER): Payer: BC Managed Care – PPO

## 2022-07-09 VITALS — BP 108/78 | HR 92 | Ht 72.0 in | Wt 296.0 lb

## 2022-07-09 DIAGNOSIS — M25532 Pain in left wrist: Secondary | ICD-10-CM

## 2022-07-09 NOTE — Patient Instructions (Signed)
Thank you for coming in today.   Check back in 2 weeks 

## 2022-07-09 NOTE — Progress Notes (Signed)
   Rubin Payor, PhD, LAT, ATC acting as a scribe for Clementeen Graham, MD.  Monica [name] is a 47 y.o. female who presents to [facility] at [facility] today for 2-wk f/u L wrist pain after falling out of a hammock. She works in Consulting civil engineer. Pt was last seen by Dr. Denyse Amass on 06/25/22 and was placed in a thumb spica splint and advised to use Voltaren gel. Today, pt reports continued wrist pain and swelling.  She is using the thumb spica splint and notes continued pain especially with pronation and supination of the wrist.  Dx imaging: 06/25/22 L hand & L wrist XR  Pertinent review of systems: No fevers or chills  Relevant historical information: History of trigger thumb.   Exam:  BP 108/78   Pulse 92   Ht 6' (1.829 m)   Wt 296 lb (134.3 kg)   SpO2 97%   BMI 40.14 kg/m  General: Well Developed, well nourished, and in no acute distress.   MSK: Left wrist: Swollen mildly.  Mildly tender palpation near ulnar styloid.  Decreased wrist motion.  Pulses cap refill and sensation are intact distally.    Lab and Radiology Results  X-ray images left wrist obtained today personally and independently interpreted. Avulsion fracture at ulnar aspect of distal radius is similar in appearance to last x-ray May 6.  Notes significant change in location or displacement. Await formal radiology review.  Patient was fitted with a well-formed custom fiberglass sugar-tong splint left arm.  Assessment and Plan: 47 y.o. female with left wrist pain following a fall with concern for avulsion fracture.  There may be a ligament injury as well.  She is having pain with hand pronation supination even with a thumb spica splint.  Will transition to a sugar-tong splint which will immobilize her wrist better.  Will check back again in 2 weeks and see if we can get her back into a more comfortable splint from there.  If needed we may need to perform MRI arthrogram.   PDMP not reviewed this encounter. Orders  Placed This Encounter  Procedures   DG Wrist Complete Left    Standing Status:   Future    Number of Occurrences:   1    Standing Expiration Date:   07/09/2023    Order Specific Question:   Reason for Exam (SYMPTOM  OR DIAGNOSIS REQUIRED)    Answer:   eval left wrist pain    Order Specific Question:   Is patient pregnant?    Answer:   No    Order Specific Question:   Preferred imaging location?    Answer:   [facility]   No orders of the defined types were placed in this encounter.    Discussed warning signs or symptoms. Please see discharge instructions. Patient expresses understanding.   The above documentation has been reviewed and is accurate and complete Clementeen Graham, M.D.

## 2022-07-10 MED ORDER — HYDROCODONE-ACETAMINOPHEN 5-325 MG PO TABS: 1.0000 | ORAL_TABLET | Freq: Four times a day (QID) | ORAL | 0 refills | Status: DC | PRN

## 2022-07-11 NOTE — Progress Notes (Signed)
Left wrist x-ray shows that bone fragment which radiology thinks may be old.  If not improving conservatively we can get an MRI arthrogram to look more accurately of the wrist.  This is an MRI where we inject contrast first.

## 2022-07-23 ENCOUNTER — Ambulatory Visit: Payer: BC Managed Care – PPO | Admitting: Family Medicine

## 2022-07-30 ENCOUNTER — Other Ambulatory Visit: Payer: Self-pay

## 2022-07-30 ENCOUNTER — Ambulatory Visit (INDEPENDENT_AMBULATORY_CARE_PROVIDER_SITE_OTHER): Payer: BC Managed Care – PPO

## 2022-07-30 ENCOUNTER — Encounter: Payer: Self-pay | Admitting: Family Medicine

## 2022-07-30 ENCOUNTER — Ambulatory Visit (INDEPENDENT_AMBULATORY_CARE_PROVIDER_SITE_OTHER): Payer: BC Managed Care – PPO | Admitting: Family Medicine

## 2022-07-30 VITALS — BP 100/64 | HR 87 | Ht 72.0 in | Wt 283.0 lb

## 2022-07-30 DIAGNOSIS — M25532 Pain in left wrist: Secondary | ICD-10-CM | POA: Diagnosis not present

## 2022-07-30 DIAGNOSIS — M79644 Pain in right finger(s): Secondary | ICD-10-CM | POA: Diagnosis not present

## 2022-07-30 MED ORDER — LORAZEPAM 0.5 MG PO TABS: ORAL_TABLET | ORAL | 0 refills | Status: DC

## 2022-07-30 NOTE — Progress Notes (Addendum)
I, Stevenson Clinch, CMA acting as a scribe for Clementeen Graham, MD.  Monica [name] is a 47 y.o. female who presents to [facility] at [facility] today for f/u L wrist painafter falling out of a hammock. She works in Consulting civil engineer. Pt was last seen by Dr. Denyse Amass on 07/09/22 and was was placed in a fiberglass sugar-tong splint. Today, pt reports continued left hand pain. Notes more pain at ulnar aspect of the wrist. Has been immobilizing periodically. Wears when being more active, takes it off when resting. Not currently taking anything for pain. Using ice at night before bedtime. Has tweaked the wrist a couple of time while out of the immobilizer.   Dx imaging: 07/09/22 L wrist XR 06/25/22 L hand & L wrist XR   Pertinent review of systems: No fevers or chills  Relevant historical information: Hypertension and diabetes   Exam:  BP 100/64   Pulse 87   Ht 6' (1.829 m)   Wt 283 lb (128.4 kg)   SpO2 97%   BMI 38.38 kg/m  General: Well Developed, well nourished, and in no acute distress.   MSK: Wrist normal appearing. Tender palpation dorsal and ulnar wrist.  Decreased wrist motion limited by pain.    Lab and Radiology Results  X-ray images left wrist cyst not significantly changed.  No acute fractures are visible.  Small avulsion fragment previously seen is more round indicating is likely to be a chronic issue. Await formal radiology review  Procedure: Real-time Ultrasound Guided Injection of right first MCP A1 pulley trigger finger injection Device: Philips Affiniti 50G Images permanently stored and available for review in PACS Verbal informed consent obtained.  Discussed risks and benefits of procedure. Warned about infection, bleeding, hyperglycemia damage to structures among others. Patient expresses understanding and agreement Time-out conducted.   Noted no overlying erythema, induration, or other signs of local infection.   Skin prepped in a sterile fashion.   Local  anesthesia: Topical Ethyl chloride.   With sterile technique and under real time ultrasound guidance: 40 mg of Kenalog and 1 mL of lidocaine injected into right first A1 pulley. Fluid seen entering the tendon sheath.   Completed without difficulty   Pain immediately resolved suggesting accurate placement of the medication.   Advised to call if fevers/chills, erythema, induration, drainage, or persistent bleeding.   Images permanently stored and available for review in the ultrasound unit.  Impression: Technically successful ultrasound guided injection.     Assessment and Plan: 47 y.o. female with left wrist pain after fall.  No definitive fracture is seen.  I am worried that she may have tendon or ligament tear or perhaps TFCC tear.  TFCC tear and radiocarpal ligament tear are the most 2 likely possibilities.  Plan for MRI arthrogram to further evaluate source of wrist pain.  Trigger finger injection performed for right thumb today.  PDMP not reviewed this encounter. Orders Placed This Encounter  Procedures   DG Wrist Complete Left    Standing Status:   Future    Number of Occurrences:   1    Standing Expiration Date:   08/29/2022    Order Specific Question:   Reason for Exam (SYMPTOM  OR DIAGNOSIS REQUIRED)    Answer:   left wrist pain    Order Specific Question:   Preferred imaging location?    Answer:   [facility]    Order Specific Question:   Is patient pregnant?    Answer:  No   MR WRIST LEFT WO CONTRAST    MRI arthrogram left wrist. NO IV contrast    Standing Status:   Future    Standing Expiration Date:   07/30/2023    Order Specific Question:   What is the patient's sedation requirement?    Answer:   No Sedation    Order Specific Question:   Does the patient have a pacemaker or implanted devices?    Answer:   No    Order Specific Question:   Preferred imaging location?    Answer:   [facility] (table limit-550lbs)   DG FLUORO GUIDED NEEDLE PLC  ASPIRATION/INJECTION LOC    Scheduling Instructions:     MRI arthorgram left wrist    Order Specific Question:   Reason for exam:    Answer:   For use with MR Arthrogram shoulder (also ordered). Wrist    Order Specific Question:   Is the patient pregnant?    Answer:   No    Order Specific Question:   Preferred imaging location?    Answer:   [facility]   [place] LIMITED JOINT SPACE STRUCTURES UP RIGHT(NO LINKED CHARGES)    Order Specific Question:   Reason for Exam (SYMPTOM  OR DIAGNOSIS REQUIRED)    Answer:   right thumb pain    Order Specific Question:   Preferred imaging location?    Answer:   Adult nurse [facility] ordered this encounter  Medications   LORazepam (ATIVAN) 0.5 MG tablet    Sig: 1-2 tabs 30 - 60 min prior to MRI. Do not drive with this medicine.    Dispense:  4 tablet    Refill:  0     Discussed warning signs or symptoms. Please see discharge instructions. Patient expresses understanding.   The above documentation has been reviewed and is accurate and complete Clementeen Graham, M.D.

## 2022-07-30 NOTE — Patient Instructions (Signed)
Thank you for coming in today.   You received an injection today. Seek immediate medical attention if the joint becomes red, extremely painful, or is oozing fluid.   Use the Double Band-Aid splint

## 2022-07-31 ENCOUNTER — Other Ambulatory Visit: Payer: Self-pay | Admitting: Family Medicine

## 2022-07-31 DIAGNOSIS — J4520 Mild intermittent asthma, uncomplicated: Secondary | ICD-10-CM

## 2022-08-01 ENCOUNTER — Other Ambulatory Visit: Payer: Self-pay | Admitting: Family Medicine

## 2022-08-06 ENCOUNTER — Encounter: Payer: Self-pay | Admitting: Family Medicine

## 2022-08-06 NOTE — Telephone Encounter (Signed)
Forwarding to Dr. Corey to advise.  

## 2022-08-06 NOTE — Progress Notes (Signed)
Left wrist x-ray shows chronic changes in the wrist that we have talked about looked at before.  No acute fractures are visible or new.  Proceed to MRI arthrogram as we discussed.

## 2022-08-07 ENCOUNTER — Encounter: Payer: Self-pay | Admitting: Internal Medicine

## 2022-08-07 ENCOUNTER — Ambulatory Visit: Payer: BC Managed Care – PPO | Admitting: Internal Medicine

## 2022-08-07 VITALS — BP 120/72 | HR 81 | Ht 72.0 in | Wt 286.8 lb

## 2022-08-07 DIAGNOSIS — E1165 Type 2 diabetes mellitus with hyperglycemia: Secondary | ICD-10-CM

## 2022-08-07 DIAGNOSIS — Z7984 Long term (current) use of oral hypoglycemic drugs: Secondary | ICD-10-CM

## 2022-08-07 DIAGNOSIS — E785 Hyperlipidemia, unspecified: Secondary | ICD-10-CM

## 2022-08-07 DIAGNOSIS — E119 Type 2 diabetes mellitus without complications: Secondary | ICD-10-CM

## 2022-08-07 DIAGNOSIS — Z7985 Long-term (current) use of injectable non-insulin antidiabetic drugs: Secondary | ICD-10-CM

## 2022-08-07 DIAGNOSIS — E039 Hypothyroidism, unspecified: Secondary | ICD-10-CM | POA: Diagnosis not present

## 2022-08-07 DIAGNOSIS — E1169 Type 2 diabetes mellitus with other specified complication: Secondary | ICD-10-CM

## 2022-08-07 LAB — LIPID PANEL
Cholesterol: 153 mg/dL (ref 0–200)
HDL: 44.9 mg/dL (ref >39.00)
LDL Cholesterol: 93 mg/dL (ref 0–99)
NonHDL: 107.79
Total CHOL/HDL Ratio: 3
Triglycerides: 75 mg/dL (ref 0.0–149.0)
VLDL: 15 mg/dL (ref 0.0–40.0)

## 2022-08-07 LAB — POCT GLYCOSYLATED HEMOGLOBIN (HGB A1C): Hemoglobin A1C: 6.1 % — AB (ref 4.0–5.6)

## 2022-08-07 LAB — TSH: TSH: 2.25 u[IU]/mL (ref 0.35–5.50)

## 2022-08-07 MED ORDER — TIRZEPATIDE 5 MG/0.5ML ~~LOC~~ SOAJ: 5.0000 mg | SUBCUTANEOUS | 3 refills | Status: DC

## 2022-08-07 NOTE — Patient Instructions (Addendum)
Please continue: - Repaglinide 0.5 mg before the 2 main meals of the day - Mounjaro 5 mg weekly  Please stop at the lab.  Please return in 4-6 months with your sugar log.

## 2022-08-07 NOTE — Progress Notes (Signed)
Patient ID: Monica [name], female   DOB: 02-Feb-1976, 47 y.o.   MRN: [id number]  HPI: Monica [name] is a 47 y.o.-year-old female, returning for follow-up for DM2, dx in 2018 (GDM in 1999), non-insulin-dependent, uncontrolled, without long-term complications. Pt. previously saw Dr. Everardo All, but last visit with me 4 months ago.  Interim history: No increased urination, nausea, chest pain.  She does have blurry vision, and has an appointment with ophthalmology coming up in 2 days.  Reviewed HbA1c: Lab Results  Component Value Date   HGBA1C 6.8 (A) 03/05/2022   HGBA1C 6.7 (A) 11/01/2021   HGBA1C 6.9 (A) 04/19/2021   HGBA1C 7.2 (A) 01/18/2021   HGBA1C 7.4 (A) 10/17/2020   HGBA1C 6.5 (A) 06/20/2020   HGBA1C 6.6 (A) 01/08/2020   HGBA1C 7.3 (A) 09/15/2019   HGBA1C 6.9 (A) 07/13/2019   HGBA1C 7.6 (H) 12/26/2018   Pt is on a regimen of: - Repaglinide 0.5 mg 2x a day in am and at bedtime >> before the 2 main meals of the day - Ozempic 2 mg weekly >> Trulicity 3 mg weekly >> off for 2-3 weeks >> Mounjaro 5 mg weekly-tolerated well She tried metformin but this caused abdominal pain. She tried Victoza but this caused nausea. She was also on Trulicity. She tried [place] but this caused vaginitis. She was previously on insulin between 2020 and 2021. She had poor night vision at night on Ozempic.  Pt checks her sugars 0-1x a day and they are: - am: 120s >> 90s, 124, 125 >> 100-120, 130 (if eating late) - 2h after b'fast: n/c - before lunch: n/c >> 140s >> n/c - 2h after lunch: n/c >> 130-170 - before dinner: 130s >> n/c - 2h after dinner: 156 >> n/c >> up to 170 - bedtime: n/c - nighttime: n/c Lowest sugar was 86 >> 90 >> 100; she has hypoglycemia awareness <90.  Highest sugar was 156 >> 400s (Prednisone) >> 170.  Glucometer: One Touch Verio  - no CKD, last BUN/creatinine:  Lab Results  Component Value Date   BUN 17 04/04/2022   BUN 11 03/25/2022   CREATININE 1.08 04/04/2022    CREATININE 1.06 (H) 03/25/2022  On olmesartan 40 mg daily.  -+ HL; last set of lipids: Lab Results  Component Value Date   CHOL 148 08/15/2021   HDL 50.60 08/15/2021   LDLCALC 79 08/15/2021   TRIG 93.0 08/15/2021   CHOLHDL 3 08/15/2021  On lovastatin 20 mg daily.  - last eye exam was 06/24/2021. No DR.   - no numbness and tingling in her feet.  Last foot exam 06/15/2022 by Dr. Eloy End with podiatry.  05/10/2021: CAC score = 0.  She also has a history of HTN, ?OSA, GERD, asthma.  ROS: + see HPI  Past Medical History:  Diagnosis Date   Allergy    Anemia    Asthma    Diabetes mellitus without complication (HCC) 04/2016   GERD (gastroesophageal reflux disease)    Hypertension    OSA (obstructive sleep apnea)    Sleep apnea    no cpap - told no OSA after told did so unsure if + or not    Past Surgical History:  Procedure Laterality Date   ABDOMINAL HYSTERECTOMY  02/19/2006   BICEPS TENDON REPAIR Bilateral    FOOT SURGERY     Left   KNEE ARTHROSCOPY Bilateral    ROTATOR CUFF REPAIR Bilateral    Social History   Socioeconomic History   Marital status:  Married    Spouse name: Not on file   Number of children: 1   Years of education: Not on file   Highest education level: Master's degree (e.g., MA, MS, MEng, MEd, MSW, MBA)  Occupational History   Occupation: Solicitor  Tobacco Use   Smoking status: Every Day    Packs/day: .5    Types: Cigarettes    Last attempt to quit: 11/25/2018    Years since quitting: 3.7   Smokeless tobacco: Never   Tobacco comments:    down to 3 cigarettes per day  Vaping Use   Vaping Use: Never used  Substance and Sexual Activity   Alcohol use: Yes    Comment: socially   Drug use: No   Sexual activity: Not on file  Other Topics Concern   Not on file  Social History Narrative   Not on file   Social Determinants of Health   Financial Resource Strain: Low Risk  (03/09/2021)   Overall Financial Resource Strain  (CARDIA)    Difficulty of Paying Living Expenses: Not very hard  Food Insecurity: No Food Insecurity (03/22/2022)   Hunger Vital Sign    Worried About Running Out of Food in the Last Year: Never true    Ran Out of Food in the Last Year: Never true  Transportation Needs: No Transportation Needs (03/22/2022)   PRAPARE - Administrator, Civil Service (Medical): No    Lack of Transportation (Non-Medical): No  Physical Activity: Insufficiently Active (03/09/2021)   Exercise Vital Sign    Days of Exercise per Week: 4 days    Minutes of Exercise per Session: 30 min  Stress: Stress Concern Present (03/09/2021)   [employer] of Occupational Health - Occupational Stress Questionnaire    Feeling of Stress : To some extent  Social Connections: Unknown (03/09/2021)   Social Connection and Isolation Panel [NHANES]    Frequency of Communication with Friends and Family: More than three times a week    Frequency of Social Gatherings with Friends and Family: Once a week    Attends Religious Services: Patient declined    Database administrator or Organizations: Yes    Attends Banker Meetings: 1 to 4 times per year    Marital Status: Married  Catering manager Violence: Not At Risk (03/22/2022)   Humiliation, Afraid, Rape, and Kick questionnaire    Fear of Current or Ex-Partner: No    Emotionally Abused: No    Physically Abused: No    Sexually Abused: No   Current Outpatient Medications on File Prior to Visit  Medication Sig Dispense Refill   acetaminophen (TYLENOL) 650 MG CR tablet Take 1,300 mg by mouth 2 (two) times daily as needed for pain.     albuterol (VENTOLIN HFA) 108 (90 Base) MCG/ACT inhaler INHALE 2 PUFFS INTO THE LUNGS EVERY 6 HOURS AS NEEDED FOR WHEEZING OR SHORTNESS OF BREATH 8.5 g 3   amLODipine (NORVASC) 2.5 MG tablet Take 1 tablet (2.5 mg total) by mouth daily. 90 tablet 2   ammonium lactate (AMLACTIN) 12 % cream Apply topically as needed for dry skin. 385 g 6    BAYER ASPIRIN EC LOW DOSE 81 MG tablet Take 81 mg by mouth in the morning. Swallow whole.     HYDROcodone-acetaminophen (NORCO/VICODIN) 5-325 MG tablet Take 1 tablet by mouth every 6 (six) hours as needed. 15 tablet 0   ibuprofen (ADVIL) 200 MG tablet Take 400 mg by mouth every 6 (six)  hours as needed for mild pain or headache.     ketoconazole (NIZORAL) 2 % cream Apply to both feet and between toes once daily for 6 weeks. 60 g 1   Lancets (ONETOUCH DELICA PLUS LANCET33G) MISC USE TO TEST BLOOD SUGAR EVERYDAY 100 each 1   levocetirizine (XYZAL) 5 MG tablet TAKE 1 TABLET(5 MG) BY MOUTH EVERY EVENING 90 tablet 2   LORazepam (ATIVAN) 0.5 MG tablet 1-2 tabs 30 - 60 min prior to MRI. Do not drive with this medicine. 4 tablet 0   lovastatin (MEVACOR) 20 MG tablet TAKE 1 TABLET(20 MG) BY MOUTH AT BEDTIME 90 tablet 3   mometasone (NASONEX) 50 MCG/ACT nasal spray SHAKE LIQUID AND USE 2 SPRAYS IN EACH NOSTRIL DAILY 17 g 2   montelukast (SINGULAIR) 10 MG tablet TAKE 1 TABLET(10 MG) BY MOUTH AT BEDTIME 90 tablet 1   olmesartan-hydrochlorothiazide (BENICAR HCT) 40-25 MG tablet TAKE 1 TABLET BY MOUTH DAILY 90 tablet 1   omeprazole (PRILOSEC) 40 MG capsule TAKE 1 CAPSULE(40 MG) BY MOUTH DAILY 90 capsule 1   ONETOUCH VERIO test strip USE TO TEST BLOOD SUGAR EVERY DAY 100 strip 3   phenylephrine (SUDAFED PE) 10 MG TABS tablet Take 10 mg by mouth every 4 (four) hours as needed (WHEN FLYING, TO PREVENT ALTITUDE-RELATED EAR PAIN).     PREVIDENT 0.2 % SOLN 5 mLs by Mouth Rinse route 2 (two) times daily.     repaglinide (PRANDIN) 0.5 MG tablet Take 1 tablet (0.5 mg total) by mouth 2 (two) times daily before a meal. 180 tablet 3   tirzepatide (MOUNJARO) 5 MG/0.5ML Pen Inject 5 mg into the skin once a week. 2 mL 5   No current facility-administered medications on file prior to visit.   Allergies  Allergen Reactions   Codeine Other (See Comments)    Unable to Urinate    Family History  Problem Relation Age of  Onset   Hyperlipidemia Mother    Stroke Mother    Heart disease Mother    Hypertension Mother    Diabetes Mother    Colon polyps Mother        TA+   Hyperlipidemia Father    Heart disease Father    Stroke Father    Hypertension Father    Diabetes Father    Colon cancer Father        Dx'd in his 54's    Diabetes Brother    Hypertension Maternal Grandmother    Diabetes Mellitus II Maternal Grandmother    Colon cancer Paternal Uncle    Esophageal cancer Neg Hx    Rectal cancer Neg Hx    Stomach cancer Neg Hx    PE: BP 120/72   Pulse 81   Ht 6' (1.829 m)   Wt 286 lb 12.8 oz (130.1 kg)   SpO2 97%   BMI 38.90 kg/m  Wt Readings from Last 3 Encounters:  08/07/22 286 lb 12.8 oz (130.1 kg)  07/30/22 283 lb (128.4 kg)  07/09/22 296 lb (134.3 kg)   Constitutional: obese, in NAD Eyes: no exophthalmos ENT:  no thyromegaly, no cervical lymphadenopathy Cardiovascular: RRR, No MRG Respiratory: CTA B Musculoskeletal: no deformities Skin: no rashes Neurological: no tremor with outstretched hands  ASSESSMENT: 1. DM2, non-insulin-dependent, uncontrolled, without long-term complications  2. HL  3.  History of hyperthyroidism  PLAN:  1. Patient with longstanding, previously uncontrolled diabetes, on oral antidiabetic regimen with meglitinide and weekly GLP-1 receptor agonist but off before last visit due  to lack of availability.  At that time, HbA1c was slightly higher, at 6.7%.  Sugars were within target range in the morning but they were higher before lunch off Trulicity.  I suggested addition of Mounjaro if she could find it at the pharmacy.  I recommended 5 mg weekly and continue to repaglinide.  She tolerates Mounjaro well. -At today's visit, sugars are at goal, without hyper or hypoglycemic values.  For now, I did not recommend a change in regimen but at next visit, if sugars continue to improve, we can stop repaglinide or at least use it on an as-needed basis before larger  meals. - I suggested to:  Patient Instructions  Please continue: - Repaglinide 0.5 mg before the 2 main meals of the day - Mounjaro 5 mg weekly  Please return in 4-6 months with your sugar log.   - we checked her HbA1c: 6.1% (lower) - advised to check sugars at different times of the day - 1x a day, rotating check times - advised for yearly eye exams >> she is due - scheduled in 2 days - return to clinic in 4-6 months  2. HL -Reviewed latest lipid panel from 07/2021: Fractions at goal: Lab Results  Component Value Date   CHOL 148 08/15/2021   HDL 50.60 08/15/2021   LDLCALC 79 08/15/2021   TRIG 93.0 08/15/2021   CHOLHDL 3 08/15/2021  -She is on lovastatin 20 mg daily without side effects -She is due for another lipid panel -will check this today  3.  History of hyperthyroidism -Previous treatment-in 2006 -Now  resolved -TFTs were reviewed from 04/2021 and they were normal -Will repeat a TSH now  Component     Latest Ref Rng 08/07/2022  Cholesterol     0 - 200 mg/dL 161   Triglycerides     0.0 - 149.0 mg/dL 09.6   HDL Cholesterol     >39.00 mg/dL 04.54   VLDL     0.0 - 40.0 mg/dL 09.8   LDL (calc)     0 - 99 mg/dL 93   Total CHOL/HDL Ratio 3   NonHDL 107.79   Hemoglobin A1C     4.0 - 5.6 % 6.1 !   TSH     0.35 - 5.50 uIU/mL 2.25   TSH is normal.  The rest of the labs are also at goal.  Carlus Pavlov, MD PhD [facility]

## 2022-08-14 NOTE — Addendum Note (Signed)
Addended by: Rodolph Bong on: 08/14/2022 07:58 AM   Modules accepted: Level of Service

## 2022-08-22 ENCOUNTER — Ambulatory Visit
Admission: RE | Admit: 2022-08-22 | Discharge: 2022-08-22 | Disposition: A | Discharge status: Home / Self Care | Payer: BC Managed Care – PPO | Source: Ambulatory Visit | Attending: Family Medicine | Admitting: Family Medicine

## 2022-08-22 DIAGNOSIS — M25532 Pain in left wrist: Secondary | ICD-10-CM

## 2022-08-22 MED ORDER — IOPAMIDOL (ISOVUE-M 200) INJECTION 41%
2.0000 mL | Freq: Once | INTRAMUSCULAR | Status: AC
  Administered 2022-08-22: 2 mL via INTRA_ARTICULAR

## 2022-09-03 NOTE — Progress Notes (Signed)
Wrist MRI looks normal.  No bad tendinitis or ligament tears or fractures.  Recommend return to clinic to go over the results in full detail and talk about next steps.

## 2022-09-12 NOTE — Progress Notes (Signed)
ACUTE VISIT Chief Complaint  Patient presents with   Anxiety    X 3-4 weeks, seeing therapist 2x a week.   Insomnia    X 2.5 weeks   HPI: Monica [name] is a 47 y.o. female with past medical history significant for DM 2, hypertension, seasonal allergies, GERD,tobacco use disorder, asthma here today complaining of worsening anxiety and depression as described above. She has noted problem since starting therapy for alcohol abuse and marital problems, she was diagnosed with complex PTSD. Describes anxiety as constant, with symptoms of fast heartbeat, feeling like "everything is closing in", "hollow space" in chest. Negative for exertional chest pain, dyspnea, or diaphoresis. Lab Results  Component Value Date   TSH 2.25 08/07/2022   Lab Results  Component Value Date   NA 135 04/04/2022   CL 97 04/04/2022   K 3.7 04/04/2022   CO2 29 04/04/2022   BUN 17 04/04/2022   CREATININE 1.08 04/04/2022   GFR 60.53 04/04/2022   CALCIUM 10.0 04/04/2022   ALBUMIN 3.0 (L) 03/23/2022   GLUCOSE 146 (H) 04/04/2022   History of severe anxiety when younger, but had subsided until recently Currently attending a program for alcohol cessation, states that she was "drinking heavily" every day, sober for 55 days, daily AA meetings.  No previous medications tried for anxiety or depression.   Has taken Lorazepam for MRI procedure which helped calm without drowsiness  She is seeing two therapists, one for individual counseling and another for marriage counseling  She is struggling with sleep, getting only about 2 hours per night. Negative for hx of bipolar disorder. Difficulty focusing, denies racing thoughts.  She is still smoking, has been exercising including boxing at home, using a punching bag     09/14/2022    8:19 AM 06/04/2022    7:00 AM 04/04/2022    8:36 AM  GAD 7 : Generalized Anxiety Score  Nervous, Anxious, on Edge 2 0 3  Control/stop worrying 2 0 3  Worry too much - different  things 2 0 3  Trouble relaxing 2 0 3  Restless 1 0 3  Easily annoyed or irritable 1 0 0  Afraid - awful might happen 1 0 3  Total GAD 7 Score 11 0 18  Anxiety Difficulty Somewhat difficult Not difficult at all Not difficult at all      09/14/2022    8:19 AM 06/04/2022    6:59 AM 04/04/2022    8:36 AM 08/15/2021    8:00 AM 08/15/2021    7:59 AM  Depression screen PHQ 2/9  Decreased Interest 2 0 2 0 0  Down, Depressed, Hopeless 1 0 2 1 1   PHQ - 2 Score 3 0 4 1 1   Altered sleeping 2 0 2 0   Tired, decreased energy 2 2 2 1    Change in appetite 2 2 0 0   Feeling bad or failure about yourself  2 0 0 0   Trouble concentrating 2 0 0 0   Moving slowly or fidgety/restless 1 0 0 0   Suicidal thoughts 0 0 0 0   PHQ-9 Score 14 4 8 2    Difficult doing work/chores Somewhat difficult Somewhat difficult Not difficult at all Somewhat difficult    Review of Systems  Constitutional:  Positive for fatigue. Negative for chills and fever.  Respiratory:  Negative for cough and wheezing.   Gastrointestinal:  Negative for abdominal pain, nausea and vomiting.  Endocrine: Negative for cold intolerance and heat intolerance.  Skin:  Negative for rash.  Allergic/Immunologic: Positive for environmental allergies.  Neurological:  Negative for syncope and weakness.  Psychiatric/Behavioral:  Positive for sleep disturbance. Negative for confusion and hallucinations. The patient is nervous/anxious.   See other pertinent positives and negatives in HPI.  Current Outpatient Medications on File Prior to Visit  Medication Sig Dispense Refill   acetaminophen (TYLENOL) 650 MG CR tablet Take 1,300 mg by mouth 2 (two) times daily as needed for pain.     albuterol (VENTOLIN HFA) 108 (90 Base) MCG/ACT inhaler INHALE 2 PUFFS INTO THE LUNGS EVERY 6 HOURS AS NEEDED FOR WHEEZING OR SHORTNESS OF BREATH 8.5 g 3   amLODipine (NORVASC) 2.5 MG tablet Take 1 tablet (2.5 mg total) by mouth daily. 90 tablet 2   ammonium lactate  (AMLACTIN) 12 % cream Apply topically as needed for dry skin. 385 g 6   BAYER ASPIRIN EC LOW DOSE 81 MG tablet Take 81 mg by mouth in the morning. Swallow whole.     HYDROcodone-acetaminophen (NORCO/VICODIN) 5-325 MG tablet Take 1 tablet by mouth every 6 (six) hours as needed. 15 tablet 0   ibuprofen (ADVIL) 200 MG tablet Take 400 mg by mouth every 6 (six) hours as needed for mild pain or headache.     ketoconazole (NIZORAL) 2 % cream Apply to both feet and between toes once daily for 6 weeks. 60 g 1   Lancets (ONETOUCH DELICA PLUS LANCET33G) MISC USE TO TEST BLOOD SUGAR EVERYDAY 100 each 1   levocetirizine (XYZAL) 5 MG tablet TAKE 1 TABLET(5 MG) BY MOUTH EVERY EVENING 90 tablet 2   LORazepam (ATIVAN) 0.5 MG tablet 1-2 tabs 30 - 60 min prior to MRI. Do not drive with this medicine. 4 tablet 0   lovastatin (MEVACOR) 20 MG tablet TAKE 1 TABLET(20 MG) BY MOUTH AT BEDTIME 90 tablet 3   mometasone (NASONEX) 50 MCG/ACT nasal spray SHAKE LIQUID AND USE 2 SPRAYS IN EACH NOSTRIL DAILY 17 g 2   montelukast (SINGULAIR) 10 MG tablet TAKE 1 TABLET(10 MG) BY MOUTH AT BEDTIME 90 tablet 1   olmesartan-hydrochlorothiazide (BENICAR HCT) 40-25 MG tablet TAKE 1 TABLET BY MOUTH DAILY 90 tablet 1   omeprazole (PRILOSEC) 40 MG capsule TAKE 1 CAPSULE(40 MG) BY MOUTH DAILY 90 capsule 1   ONETOUCH VERIO test strip USE TO TEST BLOOD SUGAR EVERY DAY 100 strip 3   phenylephrine (SUDAFED PE) 10 MG TABS tablet Take 10 mg by mouth every 4 (four) hours as needed (WHEN FLYING, TO PREVENT ALTITUDE-RELATED EAR PAIN).     PREVIDENT 0.2 % SOLN 5 mLs by Mouth Rinse route 2 (two) times daily.     repaglinide (PRANDIN) 0.5 MG tablet Take 1 tablet (0.5 mg total) by mouth 2 (two) times daily before a meal. 180 tablet 3   tirzepatide (MOUNJARO) 5 MG/0.5ML Pen Inject 5 mg into the skin once a week. 6 mL 3   No current facility-administered medications on file prior to visit.    Past Medical History:  Diagnosis Date   Allergy    Anemia     Asthma    Diabetes mellitus without complication (HCC) 04/2016   GERD (gastroesophageal reflux disease)    Hypertension    OSA (obstructive sleep apnea)    Sleep apnea    no cpap - told no OSA after told did so unsure if + or not    Allergies  Allergen Reactions   Codeine Other (See Comments)    Unable to Urinate  Social History   Socioeconomic History   Marital status: Married    Spouse name: Not on file   Number of children: 1   Years of education: Not on file   Highest education level: Master's degree (e.g., MA, MS, MEng, MEd, MSW, MBA)  Occupational History   Occupation: Solicitor  Tobacco Use   Smoking status: Every Day    Current packs/day: 0.00    Types: Cigarettes    Last attempt to quit: 11/25/2018    Years since quitting: 3.8   Smokeless tobacco: Never   Tobacco comments:    down to 3 cigarettes per day  Vaping Use   Vaping status: Never Used  Substance and Sexual Activity   Alcohol use: Yes    Comment: socially   Drug use: No   Sexual activity: Not on file  Other Topics Concern   Not on file  Social History Narrative   Not on file   Social Determinants of Health   Financial Resource Strain: Low Risk  (09/11/2022)   Overall Financial Resource Strain (CARDIA)    Difficulty of Paying Living Expenses: Not very hard  Food Insecurity: No Food Insecurity (09/11/2022)   Hunger Vital Sign    Worried About Running Out of Food in the Last Year: Never true    Ran Out of Food in the Last Year: Never true  Transportation Needs: No Transportation Needs (09/11/2022)   PRAPARE - Administrator, Civil Service (Medical): No    Lack of Transportation (Non-Medical): No  Physical Activity: Insufficiently Active (09/11/2022)   Exercise Vital Sign    Days of Exercise per Week: 3 days    Minutes of Exercise per Session: 30 min  Stress: Stress Concern Present (09/11/2022)   [employer] of Occupational Health - Occupational Stress  Questionnaire    Feeling of Stress : Rather much  Social Connections: Unknown (09/11/2022)   Social Connection and Isolation Panel [NHANES]    Frequency of Communication with Friends and Family: Once a week    Frequency of Social Gatherings with Friends and Family: More than three times a week    Attends Religious Services: Never    Database administrator or Organizations: Yes    Attends Banker Meetings: More than 4 times per year    Marital Status: Patient declined    Vitals:   09/14/22 0812  BP: 100/70  Pulse: 91  Resp: 12  SpO2: 99%   Body mass index is 40.5 kg/m.  Physical Exam Vitals and nursing note reviewed.  Constitutional:      General: She is not in acute distress.    Appearance: She is well-developed.  HENT:     Head: Normocephalic and atraumatic.  Eyes:     Conjunctiva/sclera: Conjunctivae normal.  Cardiovascular:     Rate and Rhythm: Normal rate and regular rhythm.     Heart sounds: No murmur heard. Pulmonary:     Effort: Pulmonary effort is normal. No respiratory distress.     Breath sounds: Normal breath sounds.  Skin:    General: Skin is warm.     Findings: No erythema or rash.  Neurological:     General: No focal deficit present.     Mental Status: She is alert and oriented to person, place, and time.     Gait: Gait normal.  Psychiatric:        Mood and Affect: Mood is depressed. Mood is not anxious.  Thought Content: Thought content does not include homicidal or suicidal ideation. Thought content does not include homicidal or suicidal plan.   ASSESSMENT AND PLAN:  Ms. Montavon was seen today for worsening anxiety, depression, and insomnia.  Insomnia, unspecified type Assessment & Plan: We discussed possible etiologies, most likely related to underlying psychiatrist disorder like anxiety,depression,and alcohol dependency (in remission). Paroxetine and hydralazine started today to treat anxiety and depression, these medications  may also help with sleep. Continue adequate sleep hygiene and CBT.   Anxiety disorder, unspecified type Assessment & Plan: We discussed pharmacologic treatment options, reports recent diagnosed with PTSD. She agrees with trying paroxetine 20 mg, she can start with 1/2 tablet daily for a week and then increase it to 1 tablet daily. Hydroxyzine 25 mg to take 1 to 2 tablets at night as needed recommended for acute anxiety.  Instructed to hold her xyzal 5 mg when she takes Hydroxyzine. We discussed some side effects of medication. Continue CBT. Follow-up in 6 weeks, before if needed.  Orders: -     PARoxetine HCl; Take 1 tablet (20 mg total) by mouth daily.  Dispense: 30 tablet; Refill: 1 -     hydrOXYzine Pamoate; Take 1-2 capsules (25-50 mg total) by mouth at bedtime as needed.  Dispense: 30 capsule; Refill: 1  Moderate episode of recurrent major depressive disorder (HCC) Assessment & Plan: Paroxetine 20 mg started today. We discussed side effects. Currently on CBT. Instructed about warning signs. Follow-up in 6 weeks, before if needed.  Orders: -     PARoxetine HCl; Take 1 tablet (20 mg total) by mouth daily.  Dispense: 30 tablet; Refill: 1   Return in about 6 weeks (around 10/26/2022).  Varick Keys G. [place], MD  [facility] [place]. [place] office.

## 2022-09-13 ENCOUNTER — Encounter: Payer: Self-pay | Admitting: Family Medicine

## 2022-09-13 ENCOUNTER — Ambulatory Visit (INDEPENDENT_AMBULATORY_CARE_PROVIDER_SITE_OTHER): Payer: BC Managed Care – PPO | Admitting: Family Medicine

## 2022-09-13 VITALS — BP 102/60 | HR 78 | Ht 72.0 in | Wt 298.6 lb

## 2022-09-13 DIAGNOSIS — M25532 Pain in left wrist: Secondary | ICD-10-CM

## 2022-09-13 NOTE — Patient Instructions (Signed)
Thank you for coming in today.   Plan for Occupational Therapy.   Let me know if this is not working.  Next step is likely an injection.   Let me know how this goes.

## 2022-09-13 NOTE — Progress Notes (Signed)
I, Stevenson Clinch, CMA acting as a scribe for Clementeen Graham, MD.  Monica [name] is a 47 y.o. female who presents to [facility] at [facility] today for f/u L wrist/thumb pain w/ MRI arthrogram review. She works in Consulting civil engineer. Pt was last seen by Dr. Denyse Amass on 07/30/22 and was given a R 1st trigger finger steroid injection and a MRI arthrogram was ordered to further evaluate the source of her wrist pain.  Today, pt reports continued pain in the left wrist. Thumb pain has improved. No longer wearing brace. Denies new or worsening sx since last visit. MRI review today.   Dx imaging: 08/22/22 L wrist MRI arthrogram 07/30/22 L wrist XR 07/09/22 L wrist XR 06/25/22 L hand & L wrist XR   Pertinent review of systems: No fevers or chills  Relevant historical information: Right-hand-dominant   Exam:  BP 102/60   Pulse 78   Ht 6' (1.829 m)   Wt 298 lb 9.6 oz (135.4 kg)   SpO2 98%   BMI 40.50 kg/m  General: Well Developed, well nourished, and in no acute distress.   MSK: Left wrist normal appearing Tender palpation ulnar dorsal and volar wrist.  Normal wrist motion.    Lab and Radiology Results   EXAM: MRI OF THE LEFT WRIST WITH CONTRAST (MR Arthrogram)   TECHNIQUE: Multiplanar, multisequence MR imaging of the wrist was performed immediately following contrast injection into the radiocarpal joint under fluoroscopic guidance. No intravenous contrast was administered.   COMPARISON:  Injection images same date.  Radiographs 07/30/2022   FINDINGS: Ligaments: The scapholunate and lunotriquetral ligaments appear intact. There is no contrast extension into the midcarpal compartment.   Triangular fibrocartilage: The triangular fibrocartilage complex appears intact. The ulnar variance is negative. There is associated thickening of the TFC disc. There is no contrast or significant fluid in the distal radioulnar compartment.   Tendons: Contrast within the 2nd and 3rd extensor  compartment tendon sheaths is attributed to the injection. The extensor and flexor tendons otherwise appear unremarkable.   Carpal tunnel/median nerve: Unremarkable.   Guyon's canal: Unremarkable.   Joint/cartilage: The radiocarpal compartment is well distended with contrast. No focal chondral defects are identified. There is no extension of contrast into or other significant fluid within the midcarpal or distal radioulnar compartments.   Bones/carpal alignment: The carpal bone alignment is normal.There are no significant extra-articular osseous findings.   Other: Dorsal subcutaneous edema attributed to the injection. No focal periarticular fluid collections are demonstrated.   IMPRESSION: 1. No acute findings or explanation for the patient's symptoms. 2. The triangular fibrocartilage complex, scapholunate and lunotriquetral ligaments appear intact. 3. No acute osseous findings or significant arthropathic changes. 4. Ulnar minus variance without evidence of Kienbock's disease.     Electronically Signed   By: Carey Bullocks M.D.   On: 08/31/2022 19:57.xray      Assessment and Plan: 46 y.o. female with left wrist pain occurring after falling out of a hammock few months ago. MRI arthrogram is surprisingly normal.  She does have pain and tenderness so something is wrong. Plan for trial of occupational therapy.  If that does not work well enough we can try cortisone injection. She will keep me updated how she feels.   PDMP not reviewed this encounter. Orders Placed This Encounter  Procedures   Ambulatory referral to Occupational Therapy    Referral Priority:   Routine    Referral Type:   Occupational Therapy    Referral Reason:  Specialty Services Required    Requested Specialty:   Occupational Therapy    Number of Visits Requested:   1   No orders of the defined types were placed in this encounter.    Discussed warning signs or symptoms. Please see discharge  instructions. Patient expresses understanding.   The above documentation has been reviewed and is accurate and complete Clementeen Graham, M.D.

## 2022-09-14 ENCOUNTER — Encounter: Payer: Self-pay | Admitting: Family Medicine

## 2022-09-14 ENCOUNTER — Ambulatory Visit: Payer: BC Managed Care – PPO | Admitting: Family Medicine

## 2022-09-14 VITALS — BP 100/70 | HR 91 | Resp 12 | Ht 72.0 in | Wt 298.6 lb

## 2022-09-14 DIAGNOSIS — G4700 Insomnia, unspecified: Secondary | ICD-10-CM | POA: Diagnosis not present

## 2022-09-14 DIAGNOSIS — F419 Anxiety disorder, unspecified: Secondary | ICD-10-CM | POA: Diagnosis not present

## 2022-09-14 DIAGNOSIS — F331 Major depressive disorder, recurrent, moderate: Secondary | ICD-10-CM | POA: Diagnosis not present

## 2022-09-14 MED ORDER — PAROXETINE HCL 20 MG PO TABS
20.0000 mg | ORAL_TABLET | Freq: Every day | ORAL | 1 refills | Status: DC
Start: 2022-09-14 — End: 2022-11-02

## 2022-09-14 MED ORDER — HYDROXYZINE PAMOATE 25 MG PO CAPS
25.0000 mg | ORAL_CAPSULE | Freq: Every evening | ORAL | 1 refills | Status: DC | PRN
Start: 2022-09-14 — End: 2022-11-02

## 2022-09-14 NOTE — Assessment & Plan Note (Addendum)
We discussed pharmacologic treatment options, reports recent diagnosed with PTSD. She agrees with trying paroxetine 20 mg, she can start with 1/2 tablet daily for a week and then increase it to 1 tablet daily. Hydroxyzine 25 mg to take 1 to 2 tablets at night as needed recommended for acute anxiety.  Instructed to hold her xyzal 5 mg when she takes Hydroxyzine. We discussed some side effects of medication. Continue CBT. Follow-up in 6 weeks, before if needed.

## 2022-09-14 NOTE — Patient Instructions (Addendum)
A few things to remember from today's visit:  Anxiety disorder, unspecified type - Plan: PARoxetine (PAXIL) 20 MG tablet, hydrOXYzine (VISTARIL) 25 MG capsule  Moderate episode of recurrent major depressive disorder (HCC) - Plan: PARoxetine (PAXIL) 20 MG tablet  Insomnia, unspecified type  Today we started Paroxetine, this type of medications can increase suicidal risk. This is more prevalent among children,adolecents, and young adults with major depression or other psychiatric disorders. It can also make depression worse. Most common side effects are gastrointestinal, self limited after a few weeks: diarrhea, nausea, constipation  Or diarrhea among some.  In general it is well tolerated. We will follow closely.  Hydroxyzine at bedtime as needed, hold on Xyzal when taking it.  If you need refills for medications you take chronically, please call your pharmacy. Do not use My Chart to request refills or for acute issues that need immediate attention. If you send a my chart message, it may take a few days to be addressed, specially if I am not in the office.  Please be sure medication list is accurate. If a new problem present, please set up appointment sooner than planned today.

## 2022-09-14 NOTE — Assessment & Plan Note (Signed)
Paroxetine 20 mg started today. We discussed side effects. Currently on CBT. Instructed about warning signs. Follow-up in 6 weeks, before if needed.

## 2022-09-14 NOTE — Assessment & Plan Note (Addendum)
We discussed possible etiologies, most likely related to underlying psychiatrist disorder like anxiety,depression,and alcohol dependency (in remission). Paroxetine and hydralazine started today to treat anxiety and depression, these medications may also help with sleep. Continue adequate sleep hygiene and CBT.

## 2022-09-24 ENCOUNTER — Encounter: Payer: BC Managed Care – PPO | Admitting: Podiatry

## 2022-09-24 NOTE — Progress Notes (Signed)
Patient was a no-show for today's scheduled appointment.

## 2022-09-28 ENCOUNTER — Encounter: Payer: BC Managed Care – PPO | Admitting: Rehabilitative and Restorative Service Providers"

## 2022-10-02 ENCOUNTER — Encounter: Payer: Self-pay | Admitting: Family Medicine

## 2022-10-04 ENCOUNTER — Encounter (INDEPENDENT_AMBULATORY_CARE_PROVIDER_SITE_OTHER): Payer: Self-pay

## 2022-10-07 ENCOUNTER — Other Ambulatory Visit: Payer: Self-pay | Admitting: Family Medicine

## 2022-10-07 DIAGNOSIS — J309 Allergic rhinitis, unspecified: Secondary | ICD-10-CM

## 2022-10-19 ENCOUNTER — Other Ambulatory Visit: Payer: Self-pay | Admitting: Family Medicine

## 2022-10-19 DIAGNOSIS — F419 Anxiety disorder, unspecified: Secondary | ICD-10-CM

## 2022-10-19 DIAGNOSIS — F331 Major depressive disorder, recurrent, moderate: Secondary | ICD-10-CM

## 2022-10-24 NOTE — Progress Notes (Deleted)
HPI:  [name] is a 48 y.o. female, who is here today to follow on recent visit.  Review of Systems See other pertinent positives and negatives in HPI.  Current Outpatient Medications on File Prior to Visit  Medication Sig Dispense Refill   acetaminophen (TYLENOL) 650 MG CR tablet Take 1,300 mg by mouth 2 (two) times daily as needed for pain.     albuterol (VENTOLIN HFA) 108 (90 Base) MCG/ACT inhaler INHALE 2 PUFFS INTO THE LUNGS EVERY 6 HOURS AS NEEDED FOR WHEEZING OR SHORTNESS OF BREATH 8.5 g 3   amLODipine (NORVASC) 2.5 MG tablet Take 1 tablet (2.5 mg total) by mouth daily. 90 tablet 2   ammonium lactate (AMLACTIN) 12 % cream Apply topically as needed for dry skin. 385 g 6   BAYER ASPIRIN EC LOW DOSE 81 MG tablet Take 81 mg by mouth in the morning. Swallow whole.     HYDROcodone-acetaminophen (NORCO/VICODIN) 5-325 MG tablet Take 1 tablet by mouth every 6 (six) hours as needed. 15 tablet 0   hydrOXYzine (VISTARIL) 25 MG capsule Take 1-2 capsules (25-50 mg total) by mouth at bedtime as needed. 30 capsule 1   ibuprofen (ADVIL) 200 MG tablet Take 400 mg by mouth every 6 (six) hours as needed for mild pain or headache.     ketoconazole (NIZORAL) 2 % cream Apply to both feet and between toes once daily for 6 weeks. 60 g 1   Lancets (ONETOUCH DELICA PLUS LANCET33G) MISC USE TO TEST BLOOD SUGAR EVERYDAY 100 each 1   levocetirizine (XYZAL) 5 MG tablet TAKE 1 TABLET(5 MG) BY MOUTH EVERY EVENING 90 tablet 2   LORazepam (ATIVAN) 0.5 MG tablet 1-2 tabs 30 - 60 min prior to MRI. Do not drive with this medicine. 4 tablet 0   lovastatin (MEVACOR) 20 MG tablet TAKE 1 TABLET(20 MG) BY MOUTH AT BEDTIME 90 tablet 3   mometasone (NASONEX) 50 MCG/ACT nasal spray SHAKE LIQUID AND USE 2 SPRAYS IN EACH NOSTRIL DAILY 17 g 2   montelukast (SINGULAIR) 10 MG tablet TAKE 1 TABLET(10 MG) BY MOUTH AT BEDTIME 90 tablet 1   olmesartan-hydrochlorothiazide (BENICAR HCT) 40-25 MG tablet TAKE 1 TABLET BY MOUTH  DAILY 90 tablet 1   omeprazole (PRILOSEC) 40 MG capsule TAKE 1 CAPSULE(40 MG) BY MOUTH DAILY 90 capsule 1   ONETOUCH VERIO test strip USE TO TEST BLOOD SUGAR EVERY DAY 100 strip 3   PARoxetine (PAXIL) 20 MG tablet Take 1 tablet (20 mg total) by mouth daily. 30 tablet 1   phenylephrine (SUDAFED PE) 10 MG TABS tablet Take 10 mg by mouth every 4 (four) hours as needed (WHEN FLYING, TO PREVENT ALTITUDE-RELATED EAR PAIN).     PREVIDENT 0.2 % SOLN 5 mLs by Mouth Rinse route 2 (two) times daily.     repaglinide (PRANDIN) 0.5 MG tablet Take 1 tablet (0.5 mg total) by mouth 2 (two) times daily before a meal. 180 tablet 3   tirzepatide (MOUNJARO) 5 MG/0.5ML Pen Inject 5 mg into the skin once a week. 6 mL 3   No current facility-administered medications on file prior to visit.    Past Medical History:  Diagnosis Date   Allergy    Anemia    Asthma    Diabetes mellitus without complication (HCC) 04/2016   GERD (gastroesophageal reflux disease)    Hypertension    OSA (obstructive sleep apnea)    Sleep apnea    no cpap - told no OSA after told did  so unsure if + or not    Allergies  Allergen Reactions   Codeine Other (See Comments)    Unable to Urinate     Social History   Socioeconomic History   Marital status: Married    Spouse name: Not on file   Number of children: 1   Years of education: Not on file   Highest education level: Master's degree (e.g., MA, MS, MEng, MEd, MSW, MBA)  Occupational History   Occupation: Solicitor  Tobacco Use   Smoking status: Every Day    Current packs/day: 0.00    Types: Cigarettes    Last attempt to quit: 11/25/2018    Years since quitting: 3.9   Smokeless tobacco: Never   Tobacco comments:    down to 3 cigarettes per day  Vaping Use   Vaping status: Never Used  Substance and Sexual Activity   Alcohol use: Yes    Comment: socially   Drug use: No   Sexual activity: Not on file  Other Topics Concern   Not on file  Social  History Narrative   Not on file   Social Determinants of Health   Financial Resource Strain: Low Risk  (09/11/2022)   Overall Financial Resource Strain (CARDIA)    Difficulty of Paying Living Expenses: Not very hard  Food Insecurity: No Food Insecurity (09/11/2022)   Hunger Vital Sign    Worried About Running Out of Food in the Last Year: Never true    Ran Out of Food in the Last Year: Never true  Transportation Needs: No Transportation Needs (09/11/2022)   PRAPARE - Administrator, Civil Service (Medical): No    Lack of Transportation (Non-Medical): No  Physical Activity: Insufficiently Active (09/11/2022)   Exercise Vital Sign    Days of Exercise per Week: 3 days    Minutes of Exercise per Session: 30 min  Stress: Stress Concern Present (09/11/2022)   [employer] of Occupational Health - Occupational Stress Questionnaire    Feeling of Stress : Rather much  Social Connections: Unknown (09/11/2022)   Social Connection and Isolation Panel [NHANES]    Frequency of Communication with Friends and Family: Once a week    Frequency of Social Gatherings with Friends and Family: More than three times a week    Attends Religious Services: Never    Database administrator or Organizations: Yes    Attends Engineer, structural: More than 4 times per year    Marital Status: Patient declined    There were no vitals filed for this visit. There is no height or weight on file to calculate BMI.  Physical Exam  ASSESSMENT AND PLAN:  There are no diagnoses linked to this encounter.  No orders of the defined types were placed in this encounter.   No problem-specific Assessment & Plan notes found for this encounter.   No follow-ups on file.  Betty G. [place], MD  [facility]. [place] office.

## 2022-10-26 ENCOUNTER — Ambulatory Visit: Payer: BC Managed Care – PPO | Admitting: Family Medicine

## 2022-10-31 ENCOUNTER — Other Ambulatory Visit: Payer: Self-pay | Admitting: Family Medicine

## 2022-10-31 DIAGNOSIS — K219 Gastro-esophageal reflux disease without esophagitis: Secondary | ICD-10-CM

## 2022-11-01 ENCOUNTER — Other Ambulatory Visit: Payer: Self-pay | Admitting: Family Medicine

## 2022-11-01 DIAGNOSIS — F331 Major depressive disorder, recurrent, moderate: Secondary | ICD-10-CM

## 2022-11-01 DIAGNOSIS — F419 Anxiety disorder, unspecified: Secondary | ICD-10-CM

## 2022-11-05 ENCOUNTER — Encounter: Payer: Self-pay | Admitting: Internal Medicine

## 2022-11-06 ENCOUNTER — Telehealth: Payer: Self-pay

## 2022-11-06 NOTE — Telephone Encounter (Signed)
Patient needs a PA for [facility]

## 2022-11-07 ENCOUNTER — Telehealth: Payer: Self-pay

## 2022-11-07 NOTE — Telephone Encounter (Signed)
Pharmacy Patient Advocate Encounter   Received notification from Pt Calls Messages that prior authorization for Monica [name] is required/requested.   Per test claim: PA required; PA submitted to [facility] via CoverMyMeds Key/confirmation #/EOC [facility] Status is pending

## 2022-11-12 ENCOUNTER — Telehealth: Payer: Self-pay | Admitting: Family Medicine

## 2022-11-12 NOTE — Telephone Encounter (Signed)
Patient called stating she called [phone] regarding her referral and was told they do not have a referral for patient for   Referred To  [facility]  Psychiatry    Please advise patient at [phone].

## 2022-11-14 NOTE — Telephone Encounter (Signed)
Pharmacy Patient Advocate Encounter  Received notification from [facility] that Prior Authorization for Monica [name] has been APPROVED through 11/05/2025   PA #/Case ID/Reference #:  [id number]

## 2022-11-21 NOTE — Telephone Encounter (Signed)
Spoke with scheduler and they will be reaching out to pt to schedule.

## 2022-12-07 ENCOUNTER — Ambulatory Visit: Payer: BC Managed Care – PPO | Admitting: Family Medicine

## 2022-12-10 ENCOUNTER — Ambulatory Visit (INDEPENDENT_AMBULATORY_CARE_PROVIDER_SITE_OTHER): Payer: BC Managed Care – PPO | Admitting: Internal Medicine

## 2022-12-10 ENCOUNTER — Encounter: Payer: Self-pay | Admitting: Internal Medicine

## 2022-12-10 VITALS — BP 120/70 | HR 81 | Ht 72.0 in | Wt 292.0 lb

## 2022-12-10 DIAGNOSIS — Z7985 Long-term (current) use of injectable non-insulin antidiabetic drugs: Secondary | ICD-10-CM

## 2022-12-10 DIAGNOSIS — E1169 Type 2 diabetes mellitus with other specified complication: Secondary | ICD-10-CM | POA: Diagnosis not present

## 2022-12-10 DIAGNOSIS — E785 Hyperlipidemia, unspecified: Secondary | ICD-10-CM

## 2022-12-10 DIAGNOSIS — E1165 Type 2 diabetes mellitus with hyperglycemia: Secondary | ICD-10-CM

## 2022-12-10 DIAGNOSIS — E039 Hypothyroidism, unspecified: Secondary | ICD-10-CM

## 2022-12-10 LAB — MICROALBUMIN / CREATININE URINE RATIO
Creatinine,U: 189.3 mg/dL
Microalb Creat Ratio: 1.3 mg/g (ref 0.0–30.0)
Microalb, Ur: 2.5 mg/dL — ABNORMAL HIGH (ref 0.0–1.9)

## 2022-12-10 LAB — POCT GLYCOSYLATED HEMOGLOBIN (HGB A1C): Hemoglobin A1C: 5.7 % — AB (ref 4.0–5.6)

## 2022-12-10 NOTE — Addendum Note (Signed)
Addended by: Pollie Meyer on: 12/10/2022 11:38 AM   Modules accepted: Orders

## 2022-12-10 NOTE — Patient Instructions (Addendum)
Please continue: - Mounjaro 5 mg weekly  Please return in 4-6 months with your sugar log.

## 2022-12-10 NOTE — Progress Notes (Signed)
Patient ID: Monica [name], female   DOB: 15-May-1975, 47 y.o.   MRN: [id number]  HPI: Monica [name] is a 47 y.o.-year-old female, returning for follow-up for DM2, dx in 2018 (GDM in 1999), non-insulin-dependent, uncontrolled, without long-term complications. Pt. previously saw Dr. Everardo All, but last visit with me 4 months ago.  Interim history: No increased urination, nausea, chest pain.  She has blurry vision - she saw ophthalmology and they recommended to use reading glasses. She had gastroenteritis with Salmonella and was admitted at the beginning of the year.  She slowly recovered afterwards. She then stopped alcohol 5 mo ago.  Reviewed HbA1c: Lab Results  Component Value Date   HGBA1C 6.1 (A) 08/07/2022   HGBA1C 6.8 (A) 03/05/2022   HGBA1C 6.7 (A) 11/01/2021   HGBA1C 6.9 (A) 04/19/2021   HGBA1C 7.2 (A) 01/18/2021   HGBA1C 7.4 (A) 10/17/2020   HGBA1C 6.5 (A) 06/20/2020   HGBA1C 6.6 (A) 01/08/2020   HGBA1C 7.3 (A) 09/15/2019   HGBA1C 6.9 (A) 07/13/2019   Pt is on a regimen of: - >> off x 1 mo (ran out) - Ozempic 2 mg weekly >> Trulicity 3 mg weekly >> off for 2-3 weeks >> Mounjaro 5 mg weekly-tolerated well She tried metformin but this caused abdominal pain. She tried Victoza but this caused nausea. She was also on Trulicity. She tried [place] but this caused vaginitis. She was previously on insulin between 2020 and 2021. She had poor night vision at night on Ozempic.  Pt checks her sugars 0-1x a day and they are: - am: 120s >> 90s, 124, 125 >> 100-120, 130 (if eating late) >> 100-120 - 2h after b'fast: n/c - before lunch: n/c >> 140s >> n/c - 2h after lunch: n/c >> 130-170 - before dinner: 130s >> n/c - 2h after dinner: 156 >> n/c >> up to 170 >> 130-140, 190 - bedtime: n/c - nighttime: n/c Lowest sugar was 86 >> 90 >> 100 >> 100; she has hypoglycemia awareness <90.  Highest sugar was 400s (Prednisone) >> 170 >> 190.  Glucometer: One Touch Verio  - no CKD, last  BUN/creatinine:  Lab Results  Component Value Date   BUN 17 04/04/2022   BUN 11 03/25/2022   CREATININE 1.08 04/04/2022   CREATININE 1.06 (H) 03/25/2022   Lab Results  Component Value Date   MICRALBCREAT 1.8 01/05/2022   MICRALBCREAT 0.9 12/26/2018   MICRALBCREAT 0.6 08/07/2017   MICRALBCREAT 0.6 08/13/2016  On olmesartan 40 mg daily.  -+ HL; last set of lipids: Lab Results  Component Value Date   CHOL 153 08/07/2022   HDL 44.90 08/07/2022   LDLCALC 93 08/07/2022   TRIG 75.0 08/07/2022   CHOLHDL 3 08/07/2022  On lovastatin 20 mg daily.  - last eye exam was 07/2022. No DR.   - no numbness and tingling in her feet.  Last foot exam 06/15/2022 by Dr. Eloy End with podiatry.  05/10/2021: CAC score = 0.  She also has a history of HTN, ?OSA, GERD, asthma.  ROS: + see HPI  Past Medical History:  Diagnosis Date   Allergy    Anemia    Asthma    Diabetes mellitus without complication (HCC) 04/2016   GERD (gastroesophageal reflux disease)    Hypertension    OSA (obstructive sleep apnea)    Sleep apnea    no cpap - told no OSA after told did so unsure if + or not    Past Surgical History:  Procedure Laterality Date  ABDOMINAL HYSTERECTOMY  02/19/2006   BICEPS TENDON REPAIR Bilateral    FOOT SURGERY     Left   KNEE ARTHROSCOPY Bilateral    ROTATOR CUFF REPAIR Bilateral    Social History   Socioeconomic History   Marital status: Married    Spouse name: Not on file   Number of children: 1   Years of education: Not on file   Highest education level: Master's degree (e.g., MA, MS, MEng, MEd, MSW, MBA)  Occupational History   Occupation: Solicitor  Tobacco Use   Smoking status: Every Day    Current packs/day: 0.00    Types: Cigarettes    Last attempt to quit: 11/25/2018    Years since quitting: 4.0   Smokeless tobacco: Never   Tobacco comments:    down to 3 cigarettes per day  Vaping Use   Vaping status: Never Used  Substance and Sexual  Activity   Alcohol use: Yes    Comment: socially   Drug use: No   Sexual activity: Not on file  Other Topics Concern   Not on file  Social History Narrative   Not on file   Social Determinants of Health   Financial Resource Strain: Low Risk  (09/11/2022)   Overall Financial Resource Strain (CARDIA)    Difficulty of Paying Living Expenses: Not very hard  Food Insecurity: No Food Insecurity (09/11/2022)   Hunger Vital Sign    Worried About Running Out of Food in the Last Year: Never true    Ran Out of Food in the Last Year: Never true  Transportation Needs: No Transportation Needs (09/11/2022)   PRAPARE - Administrator, Civil Service (Medical): No    Lack of Transportation (Non-Medical): No  Physical Activity: Insufficiently Active (09/11/2022)   Exercise Vital Sign    Days of Exercise per Week: 3 days    Minutes of Exercise per Session: 30 min  Stress: Stress Concern Present (09/11/2022)   [employer] of Occupational Health - Occupational Stress Questionnaire    Feeling of Stress : Rather much  Social Connections: Unknown (09/11/2022)   Social Connection and Isolation Panel [NHANES]    Frequency of Communication with Friends and Family: Once a week    Frequency of Social Gatherings with Friends and Family: More than three times a week    Attends Religious Services: Never    Database administrator or Organizations: Yes    Attends Engineer, structural: More than 4 times per year    Marital Status: Patient declined  Intimate Partner Violence: Not At Risk (03/22/2022)   Humiliation, Afraid, Rape, and Kick questionnaire    Fear of Current or Ex-Partner: No    Emotionally Abused: No    Physically Abused: No    Sexually Abused: No   Current Outpatient Medications on File Prior to Visit  Medication Sig Dispense Refill   acetaminophen (TYLENOL) 650 MG CR tablet Take 1,300 mg by mouth 2 (two) times daily as needed for pain.     albuterol (VENTOLIN HFA) 108 (90  Base) MCG/ACT inhaler INHALE 2 PUFFS INTO THE LUNGS EVERY 6 HOURS AS NEEDED FOR WHEEZING OR SHORTNESS OF BREATH 8.5 g 3   amLODipine (NORVASC) 2.5 MG tablet Take 1 tablet (2.5 mg total) by mouth daily. 90 tablet 2   ammonium lactate (AMLACTIN) 12 % cream Apply topically as needed for dry skin. 385 g 6   BAYER ASPIRIN EC LOW DOSE 81 MG tablet Take 81 mg  by mouth in the morning. Swallow whole.     HYDROcodone-acetaminophen (NORCO/VICODIN) 5-325 MG tablet Take 1 tablet by mouth every 6 (six) hours as needed. 15 tablet 0   hydrOXYzine (VISTARIL) 25 MG capsule TAKE 1 TO 2 CAPSULES(25 TO 50 MG) BY MOUTH AT BEDTIME AS NEEDED 30 capsule 1   ibuprofen (ADVIL) 200 MG tablet Take 400 mg by mouth every 6 (six) hours as needed for mild pain or headache.     ketoconazole (NIZORAL) 2 % cream Apply to both feet and between toes once daily for 6 weeks. 60 g 1   Lancets (ONETOUCH DELICA PLUS LANCET33G) MISC USE TO TEST BLOOD SUGAR EVERYDAY 100 each 1   levocetirizine (XYZAL) 5 MG tablet TAKE 1 TABLET(5 MG) BY MOUTH EVERY EVENING 90 tablet 2   LORazepam (ATIVAN) 0.5 MG tablet 1-2 tabs 30 - 60 min prior to MRI. Do not drive with this medicine. 4 tablet 0   lovastatin (MEVACOR) 20 MG tablet TAKE 1 TABLET(20 MG) BY MOUTH AT BEDTIME 90 tablet 3   mometasone (NASONEX) 50 MCG/ACT nasal spray SHAKE LIQUID AND USE 2 SPRAYS IN EACH NOSTRIL DAILY 17 g 2   montelukast (SINGULAIR) 10 MG tablet TAKE 1 TABLET(10 MG) BY MOUTH AT BEDTIME 90 tablet 1   olmesartan-hydrochlorothiazide (BENICAR HCT) 40-25 MG tablet TAKE 1 TABLET BY MOUTH DAILY 90 tablet 1   omeprazole (PRILOSEC) 40 MG capsule TAKE 1 CAPSULE(40 MG) BY MOUTH DAILY 90 capsule 1   ONETOUCH VERIO test strip USE TO TEST BLOOD SUGAR EVERY DAY 100 strip 3   PARoxetine (PAXIL) 20 MG tablet TAKE 1 TABLET(20 MG) BY MOUTH DAILY 30 tablet 1   phenylephrine (SUDAFED PE) 10 MG TABS tablet Take 10 mg by mouth every 4 (four) hours as needed (WHEN FLYING, TO PREVENT ALTITUDE-RELATED EAR  PAIN).     PREVIDENT 0.2 % SOLN 5 mLs by Mouth Rinse route 2 (two) times daily.     repaglinide (PRANDIN) 0.5 MG tablet Take 1 tablet (0.5 mg total) by mouth 2 (two) times daily before a meal. 180 tablet 3   tirzepatide (MOUNJARO) 5 MG/0.5ML Pen Inject 5 mg into the skin once a week. 6 mL 3   No current facility-administered medications on file prior to visit.   Allergies  Allergen Reactions   Codeine Other (See Comments)    Unable to Urinate    Family History  Problem Relation Age of Onset   Hyperlipidemia Mother    Stroke Mother    Heart disease Mother    Hypertension Mother    Diabetes Mother    Colon polyps Mother        TA+   Hyperlipidemia Father    Heart disease Father    Stroke Father    Hypertension Father    Diabetes Father    Colon cancer Father        Dx'd in his 48's    Diabetes Brother    Hypertension Maternal Grandmother    Diabetes Mellitus II Maternal Grandmother    Colon cancer Paternal Uncle    Esophageal cancer Neg Hx    Rectal cancer Neg Hx    Stomach cancer Neg Hx    PE: BP 120/70   Pulse 81   Ht 6' (1.829 m)   Wt 292 lb (132.5 kg)   SpO2 99%   BMI 39.60 kg/m  Wt Readings from Last 10 Encounters:  12/10/22 292 lb (132.5 kg)  09/14/22 298 lb 9.6 oz (135.4 kg)  09/13/22  298 lb 9.6 oz (135.4 kg)  08/07/22 286 lb 12.8 oz (130.1 kg)  07/30/22 283 lb (128.4 kg)  07/09/22 296 lb (134.3 kg)  06/25/22 295 lb 9.6 oz (134.1 kg)  06/04/22 (!) 304 lb 4 oz (138 kg)  04/24/22 300 lb (136.1 kg)  04/04/22 (!) 307 lb 2 oz (139.3 kg)   Constitutional: obese, in NAD Eyes: no exophthalmos ENT:  no thyromegaly, no cervical lymphadenopathy Cardiovascular: RRR, No MRG Respiratory: CTA B Musculoskeletal: no deformities Skin: no rashes Neurological: no tremor with outstretched hands  ASSESSMENT: 1. DM2, non-insulin-dependent, uncontrolled, without long-term complications  2. HL  3.  History of hyperthyroidism  PLAN:  1. Patient with longstanding,  previously uncontrolled type 2 diabetes, previously on oral antidiabetic regimen with meglitinide and also weekly GLP-1/GIP receptor agonist, now only on Mounjaro after she ran out of repaglinide.  At last visit, sugars were all at goal and HbA1c was lower, at 6.1%, so we did not change her regimen.  She had problems obtaining Mounjaro since last visit but the PA was approved in 10/2022 until 10/2025. -At today's visit sugars are at goal despite the fact that she ran out of repaglinide approximately 1 month ago.  I do not feel we need to restart this now, but I did advise her to let me know if the sugars are higher during the holidays.  At that time, we can at least restart this with larger dinners.  For now, we will continue the same dose of Mounjaro. - I suggested to:  Patient Instructions  Please continue: - Mounjaro 5 mg weekly  Please return in 4-6 months with your sugar log.   - we checked her HbA1c: 5.8% (lower) - advised to check sugars at different times of the day - 1x a day, rotating check times - advised for yearly eye exams >> she is UTD - return to clinic in 4-6 months  2. HL -Reviewed latest lipid panel from 07/2022: Fractions at goal: Lab Results  Component Value Date   CHOL 153 08/07/2022   HDL 44.90 08/07/2022   LDLCALC 93 08/07/2022   TRIG 75.0 08/07/2022   CHOLHDL 3 08/07/2022  -She continues lovastatin 20 mg daily without side effects  3.  History of hyperthyroidism -Previous treatment-in 2006 -Now resolved -Status was normal at last visit -Will continue to keep an eye on her TFTs  Carlus Pavlov, MD PhD [facility] Endocr[facility]

## 2022-12-19 ENCOUNTER — Encounter: Payer: BC Managed Care – PPO | Admitting: Psychology

## 2022-12-19 NOTE — Progress Notes (Signed)
This encounter was created in error - please disregard.

## 2022-12-26 ENCOUNTER — Ambulatory Visit (INDEPENDENT_AMBULATORY_CARE_PROVIDER_SITE_OTHER): Payer: BC Managed Care – PPO | Admitting: Podiatry

## 2022-12-26 DIAGNOSIS — Z91199 Patient's noncompliance with other medical treatment and regimen due to unspecified reason: Secondary | ICD-10-CM

## 2022-12-26 NOTE — Progress Notes (Signed)
1. No-show for appointment     

## 2022-12-31 ENCOUNTER — Ambulatory Visit: Payer: BC Managed Care – PPO | Admitting: Family Medicine

## 2023-01-03 ENCOUNTER — Encounter: Payer: Self-pay | Admitting: Family Medicine

## 2023-01-03 DIAGNOSIS — F331 Major depressive disorder, recurrent, moderate: Secondary | ICD-10-CM

## 2023-01-03 DIAGNOSIS — J309 Allergic rhinitis, unspecified: Secondary | ICD-10-CM

## 2023-01-03 DIAGNOSIS — F419 Anxiety disorder, unspecified: Secondary | ICD-10-CM

## 2023-01-04 ENCOUNTER — Other Ambulatory Visit: Payer: Self-pay | Admitting: Family Medicine

## 2023-01-04 DIAGNOSIS — J4520 Mild intermittent asthma, uncomplicated: Secondary | ICD-10-CM

## 2023-01-04 MED ORDER — HYDROXYZINE PAMOATE 25 MG PO CAPS
ORAL_CAPSULE | ORAL | 2 refills | Status: AC
Start: 2023-01-04 — End: ?

## 2023-01-04 MED ORDER — AMLODIPINE BESYLATE 2.5 MG PO TABS: 2.5000 mg | ORAL_TABLET | Freq: Every day | ORAL | 2 refills | Status: DC

## 2023-01-04 MED ORDER — MONTELUKAST SODIUM 10 MG PO TABS
10.0000 mg | ORAL_TABLET | Freq: Every day | ORAL | 2 refills | Status: AC
Start: 2023-01-04 — End: ?

## 2023-01-04 MED ORDER — PAROXETINE HCL 20 MG PO TABS
20.0000 mg | ORAL_TABLET | Freq: Every day | ORAL | 2 refills | Status: DC
Start: 2023-01-04 — End: 2023-04-29

## 2023-01-21 ENCOUNTER — Other Ambulatory Visit: Payer: Self-pay | Admitting: Family Medicine

## 2023-01-21 DIAGNOSIS — Z1231 Encounter for screening mammogram for malignant neoplasm of breast: Secondary | ICD-10-CM

## 2023-01-29 ENCOUNTER — Encounter: Payer: BC Managed Care – PPO | Admitting: Family Medicine

## 2023-01-29 ENCOUNTER — Other Ambulatory Visit: Payer: Self-pay

## 2023-01-31 ENCOUNTER — Other Ambulatory Visit: Payer: Self-pay | Admitting: Family Medicine

## 2023-02-07 ENCOUNTER — Encounter: Payer: Self-pay | Admitting: Internal Medicine

## 2023-02-07 DIAGNOSIS — E1165 Type 2 diabetes mellitus with hyperglycemia: Secondary | ICD-10-CM

## 2023-02-10 MED ORDER — ONETOUCH DELICA PLUS LANCET33G MISC
5 refills | Status: AC
Start: 2023-02-10 — End: ?

## 2023-02-10 MED ORDER — ONETOUCH VERIO W/DEVICE KIT
PACK | 0 refills | Status: AC
Start: 2023-02-10 — End: ?

## 2023-02-10 MED ORDER — ONETOUCH VERIO VI STRP
ORAL_STRIP | 3 refills | Status: AC
Start: 2023-02-10 — End: ?

## 2023-02-19 ENCOUNTER — Ambulatory Visit: Payer: BC Managed Care – PPO

## 2023-02-27 ENCOUNTER — Other Ambulatory Visit: Payer: Self-pay | Admitting: Family Medicine

## 2023-02-27 DIAGNOSIS — Z1231 Encounter for screening mammogram for malignant neoplasm of breast: Secondary | ICD-10-CM

## 2023-03-04 ENCOUNTER — Encounter: Payer: Self-pay | Admitting: Family Medicine

## 2023-03-04 ENCOUNTER — Telehealth (INDEPENDENT_AMBULATORY_CARE_PROVIDER_SITE_OTHER): Payer: 59 | Admitting: Family Medicine

## 2023-03-04 VITALS — Ht 72.0 in

## 2023-03-04 DIAGNOSIS — J019 Acute sinusitis, unspecified: Secondary | ICD-10-CM | POA: Diagnosis not present

## 2023-03-04 DIAGNOSIS — J4520 Mild intermittent asthma, uncomplicated: Secondary | ICD-10-CM | POA: Diagnosis not present

## 2023-03-04 DIAGNOSIS — J029 Acute pharyngitis, unspecified: Secondary | ICD-10-CM | POA: Diagnosis not present

## 2023-03-04 DIAGNOSIS — J301 Allergic rhinitis due to pollen: Secondary | ICD-10-CM | POA: Diagnosis not present

## 2023-03-04 MED ORDER — AMOXICILLIN-POT CLAVULANATE 875-125 MG PO TABS
1.0000 | ORAL_TABLET | Freq: Two times a day (BID) | ORAL | 0 refills | Status: AC
Start: 2023-03-04 — End: 2023-03-11

## 2023-03-04 MED ORDER — FLUTICASONE-SALMETEROL 250-50 MCG/ACT IN AEPB
1.0000 | INHALATION_SPRAY | Freq: Two times a day (BID) | RESPIRATORY_TRACT | 2 refills | Status: DC
Start: 2023-03-04 — End: 2023-04-29

## 2023-03-04 NOTE — Assessment & Plan Note (Signed)
 This problem could be contributing to some of her symptoms. Recommend resuming Nasonex  1 to 2 puffs in each nostril at bedtime for 10 to 14 days then as needed. Nasal saline irrigations as needed throughout the day. In the past she has been referred to immunologist, contact information given, so she can call and arrange appointment.

## 2023-03-04 NOTE — Progress Notes (Signed)
 Virtual Visit via Video Note I connected with Monica [name] on 03/04/2023 by a video enabled telemedicine application and verified that I am speaking with the correct person using two identifiers. Location patient: home Location provider:work office Persons participating in the virtual visit: patient, scribe,provider  I discussed the limitations of evaluation and management by telemedicine and the availability of in person appointments. The patient expressed understanding and agreed to proceed.  Chief Complaint  Patient presents with   Sore Throat        Nasal Congestion   Generalized Body Aches    Negative covid test   HPI: Monica [name] is a 48 y.o. female patient with a PMHx significant for DM II, HTN, seasonal allergies, GERD, tobacco use disorder, and asthma, who is being seen on video today complaining of sore throat, tongue pain, rhinorrhea, congestion, and body aches.   Patient complains of sore throat, rhinorrhea, congestion, chills, facial pain, and body aches for about three weeks.   She also endorses some left ear pain for the last 3 days, as well as some tongue pain and yellowness of her tongue that has resolved.  + Post nasal drainage.  Also mentions she has had some SOB related to her nasal congestion, as well as wheezing which she believes is related to heartburn.  She tested negative for covid.  Pertinent negatives include fever, stridor, dysphagia, dysphagia, chest pain, or enlarged lymph nodes. No abdominal pain,changes in bowel habits,or skin rash.  No known sick contacts.   Asthma:  She says her asthma and allergies have not been well controlled lately.  She says she needs her albuterol  inh 2/daily for wheezing and cough. She also has a prescription for nasonex  nasal spray but has not used it.  She is no longer taking Advair, not sure why it was discontinued.  She does not recall side effects and it seemed to be helping. She has been referred to  immunologist, has not received appt information yet.   HTN. Not checking BP at home.  She is on amlodipine  2.5 mg daily and olmesartan -HCTZ 40-25 mg daily.  ROS: See pertinent positives and negatives per HPI.  Past Medical History:  Diagnosis Date   Allergy    Anemia    Asthma    Diabetes mellitus without complication (HCC) 04/2016   GERD (gastroesophageal reflux disease)    Hypertension    OSA (obstructive sleep apnea)    Sleep apnea    no cpap - told no OSA after told did so unsure if + or not     Past Surgical History:  Procedure Laterality Date   ABDOMINAL HYSTERECTOMY  02/19/2006   BICEPS TENDON REPAIR Bilateral    FOOT SURGERY     Left   KNEE ARTHROSCOPY Bilateral    ROTATOR CUFF REPAIR Bilateral     Family History  Problem Relation Age of Onset   Hyperlipidemia Mother    Stroke Mother    Heart disease Mother    Hypertension Mother    Diabetes Mother    Colon polyps Mother        TA+   Hyperlipidemia Father    Heart disease Father    Stroke Father    Hypertension Father    Diabetes Father    Colon cancer Father        Dx'd in his 37's    Diabetes Brother    Hypertension Maternal Grandmother    Diabetes Mellitus II Maternal Grandmother    Colon cancer  Paternal Uncle    Esophageal cancer Neg Hx    Rectal cancer Neg Hx    Stomach cancer Neg Hx     Social History   Socioeconomic History   Marital status: Married    Spouse name: Not on file   Number of children: 1   Years of education: Not on file   Highest education level: Master's degree (e.g., MA, MS, MEng, MEd, MSW, MBA)  Occupational History   Occupation: Solicitor  Tobacco Use   Smoking status: Every Day    Current packs/day: 0.00    Types: Cigarettes    Last attempt to quit: 11/25/2018    Years since quitting: 4.2   Smokeless tobacco: Never   Tobacco comments:    down to 3 cigarettes per day  Vaping Use   Vaping status: Never Used  Substance and Sexual Activity    Alcohol use: Yes    Comment: socially   Drug use: No   Sexual activity: Not on file  Other Topics Concern   Not on file  Social History Narrative   Not on file   Social Drivers of Health   Financial Resource Strain: Low Risk  (01/28/2023)   Overall Financial Resource Strain (CARDIA)    Difficulty of Paying Living Expenses: Not very hard  Food Insecurity: No Food Insecurity (01/28/2023)   Hunger Vital Sign    Worried About Running Out of Food in the Last Year: Never true    Ran Out of Food in the Last Year: Never true  Transportation Needs: No Transportation Needs (01/28/2023)   PRAPARE - Administrator, Civil Service (Medical): No    Lack of Transportation (Non-Medical): No  Physical Activity: Insufficiently Active (01/28/2023)   Exercise Vital Sign    Days of Exercise per Week: 3 days    Minutes of Exercise per Session: 30 min  Stress: Stress Concern Present (01/28/2023)   [employer] of Occupational Health - Occupational Stress Questionnaire    Feeling of Stress : Very much  Social Connections: Moderately Isolated (01/28/2023)   Social Connection and Isolation Panel [NHANES]    Frequency of Communication with Friends and Family: Never    Frequency of Social Gatherings with Friends and Family: Never    Attends Religious Services: Never    Database Administrator or Organizations: Yes    Attends Engineer, Structural: More than 4 times per year    Marital Status: Married  Catering Manager Violence: Not At Risk (03/22/2022)   Humiliation, Afraid, Rape, and Kick questionnaire    Fear of Current or Ex-Partner: No    Emotionally Abused: No    Physically Abused: No    Sexually Abused: No     Current Outpatient Medications:    acetaminophen  (TYLENOL ) 650 MG CR tablet, Take 1,300 mg by mouth 2 (two) times daily as needed for pain., Disp: , Rfl:    albuterol  (VENTOLIN  HFA) 108 (90 Base) MCG/ACT inhaler, INHALE 2 PUFFS INTO THE LUNGS EVERY 6 HOURS AS NEEDED FOR  WHEEZING OR SHORTNESS OF BREATH, Disp: 8.5 g, Rfl: 3   amLODipine  (NORVASC ) 2.5 MG tablet, Take 1 tablet (2.5 mg total) by mouth daily., Disp: 90 tablet, Rfl: 2   amoxicillin -clavulanate (AUGMENTIN ) 875-125 MG tablet, Take 1 tablet by mouth 2 (two) times daily for 7 days., Disp: 14 tablet, Rfl: 0   BAYER ASPIRIN EC LOW DOSE 81 MG tablet, Take 81 mg by mouth in the morning. Swallow whole., Disp: , Rfl:  Blood Glucose Monitoring Suppl (ONETOUCH VERIO) w/Device KIT, Use to check blood sugar once a day., Disp: 1 kit, Rfl: 0   fluticasone -salmeterol (ADVAIR DISKUS) 250-50 MCG/ACT AEPB, Inhale 1 puff into the lungs in the morning and at bedtime., Disp: 60 each, Rfl: 2   glucose blood (ONETOUCH VERIO) test strip, Use to check blood sugar once a day., Disp: 100 strip, Rfl: 3   hydrOXYzine  (VISTARIL ) 25 MG capsule, Take 1 to 2 capsules by mouth at bedtime as needed., Disp: 180 capsule, Rfl: 2   ibuprofen  (ADVIL ) 200 MG tablet, Take 400 mg by mouth every 6 (six) hours as needed for mild pain or headache., Disp: , Rfl:    Lancets (ONETOUCH DELICA PLUS LANCET33G) MISC, USE TO TEST BLOOD SUGAR EVERYDAY, Disp: 100 each, Rfl: 5   levocetirizine (XYZAL ) 5 MG tablet, TAKE 1 TABLET(5 MG) BY MOUTH EVERY EVENING, Disp: 90 tablet, Rfl: 2   lovastatin  (MEVACOR ) 20 MG tablet, TAKE 1 TABLET(20 MG) BY MOUTH AT BEDTIME, Disp: 90 tablet, Rfl: 0   mometasone  (NASONEX ) 50 MCG/ACT nasal spray, SHAKE LIQUID AND USE 2 SPRAYS IN EACH NOSTRIL DAILY, Disp: 17 g, Rfl: 2   montelukast  (SINGULAIR ) 10 MG tablet, Take 1 tablet (10 mg total) by mouth at bedtime., Disp: 90 tablet, Rfl: 2   olmesartan -hydrochlorothiazide  (BENICAR  HCT) 40-25 MG tablet, TAKE 1 TABLET BY MOUTH DAILY, Disp: 90 tablet, Rfl: 1   omeprazole  (PRILOSEC) 40 MG capsule, TAKE 1 CAPSULE(40 MG) BY MOUTH DAILY, Disp: 90 capsule, Rfl: 1   PARoxetine  (PAXIL ) 20 MG tablet, Take 1 tablet (20 mg total) by mouth daily., Disp: 90 tablet, Rfl: 2   tirzepatide  (MOUNJARO ) 5  MG/0.5ML Pen, Inject 5 mg into the skin once a week., Disp: 6 mL, Rfl: 3  EXAM:  VITALS per patient if applicable:Ht 6' (1.829 m)   BMI 39.60 kg/m   GENERAL: alert, oriented, appears well and in no acute distress  HEENT: atraumatic, conjunctiva clear, no obvious abnormalities on inspection of external nose and ears Nasal congestion. Tenderness when she applies pressure on frontal and maxillary sinuses.  NECK: normal movements of the head and neck  LUNGS: on inspection no signs of respiratory distress, breathing rate appears normal, no obvious gross SOB, gasping or wheezing  CV: no obvious cyanosis  MS: moves all visible extremities without noticeable abnormality  PSYCH/NEURO: pleasant and cooperative, no obvious depression or anxiety, speech and thought processing grossly intact  ASSESSMENT AND PLAN:  Discussed the following assessment and plan:  Acute non-recurrent sinusitis, unspecified location - Plan: amoxicillin -clavulanate (AUGMENTIN ) 875-125 MG tablet  Allergic rhinitis due to pollen, unspecified seasonality  Asthma, intermittent, uncomplicated - Plan: fluticasone -salmeterol (ADVAIR DISKUS) 250-50 MCG/ACT AEPB  Sore throat  Acute non-recurrent sinusitis, unspecified location She reports some pain when applying pressure to frontal and maxillary sinuses, we discussed differential diagnosis, allergy rhinitis and residual symptoms from viral respiratory illness could also cause symptoms. Because symptoms have been going on for about 3 weeks, she will start Augmentin  375-125 mg twice daily for 7 days. Monitor for new symptoms. Nasonex  nasal spray at night for 10-14 days. She has taken prednisone  in the past but it increased BS's bad.  -     Amoxicillin -Pot Clavulanate; Take 1 tablet by mouth 2 (two) times daily for 7 days.  Dispense: 14 tablet; Refill: 0  Allergic rhinitis due to pollen, unspecified seasonality Assessment & Plan: This problem could be contributing  to some of her symptoms. Recommend resuming Nasonex  1 to 2 puffs in each  nostril at bedtime for 10 to 14 days then as needed. Nasal saline irrigations as needed throughout the day. In the past she has been referred to immunologist, contact information given, so she can call and arrange appointment.   Asthma, intermittent, uncomplicated Assessment & Plan: Problem is not well-controlled, she reports using albuterol  at least twice daily. In the past she has been on Advair, she is not sure why medication was discontinued, she thinks it did help, so recommend resuming Advair 250-50 mcg twice daily, instructed to rinse mouth after use. Continue albuterol  inhaler 1 to 2 puff every 6 hours as needed. She will call immunologist office to establish care.  Orders: -     Fluticasone -Salmeterol; Inhale 1 puff into the lungs in the morning and at bedtime.  Dispense: 60 each; Refill: 2  Sore throat We discussed possible etiologies, GERD could be an aggravating factor as well as some of her chronic comorbilities. She does not believe it is strep, which she has ahd before, she thinks it is caused by post nasal drainage. No known strep exposure. Throat lozenges if needed. Today empiric treatment with Augmentin  started to treat possible sinus infection.  We discussed possible serious and likely etiologies, options for evaluation and workup, limitations of telemedicine visit vs in person visit, treatment, treatment risks and precautions. The patient was advised to call back or seek an in-person evaluation if the symptoms worsen or if the condition fails to improve as anticipated. I discussed the assessment and treatment plan with the patient. The patient was provided an opportunity to ask questions and all were answered. The patient agreed with the plan and demonstrated an understanding of the instructions.  Return if symptoms worsen or fail to improve, for keep next appointment.  I, Leonce PARAS Wierda,  acting as a scribe for Florestine Carmical, MD., have documented all relevant documentation on the behalf of Alben Jepsen, MD, as directed by  Grettell Ransdell, MD while in the presence of Anicia Leuthold, MD.   I, Prashant Glosser, MD, have reviewed all documentation for this visit. The documentation on 03/04/23 for the exam, diagnosis, procedures, and orders are all accurate and complete.  Charene Mccallister, MD

## 2023-03-04 NOTE — Assessment & Plan Note (Signed)
 Problem is not well-controlled, she reports using albuterol  at least twice daily. In the past she has been on Advair, she is not sure why medication was discontinued, she thinks it did help, so recommend resuming Advair 250-50 mcg twice daily, instructed to rinse mouth after use. Continue albuterol  inhaler 1 to 2 puff every 6 hours as needed. She will call immunologist office to establish care.

## 2023-03-07 ENCOUNTER — Encounter: Payer: Self-pay | Admitting: Family Medicine

## 2023-03-14 DIAGNOSIS — Z1231 Encounter for screening mammogram for malignant neoplasm of breast: Secondary | ICD-10-CM

## 2023-03-27 ENCOUNTER — Inpatient Hospital Stay: Admission: RE | Admit: 2023-03-27 | Payer: Self-pay | Source: Ambulatory Visit

## 2023-04-23 ENCOUNTER — Encounter: Payer: Self-pay | Admitting: Physician Assistant

## 2023-04-23 DIAGNOSIS — I2510 Atherosclerotic heart disease of native coronary artery without angina pectoris: Secondary | ICD-10-CM

## 2023-04-23 HISTORY — DX: Atherosclerotic heart disease of native coronary artery without angina pectoris: I25.10

## 2023-04-23 NOTE — Progress Notes (Deleted)
   Cardiology Office Note:    Date:  04/23/2023  ID:  Monica [name], DOB 08-Jun-1975, MRN [id number] PCP: [place], Betty G, MD  [facility] Providers Cardiologist:  Orbie Pyo, MD { Click to update primary MD,subspecialty MD or APP then REFRESH:1}    {Click to Open Review  :1}   Patient Profile:      Diabetes mellitus  Hypertension  Hyperlipidemia  Tobacco use Obesity  Chest pain  CCTA 04/20/21: pRCA mild soft plaque < 25, CAC score 0 TTE 04/20/21: EF 55-60, no RWMA, GLS -22.7, NL RVSF, RVSP 29.7, mild MR, RAP 8        Discussed the use of AI scribe software for clinical note transcription with the patient, who gave verbal consent to proceed.  History of Present Illness Monica [name] is a 48 y.o. female who returns for follow up of CAD, HTN. She was last seen in 04/2022.    ROS-See HPI***    Studies Reviewed:       *** Results    Risk Assessment/Calculations:   {Does this patient have ATRIAL FIBRILLATION?:[phone]} No BP recorded.  {Refresh Note OR Click here to enter BP  :1}***       Physical Exam:   VS:  There were no vitals taken for this visit.   Wt Readings from Last 3 Encounters:  12/10/22 292 lb (132.5 kg)  09/14/22 298 lb 9.6 oz (135.4 kg)  09/13/22 298 lb 9.6 oz (135.4 kg)    Physical Exam***     Assessment and Plan:   Assessment & Plan Coronary artery disease involving native coronary artery of native heart without angina pectoris  Hypertension, essential, benign  Hyperlipidemia associated with type 2 diabetes mellitus (HCC)  Mild mitral regurgitation  Assessment and Plan Assessment & Plan    {  Hypertension, essential, benign BP is well controlled. She had to reduce her Olmesartan/HCTZ due to low BP in the setting of recent admission for colitis. From a CV risk standpoint, she is doing fairly well. CCTA last year showed a CAC score of 0. Her echocardiogram showed normal EF. Recent A1c was 6.8. Recent LDL was optimal at 79 on  Lovastatin 20 mg. She remains on ASA 81 mg once daily. As she is a diabetic and continues to smoke and there was a mild soft plaque noted on her CCTA, it seems fairly reasonable to continue ASA. I have recommended that she quit smoking. F/u with Dr. Lynnette Caffey or me in 1 year.    Mild mitral regurgitation Mild on echocardiogram in 2023. Consider repeat echocardiogram in 2-3 years.     :1}    {Are you ordering a CV Procedure (e.g. stress test, cath, DCCV, TEE, etc)?   Press F2        :[id number]}  Dispo:  No follow-ups on file.  Signed, Tereso Newcomer, PA-C

## 2023-04-24 ENCOUNTER — Ambulatory Visit: Payer: 59 | Attending: Physician Assistant | Admitting: Physician Assistant

## 2023-04-24 DIAGNOSIS — E1169 Type 2 diabetes mellitus with other specified complication: Secondary | ICD-10-CM

## 2023-04-24 DIAGNOSIS — I340 Nonrheumatic mitral (valve) insufficiency: Secondary | ICD-10-CM

## 2023-04-24 DIAGNOSIS — I10 Essential (primary) hypertension: Secondary | ICD-10-CM

## 2023-04-24 DIAGNOSIS — I2510 Atherosclerotic heart disease of native coronary artery without angina pectoris: Secondary | ICD-10-CM

## 2023-04-29 ENCOUNTER — Other Ambulatory Visit: Payer: Self-pay | Admitting: Family Medicine

## 2023-04-29 ENCOUNTER — Encounter: Payer: Self-pay | Admitting: Family Medicine

## 2023-04-29 ENCOUNTER — Ambulatory Visit (INDEPENDENT_AMBULATORY_CARE_PROVIDER_SITE_OTHER): Admitting: Family Medicine

## 2023-04-29 VITALS — BP 124/80 | HR 96 | Resp 16 | Ht 72.0 in | Wt 320.1 lb

## 2023-04-29 DIAGNOSIS — E1169 Type 2 diabetes mellitus with other specified complication: Secondary | ICD-10-CM

## 2023-04-29 DIAGNOSIS — F331 Major depressive disorder, recurrent, moderate: Secondary | ICD-10-CM

## 2023-04-29 DIAGNOSIS — K219 Gastro-esophageal reflux disease without esophagitis: Secondary | ICD-10-CM

## 2023-04-29 DIAGNOSIS — G479 Sleep disorder, unspecified: Secondary | ICD-10-CM | POA: Diagnosis not present

## 2023-04-29 DIAGNOSIS — E785 Hyperlipidemia, unspecified: Secondary | ICD-10-CM | POA: Diagnosis not present

## 2023-04-29 DIAGNOSIS — Z23 Encounter for immunization: Secondary | ICD-10-CM | POA: Diagnosis not present

## 2023-04-29 DIAGNOSIS — J4520 Mild intermittent asthma, uncomplicated: Secondary | ICD-10-CM

## 2023-04-29 DIAGNOSIS — I10 Essential (primary) hypertension: Secondary | ICD-10-CM

## 2023-04-29 DIAGNOSIS — Z0000 Encounter for general adult medical examination without abnormal findings: Secondary | ICD-10-CM | POA: Insufficient documentation

## 2023-04-29 LAB — LIPID PANEL
Cholesterol: 165 mg/dL (ref 0–200)
HDL: 59.6 mg/dL (ref >39.00)
LDL Cholesterol: 89 mg/dL (ref 0–99)
NonHDL: 105.07
Total CHOL/HDL Ratio: 3
Triglycerides: 79 mg/dL (ref 0.0–149.0)
VLDL: 15.8 mg/dL (ref 0.0–40.0)

## 2023-04-29 LAB — COMPREHENSIVE METABOLIC PANEL
ALT: 10 U/L (ref 0–35)
AST: 11 U/L (ref 0–37)
Albumin: 3.6 g/dL (ref 3.5–5.2)
Alkaline Phosphatase: 60 U/L (ref 39–117)
BUN: 9 mg/dL (ref 6–23)
CO2: 27 meq/L (ref 19–32)
Calcium: 9.4 mg/dL (ref 8.4–10.5)
Chloride: 106 meq/L (ref 96–112)
Creatinine, Ser: 0.91 mg/dL (ref 0.40–1.20)
GFR: 73.79 mL/min (ref >60.00)
Glucose, Bld: 115 mg/dL — ABNORMAL HIGH (ref 70–99)
Potassium: 4.3 meq/L (ref 3.5–5.1)
Sodium: 141 meq/L (ref 135–145)
Total Bilirubin: 0.3 mg/dL (ref 0.2–1.2)
Total Protein: 6.9 g/dL (ref 6.0–8.3)

## 2023-04-29 MED ORDER — FLUTICASONE-SALMETEROL 250-50 MCG/ACT IN AEPB
1.0000 | INHALATION_SPRAY | Freq: Two times a day (BID) | RESPIRATORY_TRACT | 2 refills | Status: DC
Start: 2023-04-29 — End: 2023-07-29

## 2023-04-29 MED ORDER — PAROXETINE HCL 20 MG PO TABS
20.0000 mg | ORAL_TABLET | Freq: Every day | ORAL | Status: AC
Start: 2023-04-29 — End: ?

## 2023-04-29 NOTE — Patient Instructions (Addendum)
 A few things to remember from today's visit:  Routine general medical examination at a health care facility  Hypertension, essential, benign - Plan: Comprehensive metabolic panel  Sleep disorder, unspecified - Plan: Ambulatory referral to Pulmonology  Hyperlipidemia associated with type 2 diabetes mellitus (HCC) - Plan: Comprehensive metabolic panel, Lipid panel  If you need refills for medications you take chronically, please call your pharmacy. Do not use My Chart to request refills or for acute issues that need immediate attention. If you send a my chart message, it may take a few days to be addressed, specially if I am not in the office.  Please be sure medication list is accurate. If a new problem present, please set up appointment sooner than planned today.  Health Maintenance, Female Adopting a healthy lifestyle and getting preventive care are important in promoting health and wellness. Ask your health care provider about: The right schedule for you to have regular tests and exams. Things you can do on your own to prevent diseases and keep yourself healthy. What should I know about diet, weight, and exercise? Eat a healthy diet  Eat a diet that includes plenty of vegetables, fruits, low-fat dairy products, and lean protein. Do not eat a lot of foods that are high in solid fats, added sugars, or sodium. Maintain a healthy weight Body mass index (BMI) is used to identify weight problems. It estimates body fat based on height and weight. Your health care provider can help determine your BMI and help you achieve or maintain a healthy weight. Get regular exercise Get regular exercise. This is one of the most important things you can do for your health. Most adults should: Exercise for at least 150 minutes each week. The exercise should increase your heart rate and make you sweat (moderate-intensity exercise). Do strengthening exercises at least twice a week. This is in addition to  the moderate-intensity exercise. Spend less time sitting. Even light physical activity can be beneficial. Watch cholesterol and blood lipids Have your blood tested for lipids and cholesterol at 50 years of age, then have this test every 5 years. Have your cholesterol levels checked more often if: Your lipid or cholesterol levels are high. You are older than 50 years of age. You are at high risk for heart disease. What should I know about cancer screening? Depending on your health history and family history, you may need to have cancer screening at various ages. This may include screening for: Breast cancer. Cervical cancer. Colorectal cancer. Skin cancer. Lung cancer. What should I know about heart disease, diabetes, and high blood pressure? Blood pressure and heart disease High blood pressure causes heart disease and increases the risk of stroke. This is more likely to develop in people who have high blood pressure readings or are overweight. Have your blood pressure checked: Every 3-5 years if you are 51-34 years of age. Every year if you are 58 years old or older. Diabetes Have regular diabetes screenings. This checks your fasting blood sugar level. Have the screening done: Once every three years after age 29 if you are at a normal weight and have a low risk for diabetes. More often and at a younger age if you are overweight or have a high risk for diabetes. What should I know about preventing infection? Hepatitis B If you have a higher risk for hepatitis B, you should be screened for this virus. Talk with your health care provider to find out if you are at risk for hepatitis  B infection. Hepatitis C Testing is recommended for: Everyone born from [birth year] through 1967. Anyone with known risk factors for hepatitis C. Sexually transmitted infections (STIs) Get screened for STIs, including gonorrhea and chlamydia, if: You are sexually active and are younger than 50 years of age. You  are older than 50 years of age and your health care provider tells you that you are at risk for this type of infection. Your sexual activity has changed since you were last screened, and you are at increased risk for chlamydia or gonorrhea. Ask your health care provider if you are at risk. Ask your health care provider about whether you are at high risk for HIV. Your health care provider may recommend a prescription medicine to help prevent HIV infection. If you choose to take medicine to prevent HIV, you should first get tested for HIV. You should then be tested every 3 months for as long as you are taking the medicine. Pregnancy If you are about to stop having your period (premenopausal) and you may become pregnant, seek counseling before you get pregnant. Take 400 to 800 micrograms (mcg) of folic acid every day if you become pregnant. Ask for birth control (contraception) if you want to prevent pregnancy. Osteoporosis and menopause Osteoporosis is a disease in which the bones lose minerals and strength with aging. This can result in bone fractures. If you are 61 years old or older, or if you are at risk for osteoporosis and fractures, ask your health care provider if you should: Be screened for bone loss. Take a calcium or vitamin D supplement to lower your risk of fractures. Be given hormone replacement therapy (HRT) to treat symptoms of menopause. Follow these instructions at home: Alcohol use Do not drink alcohol if: Your health care provider tells you not to drink. You are pregnant, may be pregnant, or are planning to become pregnant. If you drink alcohol: Limit how much you have to: 0-1 drink a day. Know how much alcohol is in your drink. In the [place] one drink equals one 12 oz bottle of beer (355 mL), one 5 oz glass of wine (148 mL), or one 1 oz glass of hard liquor (44 mL). Lifestyle Do not use any products that contain nicotine or tobacco. These products include cigarettes, chewing  tobacco, and vaping devices, such as e-cigarettes. If you need help quitting, ask your health care provider. Do not use street drugs. Do not share needles. Ask your health care provider for help if you need support or information about quitting drugs. General instructions Schedule regular health, dental, and eye exams. Stay current with your vaccines. Tell your health care provider if: You often feel depressed. You have ever been abused or do not feel safe at home. Summary Adopting a healthy lifestyle and getting preventive care are important in promoting health and wellness. Follow your health care provider's instructions about healthy diet, exercising, and getting tested or screened for diseases. Follow your health care provider's instructions on monitoring your cholesterol and blood pressure. This information is not intended to replace advice given to you by your health care provider. Make sure you discuss any questions you have with your health care provider. Document Revised: 06/27/2020 Document Reviewed: 06/27/2020 Elsevier Patient Education  2024 [employer].

## 2023-04-29 NOTE — Assessment & Plan Note (Addendum)
 We discussed the importance of regular physical activity and healthy diet for prevention of chronic illness and/or complications. Preventive guidelines reviewed. Vaccination updated today, Prevnar 20 and shingrix given today. S/P hysterectomy. Planning in scheduling mammogram. Next CPE in a year.

## 2023-04-29 NOTE — Assessment & Plan Note (Signed)
 Otherwise adequately controlled. Continue Olmesartan-HCTZ 40-25 mg daily and Amlodipine 2.5 mg daily, as well as low salt diet. Monitor BP at home. Eye exam is current.

## 2023-04-29 NOTE — Assessment & Plan Note (Signed)
 Problem is not well controlled. Sent rx for Advair 250-50 mcg to use twice daily. Continue albuterol inhaler 1 to 2 puff every 6 hours as needed. Pulmonology referral was placed today. Follow-up in 12 months, before if needed.

## 2023-04-29 NOTE — Progress Notes (Signed)
 HPI: Monica [name] is a 48 y.o. female with a PMHx significant for DM II, HTN, seasonal allergies, hypothyroidism, asthma, HLD, GERD, anxiety, and insomnia, among some, who is here today for her routine physical and c/o sleep problems.   Exercise: Patient states she is no longer exercising.  Diet: She says she is eating out more than cooking at home.  Sleep: 4-5 hours per night. She is having some sleep problems.  Alcohol Use: none Smoking: She is still smoking some days but is trying to quit.  Vision: UTD on routine vision care.  Dental: UTD on routine dental care.   Immunization History  Administered Date(s) Administered   Influenza,inj,Quad PF,6+ Mos 12/12/2016, 11/25/2017, 12/26/2018, 11/24/2019, 10/26/2020, 01/05/2022   Moderna SARS-COV2 Booster Vaccination 12/23/2019, 09/13/2020   Moderna Sars-Covid-2 Vaccination 04/30/2019, 06/02/2019   PNEUMOCOCCAL CONJUGATE-20 04/29/2023   PPD Test 03/11/2016, 10/15/2016   Pneumococcal Polysaccharide-23 08/13/2016   Tdap 04/24/2016   Zoster Recombinant(Shingrix) 04/29/2023   Health Maintenance  Topic Date Due   OPHTHALMOLOGY EXAM  06/25/2022   Diabetic kidney evaluation - eGFR measurement  04/05/2023   COVID-19 Vaccine (5 - 2024-25 season) 05/15/2023 (Originally 10/21/2022)   INFLUENZA VACCINE  05/20/2023 (Originally 09/20/2022)   Cervical Cancer Screening (HPV/Pap Cotest)  04/28/2024 (Originally 04/03/2003)   HEMOGLOBIN A1C  06/10/2023   FOOT EXAM  06/15/2023   Zoster Vaccines- Shingrix (2 of 2) 06/24/2023   Diabetic kidney evaluation - Urine ACR  12/10/2023   MAMMOGRAM  02/17/2024   Colonoscopy  05/31/2024   DTaP/Tdap/Td (2 - Td or Tdap) 04/25/2026   Pneumococcal Vaccine 5-3 Years old  Completed   Hepatitis C Screening  Completed   HIV Screening  Completed   HPV VACCINES  Aged Out   Chronic medical problems:   Anxiety and insomnia: She sees her psychiatrist Knox Royalty, PMHNP) monthly and a therapist weekly.   DM II: She  sees endocrinology every 3-4 months. Lab Results  Component Value Date   HGBA1C 5.7 (A) 12/10/2022   Also following with cardiology annually, sports medicine, and orthopedics.   GERD on Omeprazole 40 mg daily, she has not had heartburn.  Concerns today:   Sleeping problem:  Patient complains of a noise she is making while sleeping noted by her wife for a couple of weeks.  She is not woken up when making the noise.  Screened for OSA more than 10 years ago.  She admits she does not feel rested in the mornings.  Also endorses recent insomnia, for which she has been prescribed Seroquel 200 mg by psychiatry. She says it is not helping very much.   Additionally, she is having some wheezing during the day.   Skin lesion:  Patient also complains of a spot on her right lower leg noted a few days ago. She has had similar lesions in the past and eventually resolved. Denies pain or pruritus.   Asthma: She is using her Albuterol inh daily. Has not received Advair 250-50 mcg, Rx was sent in 02/2023. States that she was never contacted by immunology.   Hyperlipidemia on lovastatin 20 mg daily. Lab Results  Component Value Date   CHOL 153 08/07/2022   HDL 44.90 08/07/2022   LDLCALC 93 08/07/2022   TRIG 75.0 08/07/2022   CHOLHDL 3 08/07/2022   Hypertension on amlodipine 2.5 mg daily and olmesartan-HCTZ 40-25 mg daily.  Lab Results  Component Value Date   NA 135 04/04/2022   CL 97 04/04/2022   K 3.7 04/04/2022  CO2 29 04/04/2022   BUN 17 04/04/2022   CREATININE 1.08 04/04/2022   GFR 60.53 04/04/2022   CALCIUM 10.0 04/04/2022   ALBUMIN 3.0 (L) 03/23/2022   GLUCOSE 146 (H) 04/04/2022   Lab Results  Component Value Date   ALT 13 03/23/2022   AST 21 03/23/2022   ALKPHOS 49 03/23/2022   BILITOT 0.5 03/23/2022   Review of Systems  Constitutional:  Positive for fatigue. Negative for activity change, appetite change and fever.  HENT:  Negative for mouth sores, sore throat and  trouble swallowing.   Eyes:  Negative for redness and visual disturbance.  Respiratory:  Positive for wheezing. Negative for cough and shortness of breath.   Cardiovascular:  Negative for chest pain and leg swelling.  Gastrointestinal:  Negative for abdominal pain, nausea and vomiting.       No changes in bowel habits.  Endocrine: Negative for cold intolerance, heat intolerance, polydipsia, polyphagia and polyuria.  Genitourinary:  Negative for decreased urine volume, dysuria, hematuria and vaginal discharge.  Musculoskeletal:  Negative for gait problem and myalgias.  Skin:  Negative for color change and rash.  Allergic/Immunologic: Positive for environmental allergies.  Neurological:  Negative for syncope, weakness and headaches.  Hematological:  Negative for adenopathy. Does not bruise/bleed easily.  Psychiatric/Behavioral:  Positive for sleep disturbance. Negative for confusion and hallucinations.   All other systems reviewed and are negative.  Current Outpatient Medications on File Prior to Visit  Medication Sig Dispense Refill   acetaminophen (TYLENOL) 650 MG CR tablet Take 1,300 mg by mouth 2 (two) times daily as needed for pain.     albuterol (VENTOLIN HFA) 108 (90 Base) MCG/ACT inhaler INHALE 2 PUFFS INTO THE LUNGS EVERY 6 HOURS AS NEEDED FOR WHEEZING OR SHORTNESS OF BREATH 8.5 g 3   amLODipine (NORVASC) 2.5 MG tablet Take 1 tablet (2.5 mg total) by mouth daily. 90 tablet 2   BAYER ASPIRIN EC LOW DOSE 81 MG tablet Take 81 mg by mouth in the morning. Swallow whole.     Blood Glucose Monitoring Suppl (ONETOUCH VERIO) w/Device KIT Use to check blood sugar once a day. 1 kit 0   glucose blood (ONETOUCH VERIO) test strip Use to check blood sugar once a day. 100 strip 3   hydrOXYzine (VISTARIL) 25 MG capsule Take 1 to 2 capsules by mouth at bedtime as needed. 180 capsule 2   ibuprofen (ADVIL) 200 MG tablet Take 400 mg by mouth every 6 (six) hours as needed for mild pain or headache.      Lancets (ONETOUCH DELICA PLUS LANCET33G) MISC USE TO TEST BLOOD SUGAR EVERYDAY 100 each 5   levocetirizine (XYZAL) 5 MG tablet TAKE 1 TABLET(5 MG) BY MOUTH EVERY EVENING 90 tablet 2   lovastatin (MEVACOR) 20 MG tablet TAKE 1 TABLET(20 MG) BY MOUTH AT BEDTIME 90 tablet 0   mometasone (NASONEX) 50 MCG/ACT nasal spray SHAKE LIQUID AND USE 2 SPRAYS IN EACH NOSTRIL DAILY 17 g 2   montelukast (SINGULAIR) 10 MG tablet Take 1 tablet (10 mg total) by mouth at bedtime. 90 tablet 2   olmesartan-hydrochlorothiazide (BENICAR HCT) 40-25 MG tablet TAKE 1 TABLET BY MOUTH DAILY 90 tablet 1   omeprazole (PRILOSEC) 40 MG capsule TAKE 1 CAPSULE(40 MG) BY MOUTH DAILY 90 capsule 1   PARoxetine (PAXIL) 20 MG tablet Take 1 tablet (20 mg total) by mouth daily. 90 tablet 2   tirzepatide (MOUNJARO) 5 MG/0.5ML Pen Inject 5 mg into the skin once a week. 6 mL 3  No current facility-administered medications on file prior to visit.    Past Medical History:  Diagnosis Date   Allergy    Anemia    Anxiety 1992   Arthritis    Asthma    CAD (coronary artery disease) 04/23/2023   CCTA 04/20/21: pRCA mild soft plaque < 25, CAC score 0 TTE 04/20/21: EF 55-60, no RWMA, GLS -22.7, NL RVSF, RVSP 29.7, mild MR, RAP 8     Depression 2024   Diabetes mellitus without complication (HCC) 04/2016   GERD (gastroesophageal reflux disease)    Hypertension    OSA (obstructive sleep apnea)    Sleep apnea    no cpap - told no OSA after told did so unsure if + or not    Substance abuse (HCC) 6.1.2024   Alcohol    Past Surgical History:  Procedure Laterality Date   ABDOMINAL HYSTERECTOMY  02/19/2006   BICEPS TENDON REPAIR Bilateral    FOOT SURGERY     Left   KNEE ARTHROSCOPY Bilateral    ROTATOR CUFF REPAIR Bilateral     Allergies  Allergen Reactions   Codeine Other (See Comments)    Unable to Urinate     Family History  Problem Relation Age of Onset   Hyperlipidemia Mother    Stroke Mother    Heart disease Mother     Hypertension Mother    Diabetes Mother    Colon polyps Mother        TA+   Arthritis Mother    Hyperlipidemia Father    Heart disease Father    Stroke Father    Hypertension Father    Diabetes Father    Colon cancer Father        Dx'd in his 44's    Cancer Father    Early death Father    Diabetes Brother    Anxiety disorder Brother    Depression Brother    Drug abuse Brother    Learning disabilities Brother    Vision loss Brother    Hypertension Maternal Grandmother    Diabetes Mellitus II Maternal Grandmother    Colon cancer Paternal Uncle    Alcohol abuse Maternal Uncle    Alcohol abuse Maternal Uncle    Esophageal cancer Neg Hx    Rectal cancer Neg Hx    Stomach cancer Neg Hx     Social History   Socioeconomic History   Marital status: Married    Spouse name: Not on file   Number of children: 1   Years of education: Not on file   Highest education level: Master's degree (e.g., MA, MS, MEng, MEd, MSW, MBA)  Occupational History   Occupation: Solicitor  Tobacco Use   Smoking status: Some Days    Current packs/day: 0.00    Average packs/day: 0.3 packs/day for 15.0 years (3.8 ttl pk-yrs)    Types: Cigarettes    Last attempt to quit: 11/25/2018    Years since quitting: 4.4   Smokeless tobacco: Never   Tobacco comments:    down to 3 cigarettes per day  Vaping Use   Vaping status: Never Used  Substance and Sexual Activity   Alcohol use: Not Currently    Comment: socially   Drug use: No   Sexual activity: Yes    Birth control/protection: Abstinence, Surgical  Other Topics Concern   Not on file  Social History Narrative   Not on file   Social Drivers of Health   Financial Resource Strain: Low  Risk  (01/28/2023)   Overall Financial Resource Strain (CARDIA)    Difficulty of Paying Living Expenses: Not very hard  Food Insecurity: No Food Insecurity (01/28/2023)   Hunger Vital Sign    Worried About Running Out of Food in the Last Year: Never  true    Ran Out of Food in the Last Year: Never true  Transportation Needs: No Transportation Needs (01/28/2023)   PRAPARE - Administrator, Civil Service (Medical): No    Lack of Transportation (Non-Medical): No  Physical Activity: Insufficiently Active (01/28/2023)   Exercise Vital Sign    Days of Exercise per Week: 3 days    Minutes of Exercise per Session: 30 min  Stress: Stress Concern Present (01/28/2023)   [employer] of Occupational Health - Occupational Stress Questionnaire    Feeling of Stress : Very much  Social Connections: Moderately Isolated (01/28/2023)   Social Connection and Isolation Panel [NHANES]    Frequency of Communication with Friends and Family: Never    Frequency of Social Gatherings with Friends and Family: Never    Attends Religious Services: Never    Database administrator or Organizations: Yes    Attends Engineer, structural: More than 4 times per year    Marital Status: Married    Vitals:   04/29/23 0729  BP: 124/80  Pulse: 96  Resp: 16  SpO2: 96%   Body mass index is 43.42 kg/m.  Wt Readings from Last 3 Encounters:  04/29/23 (!) 320 lb 2 oz (145.2 kg)  12/10/22 292 lb (132.5 kg)  09/14/22 298 lb 9.6 oz (135.4 kg)    Physical Exam Vitals and nursing note reviewed.  Constitutional:      General: She is not in acute distress.    Appearance: She is well-developed.  HENT:     Head: Normocephalic and atraumatic.     Right Ear: Tympanic membrane, ear canal and external ear normal.     Left Ear: Tympanic membrane, ear canal and external ear normal.     Mouth/Throat:     Mouth: Mucous membranes are moist.     Pharynx: Oropharynx is clear. Uvula midline.  Eyes:     Extraocular Movements: Extraocular movements intact.     Conjunctiva/sclera: Conjunctivae normal.     Pupils: Pupils are equal, round, and reactive to light.  Neck:     Thyroid: No thyroid mass or thyromegaly.     Trachea: No tracheal deviation.   Cardiovascular:     Rate and Rhythm: Normal rate and regular rhythm.     Pulses:          Dorsalis pedis pulses are 2+ on the right side and 2+ on the left side.     Heart sounds: No murmur heard. Pulmonary:     Effort: Pulmonary effort is normal. No respiratory distress.     Breath sounds: Normal breath sounds.  Abdominal:     Palpations: Abdomen is soft. There is no hepatomegaly or mass.     Tenderness: There is no abdominal tenderness.  Genitourinary:    Comments: No concerns. Musculoskeletal:     Right lower leg: No edema.     Left lower leg: No edema.     Comments: No major deformity or signs of synovitis appreciated.  Lymphadenopathy:     Cervical: No cervical adenopathy.     Upper Body:     Right upper body: No supraclavicular adenopathy.     Left upper body: No supraclavicular adenopathy.  Skin:    General: Skin is warm.     Findings: Lesion present. No erythema or rash.     Comments: About 1 cm rounded lesion under mildly hyperpigmented lesion right pretibial.Not tender.  Neurological:     General: No focal deficit present.     Mental Status: She is alert and oriented to person, place, and time.     Cranial Nerves: No cranial nerve deficit.     Sensory: No sensory deficit.     Motor: Tremor present. No weakness.     Coordination: Coordination normal.     Gait: Gait normal.     Deep Tendon Reflexes:     Reflex Scores:      Bicep reflexes are 2+ on the right side and 2+ on the left side.      Patellar reflexes are 2+ on the right side and 2+ on the left side.    Comments: Minimal hand tremor (not at rest) and feet movement (? Involuntary) noted on examination.  Psychiatric:        Mood and Affect: Mood and affect normal.    ASSESSMENT AND PLAN: Ms. NAMYA VOGES was here today for her annual physical examination and a sleep problem.  Orders Placed This Encounter  Procedures   Pneumococcal conjugate vaccine 20-valent (Prevnar 20)   Zoster Recombinant  (Shingrix )   Comprehensive metabolic panel   Lipid panel   Ambulatory referral to Pulmonology   Lab Results  Component Value Date   CHOL 165 04/29/2023   HDL 59.60 04/29/2023   LDLCALC 89 04/29/2023   TRIG 79.0 04/29/2023   CHOLHDL 3 04/29/2023   Lab Results  Component Value Date   NA 141 04/29/2023   CL 106 04/29/2023   K 4.3 04/29/2023   CO2 27 04/29/2023   BUN 9 04/29/2023   CREATININE 0.91 04/29/2023   GFR 73.79 04/29/2023   CALCIUM 9.4 04/29/2023   ALBUMIN 3.6 04/29/2023   GLUCOSE 115 (H) 04/29/2023   Lab Results  Component Value Date   ALT 10 04/29/2023   AST 11 04/29/2023   ALKPHOS 60 04/29/2023   BILITOT 0.3 04/29/2023   Routine general medical examination at a health care facility Assessment & Plan: We discussed the importance of regular physical activity and healthy diet for prevention of chronic illness and/or complications. Preventive guidelines reviewed. Vaccination updated today, Prevnar 20 and shingrix given today. S/P hysterectomy. Planning in scheduling mammogram. Next CPE in a year.  Hyperlipidemia associated with type 2 diabetes mellitus (HCC) Assessment & Plan: LDL 93 in 07/2022. Continue lovastatin 20 mg daily and low-fat diet. Further recommendation will be given according to lipid panel result.  Orders: -     Comprehensive metabolic panel; Future -     Lipid panel; Future  Hypertension, essential, benign Assessment & Plan: Otherwise adequately controlled. Continue Olmesartan-HCTZ 40-25 mg daily and Amlodipine 2.5 mg daily, as well as low salt diet. Monitor BP at home. Eye exam is current.  Orders: -     Comprehensive metabolic panel; Future  Sleep disorder, unspecified ? OSA. Has had sleep study years ago. Pulmonology referral placed.  -     Ambulatory referral to Pulmonology  Need for shingles vaccine -     Varicella-zoster vaccine IM  Asthma, intermittent, uncomplicated Assessment & Plan: Problem is not well  controlled. Sent rx for Advair 250-50 mcg to use twice daily. Continue albuterol inhaler 1 to 2 puff every 6 hours as needed. Pulmonology referral was placed today. Follow-up  in 12 months, before if needed.  Orders: -     Fluticasone-Salmeterol; Inhale 1 puff into the lungs in the morning and at bedtime.  Dispense: 60 each; Refill: 2  Gastroesophageal reflux disease, unspecified whether esophagitis present Assessment & Plan: Reports problem is well-controlled. Currently on omeprazole 40 mg daily. Continue GERD precautions.   Moderate episode of recurrent major depressive disorder [facility]) Assessment & Plan: Following with psychiatrist.  Orders: -     PARoxetine HCl; Take 1 tablet (20 mg total) by mouth daily.  Need for pneumococcal vaccination -     Pneumococcal conjugate vaccine 20-valent   Return in 1 year (on 04/28/2024) for CPE.  Jiyah Torpey G. [place], MD  [facility]. [place] office.

## 2023-04-29 NOTE — Assessment & Plan Note (Signed)
Following with psychiatrist. 

## 2023-04-29 NOTE — Assessment & Plan Note (Signed)
 Reports problem is well-controlled. Currently on omeprazole 40 mg daily. Continue GERD precautions.

## 2023-04-29 NOTE — Assessment & Plan Note (Signed)
 LDL 93 in 07/2022. Continue lovastatin 20 mg daily and low-fat diet. Further recommendation will be given according to lipid panel result.

## 2023-05-01 ENCOUNTER — Ambulatory Visit

## 2023-05-01 ENCOUNTER — Encounter

## 2023-05-08 ENCOUNTER — Ambulatory Visit: Payer: Self-pay | Admitting: Podiatry

## 2023-05-09 NOTE — Progress Notes (Signed)
 Cardiology Office Note:    Date:  05/10/2023  ID:  INFANT Monica [name], DOB 1975/12/23, MRN [id number] PCP: [place], Betty G, MD  [facility] Providers Cardiologist:  Orbie Pyo, MD       Patient Profile:      Diabetes mellitus  Hypertension  Hyperlipidemia  Tobacco use Obesity  Coronary artery disease   CCTA 04/20/21: pRCA mild soft plaque < 25, CAC score 0 TTE 04/20/21: EF 55-60, no RWMA, GLS -22.7, NL RVSF, RVSP 29.7, mild MR, RAP 8          Discussed the use of AI scribe software for clinical note transcription with the patient, who gave verbal consent to proceed.  History of Present Illness Monica [name] is a 48 y.o. female who returns for follow up of HTN, HL. She was last seen in 04/2022. She had a CCTA in 04/2021 to evaluate chest pain and it showed no CAD, CAC score of 0. There was a mild soft plaque <25% in the proximal RCA. TTE showed normal EF and mild MR.   She experiences palpitations and a sensation of her heart racing, particularly noticeable at night. These episodes typically resolve after 20 to 30 minutes. Her wearable device has indicated a high resting heart rate over the past few days. No chest discomfort, shortness of breath, syncope, or leg swelling. She is currently taking Seroquel at a dose of 200 mg and Lexapro at 20 mg. The doses were recently increased this week.  Her palpitations occur after taking her medications at night.  Her primary care provider is referring her to pulmonology for sleep study to rule out sleep apnea.  ROS-See HPI    Studies Reviewed:   EKG Interpretation Date/Time:  Friday May 10 2023 11:23:09 EDT Ventricular Rate:  90 PR Interval:  138 QRS Duration:  76 QT Interval:  350 QTC Calculation: 428 R Axis:   15  Text Interpretation: Normal sinus rhythm Nonspecific T wave abnormality  Similar to prior tracings Confirmed by Tereso Newcomer [phone]) on 05/10/2023 11:32:43 AM    Results 04/29/23: K 4.3, SCr. 0.91, ALT 10, TC 165,  HDL 59.6, LDL 89, Tg 79   Risk Assessment/Calculations:           Physical Exam:   VS:  BP 108/70   Pulse 90   Ht 6' (1.829 m)   Wt (!) 303 lb 11.2 oz (137.8 kg)   SpO2 98%   BMI 41.19 kg/m    Wt Readings from Last 3 Encounters:  05/10/23 (!) 303 lb 11.2 oz (137.8 kg)  05/10/23 (!) 307 lb 12.8 oz (139.6 kg)  04/29/23 (!) 320 lb 2 oz (145.2 kg)    Constitutional:      Appearance: Healthy appearance. Not in distress.  Pulmonary:     Breath sounds: Normal breath sounds. No wheezing. No rales.  Cardiovascular:     Normal rate. Regular rhythm.     Murmurs: There is no murmur.  Edema:    Peripheral edema absent.        Assessment and Plan:   Assessment & Plan Coronary artery disease involving native coronary artery of native heart without angina pectoris CCTA in 04/2021 with mild soft plaque in the RCA. However, the CAC score was 0.  No chest symptoms to suggest angina. - Continue lovastatin 20 mg daily. - Consider aspirin 81 mg daily. Hypertension, essential, benign Blood pressure controlled. -Continue amlodipine 2.5 mg daily, olmesartan/HCTZ 40/25 mg daily Hyperlipidemia associated with type  2 diabetes mellitus (HCC) LDL cholesterol level is 89. I think her target is less than 100. She is at goal.  - Continue lovastatin 20 mg daily. Mild mitral regurgitation Mild on TTE in 04/2021. No symptoms of shortness of breath to suggest worsening. - Plan for a repeat echocardiogram in two years to reassess the mitral valve. - Schedule follow-up visit in two years unless symptoms such as dyspnea or worsening palpitations occur. Palpitations Her symptoms occur at night after taking Seroquel and Lexapro. The dose was just increased this week. EKG shows no arrhythmia, and QT interval is normal. Palpitations may be a side effect of recent dose adjustments. Since her dose was just increased this week. I think it is reasonable to monitor for a couple of weeks to see if her symptoms resolve.  If not, we can get a 2 week Zio monitor at that point. I also asked her to check with her psychiatrist to see if this is a common side effect after increasing the dose.  - If symptoms continue or her psychiatrist notes that these are unusual side effects, she will contact me to arrange a 2 weeks Zio monitor.         Dispo:  Return in about 2 years (around 05/09/2025) for Routine Follow Up, w/ Tereso Newcomer, PA-C.  Signed, Tereso Newcomer, PA-C

## 2023-05-10 ENCOUNTER — Ambulatory Visit: Attending: Physician Assistant | Admitting: Physician Assistant

## 2023-05-10 ENCOUNTER — Encounter: Payer: Self-pay | Admitting: Internal Medicine

## 2023-05-10 ENCOUNTER — Ambulatory Visit: Payer: BC Managed Care – PPO | Admitting: Internal Medicine

## 2023-05-10 ENCOUNTER — Encounter: Payer: Self-pay | Admitting: Physician Assistant

## 2023-05-10 VITALS — BP 124/70 | HR 103 | Ht 72.0 in | Wt 307.8 lb

## 2023-05-10 VITALS — BP 108/70 | HR 90 | Ht 72.0 in | Wt 303.7 lb

## 2023-05-10 DIAGNOSIS — Z7985 Long-term (current) use of injectable non-insulin antidiabetic drugs: Secondary | ICD-10-CM

## 2023-05-10 DIAGNOSIS — E785 Hyperlipidemia, unspecified: Secondary | ICD-10-CM

## 2023-05-10 DIAGNOSIS — I2510 Atherosclerotic heart disease of native coronary artery without angina pectoris: Secondary | ICD-10-CM | POA: Diagnosis not present

## 2023-05-10 DIAGNOSIS — I340 Nonrheumatic mitral (valve) insufficiency: Secondary | ICD-10-CM

## 2023-05-10 DIAGNOSIS — R002 Palpitations: Secondary | ICD-10-CM

## 2023-05-10 DIAGNOSIS — E039 Hypothyroidism, unspecified: Secondary | ICD-10-CM | POA: Diagnosis not present

## 2023-05-10 DIAGNOSIS — E1169 Type 2 diabetes mellitus with other specified complication: Secondary | ICD-10-CM

## 2023-05-10 DIAGNOSIS — E1165 Type 2 diabetes mellitus with hyperglycemia: Secondary | ICD-10-CM | POA: Diagnosis not present

## 2023-05-10 DIAGNOSIS — I10 Essential (primary) hypertension: Secondary | ICD-10-CM

## 2023-05-10 LAB — POCT GLYCOSYLATED HEMOGLOBIN (HGB A1C): Hemoglobin A1C: 6.4 % — AB (ref 4.0–5.6)

## 2023-05-10 MED ORDER — TIRZEPATIDE 5 MG/0.5ML ~~LOC~~ SOAJ: 5.0000 mg | SUBCUTANEOUS | 3 refills | Status: AC

## 2023-05-10 NOTE — Progress Notes (Signed)
 Patient ID: Monica [name], female   DOB: 02/26/1975, 48 y.o.   MRN: [id number]  HPI: Monica [name] is a 48 y.o.-year-old female, returning for follow-up for DM2, dx in 2018 (GDM in 1999), non-insulin-dependent, uncontrolled, without long-term complications. Pt. previously saw Dr. Everardo All, but last visit with me 5 months ago.  Interim history: No increased urination, nausea, chest pain.  She had blurry vision - she saw ophthalmology and they recommended to use reading glasses. She had gastroenteritis with Salmonella and was admitted at the beginning of the year.  She slowly recovered afterwards. She stopped alcohol 06/2022 - continues. She was started on Seroquel for sleep 2 months ago.  This is helping.  She gained approximately 30 pounds after her last visit but started to lose - she  gained a net 15 pounds at today's visit.  Reviewed HbA1c: Lab Results  Component Value Date   HGBA1C 5.7 (A) 12/10/2022   HGBA1C 6.1 (A) 08/07/2022   HGBA1C 6.8 (A) 03/05/2022   HGBA1C 6.7 (A) 11/01/2021   HGBA1C 6.9 (A) 04/19/2021   HGBA1C 7.2 (A) 01/18/2021   HGBA1C 7.4 (A) 10/17/2020   HGBA1C 6.5 (A) 06/20/2020   HGBA1C 6.6 (A) 01/08/2020   HGBA1C 7.3 (A) 09/15/2019   Pt is on a regimen of: - Mounjaro 5 mg weekly-tolerated well She tried metformin but this caused abdominal pain. She tried Victoza but this caused nausea. She was also on Trulicity. She tried [place] but this caused vaginitis. She was previously on insulin between 2020 and 2021. She was previously on repaglinide, but ran out 10/2022 She had poor night vision at night on Ozempic.  Pt checks her sugars 0-1x a day and they are: - am: 100-120, 130 (if eating late) >> 100-120 >> 100-123 - 2h after b'fast: n/c - before lunch: n/c >> 140s >> n/c - 2h after lunch: n/c >> 130-170 >> 114-134 - before dinner: 130s >> n/c - 2h after dinner: up to 170 >> 130-140, 190 >> 94-138 - bedtime: n/c - nighttime: n/c Lowest sugar was 86 >> .Marland Kitchen.  100 >> 94; she has hypoglycemia awareness <90.  Highest sugar was 400s (Prednisone) >> 170 >> 190 >> 154.  Glucometer: One Touch Verio  - no CKD, last BUN/creatinine:  Lab Results  Component Value Date   BUN 9 04/29/2023   BUN 17 04/04/2022   CREATININE 0.91 04/29/2023   CREATININE 1.08 04/04/2022   Lab Results  Component Value Date   MICRALBCREAT 1.3 12/10/2022   MICRALBCREAT 1.8 01/05/2022   MICRALBCREAT 0.9 12/26/2018   MICRALBCREAT 0.6 08/07/2017   MICRALBCREAT 0.6 08/13/2016  On olmesartan 40 mg daily.  -+ HL; last set of lipids: Lab Results  Component Value Date   CHOL 165 04/29/2023   HDL 59.60 04/29/2023   LDLCALC 89 04/29/2023   TRIG 79.0 04/29/2023   CHOLHDL 3 04/29/2023  On lovastatin 20 mg daily. 05/10/2021: CAC score = 0.  - last eye exam was 07/2022. No DR.   - no numbness and tingling in her feet.  Last foot exam 06/15/2022 by Dr. Eloy End with podiatry.  She also has a history of HTN, ?OSA, GERD, asthma.  ROS: + see HPI  Past Medical History:  Diagnosis Date   Allergy    Anemia    Anxiety 1992   Arthritis    Asthma    CAD (coronary artery disease) 04/23/2023   CCTA 04/20/21: pRCA mild soft plaque < 25, CAC score 0 TTE 04/20/21: EF 55-60, no RWMA,  GLS -22.7, NL RVSF, RVSP 29.7, mild MR, RAP 8     Depression 2024   Diabetes mellitus without complication (HCC) 04/2016   GERD (gastroesophageal reflux disease)    Hypertension    OSA (obstructive sleep apnea)    Sleep apnea    no cpap - told no OSA after told did so unsure if + or not    Substance abuse (HCC) 6.1.2024   Alcohol   Past Surgical History:  Procedure Laterality Date   ABDOMINAL HYSTERECTOMY  02/19/2006   BICEPS TENDON REPAIR Bilateral    FOOT SURGERY     Left   KNEE ARTHROSCOPY Bilateral    ROTATOR CUFF REPAIR Bilateral    Social History   Socioeconomic History   Marital status: Married    Spouse name: Not on file   Number of children: 1   Years of education: Not on file    Highest education level: Master's degree (e.g., MA, MS, MEng, MEd, MSW, MBA)  Occupational History   Occupation: Solicitor  Tobacco Use   Smoking status: Some Days    Current packs/day: 0.00    Average packs/day: 0.3 packs/day for 15.0 years (3.8 ttl pk-yrs)    Types: Cigarettes    Last attempt to quit: 11/25/2018    Years since quitting: 4.4   Smokeless tobacco: Never   Tobacco comments:    down to 3 cigarettes per day  Vaping Use   Vaping status: Never Used  Substance and Sexual Activity   Alcohol use: Not Currently    Comment: socially   Drug use: No   Sexual activity: Yes    Birth control/protection: Abstinence, Surgical  Other Topics Concern   Not on file  Social History Narrative   Not on file   Social Drivers of Health   Financial Resource Strain: Low Risk  (01/28/2023)   Overall Financial Resource Strain (CARDIA)    Difficulty of Paying Living Expenses: Not very hard  Food Insecurity: No Food Insecurity (01/28/2023)   Hunger Vital Sign    Worried About Running Out of Food in the Last Year: Never true    Ran Out of Food in the Last Year: Never true  Transportation Needs: No Transportation Needs (01/28/2023)   PRAPARE - Administrator, Civil Service (Medical): No    Lack of Transportation (Non-Medical): No  Physical Activity: Insufficiently Active (01/28/2023)   Exercise Vital Sign    Days of Exercise per Week: 3 days    Minutes of Exercise per Session: 30 min  Stress: Stress Concern Present (01/28/2023)   [employer] of Occupational Health - Occupational Stress Questionnaire    Feeling of Stress : Very much  Social Connections: Moderately Isolated (01/28/2023)   Social Connection and Isolation Panel [NHANES]    Frequency of Communication with Friends and Family: Never    Frequency of Social Gatherings with Friends and Family: Never    Attends Religious Services: Never    Database administrator or Organizations: Yes    Attends Probation officer: More than 4 times per year    Marital Status: Married  Catering manager Violence: Not At Risk (03/22/2022)   Humiliation, Afraid, Rape, and Kick questionnaire    Fear of Current or Ex-Partner: No    Emotionally Abused: No    Physically Abused: No    Sexually Abused: No   Current Outpatient Medications on File Prior to Visit  Medication Sig Dispense Refill   acetaminophen (TYLENOL) 650 MG  CR tablet Take 1,300 mg by mouth 2 (two) times daily as needed for pain.     albuterol (VENTOLIN HFA) 108 (90 Base) MCG/ACT inhaler INHALE 2 PUFFS INTO THE LUNGS EVERY 6 HOURS AS NEEDED FOR WHEEZING OR SHORTNESS OF BREATH 8.5 g 3   amLODipine (NORVASC) 2.5 MG tablet Take 1 tablet (2.5 mg total) by mouth daily. 90 tablet 2   BAYER ASPIRIN EC LOW DOSE 81 MG tablet Take 81 mg by mouth in the morning. Swallow whole.     Blood Glucose Monitoring Suppl (ONETOUCH VERIO) w/Device KIT Use to check blood sugar once a day. 1 kit 0   fluticasone-salmeterol (ADVAIR DISKUS) 250-50 MCG/ACT AEPB Inhale 1 puff into the lungs in the morning and at bedtime. 60 each 2   glucose blood (ONETOUCH VERIO) test strip Use to check blood sugar once a day. 100 strip 3   hydrOXYzine (VISTARIL) 25 MG capsule Take 1 to 2 capsules by mouth at bedtime as needed. 180 capsule 2   ibuprofen (ADVIL) 200 MG tablet Take 400 mg by mouth every 6 (six) hours as needed for mild pain or headache.     Lancets (ONETOUCH DELICA PLUS LANCET33G) MISC USE TO TEST BLOOD SUGAR EVERYDAY 100 each 5   levocetirizine (XYZAL) 5 MG tablet TAKE 1 TABLET(5 MG) BY MOUTH EVERY EVENING 90 tablet 2   lovastatin (MEVACOR) 20 MG tablet TAKE 1 TABLET(20 MG) BY MOUTH AT BEDTIME 90 tablet 2   mometasone (NASONEX) 50 MCG/ACT nasal spray SHAKE LIQUID AND USE 2 SPRAYS IN EACH NOSTRIL DAILY 17 g 2   montelukast (SINGULAIR) 10 MG tablet Take 1 tablet (10 mg total) by mouth at bedtime. 90 tablet 2   olmesartan-hydrochlorothiazide (BENICAR HCT) 40-25 MG tablet  TAKE 1 TABLET BY MOUTH DAILY 90 tablet 1   omeprazole (PRILOSEC) 40 MG capsule TAKE 1 CAPSULE(40 MG) BY MOUTH DAILY 90 capsule 2   PARoxetine (PAXIL) 20 MG tablet Take 1 tablet (20 mg total) by mouth daily.     tirzepatide [facility]) 5 MG/0.5ML Pen Inject 5 mg into the skin once a week. 6 mL 3   No current facility-administered medications on file prior to visit.   Allergies  Allergen Reactions   Codeine Other (See Comments)    Unable to Urinate    Family History  Problem Relation Age of Onset   Hyperlipidemia Mother    Stroke Mother    Heart disease Mother    Hypertension Mother    Diabetes Mother    Colon polyps Mother        TA+   Arthritis Mother    Hyperlipidemia Father    Heart disease Father    Stroke Father    Hypertension Father    Diabetes Father    Colon cancer Father        Dx'd in his 42's    Cancer Father    Early death Father    Diabetes Brother    Anxiety disorder Brother    Depression Brother    Drug abuse Brother    Learning disabilities Brother    Vision loss Brother    Hypertension Maternal Grandmother    Diabetes Mellitus II Maternal Grandmother    Colon cancer Paternal Uncle    Alcohol abuse Maternal Uncle    Alcohol abuse Maternal Uncle    Esophageal cancer Neg Hx    Rectal cancer Neg Hx    Stomach cancer Neg Hx    PE: BP 124/70   Pulse Marland Kitchen)  103   Ht 6' (1.829 m)   Wt (!) 307 lb 12.8 oz (139.6 kg)   SpO2 98%   BMI 41.75 kg/m  Wt Readings from Last 10 Encounters:  05/10/23 (!) 307 lb 12.8 oz (139.6 kg)  04/29/23 (!) 320 lb 2 oz (145.2 kg)  12/10/22 292 lb (132.5 kg)  09/14/22 298 lb 9.6 oz (135.4 kg)  09/13/22 298 lb 9.6 oz (135.4 kg)  08/07/22 286 lb 12.8 oz (130.1 kg)  07/30/22 283 lb (128.4 kg)  07/09/22 296 lb (134.3 kg)  06/25/22 295 lb 9.6 oz (134.1 kg)  06/04/22 (!) 304 lb 4 oz (138 kg)   Constitutional: obese, in NAD Eyes: no exophthalmos ENT:  no thyromegaly, no cervical lymphadenopathy Cardiovascular: tachycardia,  RR, No MRG Respiratory: CTA B Musculoskeletal: no deformities Skin: no rashes Neurological: no tremor with outstretched hands  ASSESSMENT: 1. DM2, non-insulin-dependent, uncontrolled, without long-term complications  2. HL  3.  History of hyperthyroidism  PLAN:  1. Patient with longstanding, previously uncontrolled type 2 diabetes, with improvement in control lately.  At last visit, HbA1c was excellent, improved, to 5.7%.  She continues only on Mounjaro.  She had problems obtaining Mounjaro in the past, but the PA was approved in 10/2022 until 10/2025.  At last visit she returned after running out of repaglinide approximately 1 prior to the visit but sugars were at goal so we did not add it back.  I did advise her to let me know if sugars increase during the holidays. -At today's visit, sugars are excellent, improved from before.  I am surprised about the HbA1c which is higher than before.  I did not recommend a change in regimen.  She does have weight gain since last visit, most likely due to Seroquel.  However, she started to lose wt now. - I suggested to:  Patient Instructions  Please continue: - Mounjaro 5 mg weekly  Please return in 4-6 months with your sugar log.   - we checked her HbA1c: 6.4% (higher than before, but higher than expected from her blood sugars at home) - advised to check sugars at different times of the day - 1x a day, rotating check times - advised for yearly eye exams >> she is UTD - she would be due for a foot exam at the end of next month.  She plans to reschedule an appointment with Dr. Donzetta Matters. - return to clinic in 4-6 months  2. HL -Latest lipid panel showed all fractions at goal: Lab Results  Component Value Date   CHOL 165 04/29/2023   HDL 59.60 04/29/2023   LDLCALC 89 04/29/2023   TRIG 79.0 04/29/2023   CHOLHDL 3 04/29/2023  -She continues lovastatin 20 mg daily without side effects  3.  History of hyperthyroidism -Previous treatment-in  2006 -Resolved -TSH reviewed: Lab Results  Component Value Date   TSH 2.25 08/07/2022  -Will continue to keep an eye on her TFTs  Carlus Pavlov, MD PhD [facility]

## 2023-05-10 NOTE — Patient Instructions (Signed)
Please continue: - Mounjaro 5 mg weekly  Please return in 4-6 months with your sugar log.

## 2023-05-10 NOTE — Assessment & Plan Note (Signed)
 LDL cholesterol level is 89. I think her target is less than 100. She is at goal.  - Continue lovastatin 20 mg daily.

## 2023-05-10 NOTE — Assessment & Plan Note (Signed)
 CCTA in 04/2021 with mild soft plaque in the RCA. However, the CAC score was 0.  No chest symptoms to suggest angina. - Continue lovastatin 20 mg daily. - Consider aspirin 81 mg daily.

## 2023-05-10 NOTE — Assessment & Plan Note (Signed)
 Blood pressure controlled. -Continue amlodipine 2.5 mg daily, olmesartan/HCTZ 40/25 mg daily

## 2023-05-10 NOTE — Assessment & Plan Note (Signed)
 Mild on TTE in 04/2021. No symptoms of shortness of breath to suggest worsening. - Plan for a repeat echocardiogram in two years to reassess the mitral valve. - Schedule follow-up visit in two years unless symptoms such as dyspnea or worsening palpitations occur.

## 2023-05-10 NOTE — Assessment & Plan Note (Signed)
 Her symptoms occur at night after taking Seroquel and Lexapro. The dose was just increased this week. EKG shows no arrhythmia, and QT interval is normal. Palpitations may be a side effect of recent dose adjustments. Since her dose was just increased this week. I think it is reasonable to monitor for a couple of weeks to see if her symptoms resolve. If not, we can get a 2 week Zio monitor at that point. I also asked her to check with her psychiatrist to see if this is a common side effect after increasing the dose.  - If symptoms continue or her psychiatrist notes that these are unusual side effects, she will contact me to arrange a 2 weeks Zio monitor.

## 2023-05-10 NOTE — Patient Instructions (Signed)
 Medication Instructions:  Your physician recommends that you continue on your current medications as directed. Please refer to the Current Medication list given to you today.  *If you need a refill on your cardiac medications before your next appointment, please call your pharmacy*   Lab Work: none If you have labs (blood work) drawn today and your tests are completely normal, you will receive your results only by: MyChart Message (if you have MyChart) OR A paper copy in the mail If you have any lab test that is abnormal or we need to change your treatment, we will call you to review the results.   Testing/Procedures: Your physician has requested that you have an echocardiogram. To be done in 2 years prior to appointment.  Echocardiography is a painless test that uses sound waves to create images of your heart. It provides your doctor with information about the size and shape of your heart and how well your heart's chambers and valves are working. This procedure takes approximately one hour. There are no restrictions for this procedure. Please do NOT wear cologne, perfume, aftershave, or lotions (deodorant is allowed). Please arrive 15 minutes prior to your appointment time.  Please note: We ask at that you not bring children with you during ultrasound (echo/ vascular) testing. Due to room size and safety concerns, children are not allowed in the ultrasound rooms during exams. Our front office staff cannot provide observation of children in our lobby area while testing is being conducted. An adult accompanying a patient to their appointment will only be allowed in the ultrasound room at the discretion of the ultrasound technician under special circumstances. We apologize for any inconvenience.    Follow-Up: At [facility], you and your health needs are our priority.  As part of our continuing mission to provide you with exceptional heart care, we have created designated Provider  Care Teams.  These Care Teams include your primary Cardiologist (physician) and Advanced Practice Providers (APPs -  Physician Assistants and Nurse Practitioners) who all work together to provide you with the care you need, when you need it.  We recommend signing up for the patient portal called "MyChart".  Sign up information is provided on this After Visit Summary.  MyChart is used to connect with patients for Virtual Visits (Telemedicine).  Patients are able to view lab/test results, encounter notes, upcoming appointments, etc.  Non-urgent messages can be sent to your provider as well.   To learn more about what you can do with MyChart, go to [email or web address].    Your next appointment:   2 year(s)  Provider:   Tereso Newcomer, PA-C         Other Instructions

## 2023-05-23 ENCOUNTER — Ambulatory Visit
Admission: RE | Admit: 2023-05-23 | Discharge: 2023-05-23 | Disposition: A | Discharge status: Home / Self Care | Source: Ambulatory Visit | Attending: Family Medicine | Admitting: Family Medicine

## 2023-05-23 DIAGNOSIS — Z1231 Encounter for screening mammogram for malignant neoplasm of breast: Secondary | ICD-10-CM

## 2023-07-10 ENCOUNTER — Ambulatory Visit: Admitting: Pulmonary Disease

## 2023-07-27 ENCOUNTER — Other Ambulatory Visit: Payer: Self-pay | Admitting: Family Medicine

## 2023-07-27 DIAGNOSIS — J4520 Mild intermittent asthma, uncomplicated: Secondary | ICD-10-CM

## 2023-09-09 ENCOUNTER — Ambulatory Visit: Admitting: Family Medicine

## 2023-09-26 ENCOUNTER — Ambulatory Visit: Admitting: Pulmonary Disease

## 2023-10-09 ENCOUNTER — Ambulatory Visit: Admitting: Internal Medicine

## 2023-10-11 ENCOUNTER — Other Ambulatory Visit: Payer: Self-pay | Admitting: Family Medicine

## 2023-10-25 ENCOUNTER — Ambulatory Visit: Admitting: Internal Medicine

## 2023-10-30 ENCOUNTER — Ambulatory Visit: Admitting: Family Medicine

## 2023-11-14 ENCOUNTER — Ambulatory Visit: Admitting: Internal Medicine

## 2023-11-18 ENCOUNTER — Ambulatory Visit: Admitting: Family Medicine

## 2023-11-25 ENCOUNTER — Ambulatory Visit: Admitting: Internal Medicine

## 2023-11-25 NOTE — Progress Notes (Deleted)
 Patient ID: Monica [name], female   DOB: Feb 26, 1975, 48 y.o.   MRN: [id number]  HPI: Monica [name] is a 48 y.o.-year-old female, returning for follow-up for DM2, dx in 2018 (GDM in 1999), non-insulin -dependent, uncontrolled, without long-term complications. Pt. previously saw Monica [name], but last visit with me 6.5 months ago.  Interim history: No increased urination, nausea, chest pain.  She had blurry vision - she saw ophthalmology and they recommended to use reading glasses. She stopped alcohol 06/2022 - continues. She was started on Seroquel for sleep 2 months prior to our last visit.  She gained 15 pounds afterwards.  Reviewed HbA1c: Lab Results  Component Value Date   HGBA1C 6.4 (A) 05/10/2023   HGBA1C 5.7 (A) 12/10/2022   HGBA1C 6.1 (A) 08/07/2022   HGBA1C 6.8 (A) 03/05/2022   HGBA1C 6.7 (A) 11/01/2021   HGBA1C 6.9 (A) 04/19/2021   HGBA1C 7.2 (A) 01/18/2021   HGBA1C 7.4 (A) 10/17/2020   HGBA1C 6.5 (A) 06/20/2020   HGBA1C 6.6 (A) 01/08/2020   Pt is on a regimen of: - Mounjaro  5 mg weekly-tolerated well She tried metformin  but this caused abdominal pain. She tried Victoza  but this caused nausea. She was also on Trulicity . She tried Jardiance  but this caused vaginitis. She was previously on insulin  between 2020 and 2021. She was previously on repaglinide , but ran out 10/2022 She had poor night vision at night on Ozempic .  Pt checks her sugars 0-1x a day and they are: - am: 100-120, 130 (if eating late) >> 100-120 >> 100-123 - 2h after b'fast: n/c - before lunch: n/c >> 140s >> n/c - 2h after lunch: n/c >> 130-170 >> 114-134 - before dinner: 130s >> n/c - 2h after dinner: up to 170 >> 130-140, 190 >> 94-138 - bedtime: n/c - nighttime: n/c Lowest sugar was 86 >> .Monica [name]. 100 >> 94; she has hypoglycemia awareness <90.  Highest sugar was 400s (Prednisone ) >> 170 >> 190 >> 154.  Glucometer: One Touch Verio  - no CKD, last BUN/creatinine:  Lab Results  Component Value Date    BUN 9 04/29/2023   BUN 17 04/04/2022   CREATININE 0.91 04/29/2023   CREATININE 1.08 04/04/2022   No results found for: MICRALBCREAT On olmesartan  40 mg daily.  -+ HL; last set of lipids: Lab Results  Component Value Date   CHOL 165 04/29/2023   HDL 59.60 04/29/2023   LDLCALC 89 04/29/2023   TRIG 79.0 04/29/2023   CHOLHDL 3 04/29/2023  On lovastatin  20 mg daily. 05/10/2021: CAC score = 0.  - last eye exam was 07/2022. No DR.   - no numbness and tingling in her feet.  Last foot exam 06/15/2022 by Monica [name] with podiatry.  She also has a history of HTN, ?OSA, GERD, asthma.  ROS: + see HPI  Past Medical History:  Diagnosis Date   Allergy    Anemia    Anxiety 1992   Arthritis    Asthma    CAD (coronary artery disease) 04/23/2023   CCTA 04/20/21: pRCA mild soft plaque < 25, CAC score 0 TTE 04/20/21: EF 55-60, no RWMA, GLS -22.7, NL RVSF, RVSP 29.7, mild MR, RAP 8     Depression 2024   Diabetes mellitus without complication (HCC) 04/2016   GERD (gastroesophageal reflux disease)    Hypertension    OSA (obstructive sleep apnea)    Sleep apnea    no cpap - told no OSA after told did so unsure if + or not  Substance abuse (HCC) 6.1.2024   Alcohol   Past Surgical History:  Procedure Laterality Date   ABDOMINAL HYSTERECTOMY  02/19/2006   BICEPS TENDON REPAIR Bilateral    FOOT SURGERY     Left   KNEE ARTHROSCOPY Bilateral    ROTATOR CUFF REPAIR Bilateral    Social History   Socioeconomic History   Marital status: Married    Spouse name: Not on file   Number of children: 1   Years of education: Not on file   Highest education level: Master's degree (e.g., MA, MS, MEng, MEd, MSW, MBA)  Occupational History   Occupation: Solicitor  Tobacco Use   Smoking status: Some Days    Current packs/day: 0.00    Average packs/day: 0.3 packs/day for 15.0 years (3.8 ttl pk-yrs)    Types: Cigarettes    Last attempt to quit: 11/25/2018    Years since  quitting: 5.0   Smokeless tobacco: Never   Tobacco comments:    down to 3 cigarettes per day  Vaping Use   Vaping status: Never Used  Substance and Sexual Activity   Alcohol use: Not Currently    Comment: socially   Drug use: No   Sexual activity: Yes    Birth control/protection: Abstinence, Surgical  Other Topics Concern   Not on file  Social History Narrative   Not on file   Social Drivers of Health   Financial Resource Strain: Low Risk  (01/28/2023)   Overall Financial Resource Strain (CARDIA)    Difficulty of Paying Living Expenses: Not very hard  Food Insecurity: No Food Insecurity (01/28/2023)   Hunger Vital Sign    Worried About Running Out of Food in the Last Year: Never true    Ran Out of Food in the Last Year: Never true  Transportation Needs: No Transportation Needs (01/28/2023)   PRAPARE - Administrator, Civil Service (Medical): No    Lack of Transportation (Non-Medical): No  Physical Activity: Insufficiently Active (01/28/2023)   Exercise Vital Sign    Days of Exercise per Week: 3 days    Minutes of Exercise per Session: 30 min  Stress: Stress Concern Present (01/28/2023)   [employer] of Occupational Health - Occupational Stress Questionnaire    Feeling of Stress : Very much  Social Connections: Moderately Isolated (01/28/2023)   Social Connection and Isolation Panel    Frequency of Communication with Friends and Family: Never    Frequency of Social Gatherings with Friends and Family: Never    Attends Religious Services: Never    Database administrator or Organizations: Yes    Attends Engineer, structural: More than 4 times per year    Marital Status: Married  Catering manager Violence: Not At Risk (03/22/2022)   Humiliation, Afraid, Rape, and Kick questionnaire    Fear of Current or Ex-Partner: No    Emotionally Abused: No    Physically Abused: No    Sexually Abused: No   Current Outpatient Medications on File Prior to Visit   Medication Sig Dispense Refill   acetaminophen  (TYLENOL ) 650 MG CR tablet Take 1,300 mg by mouth 2 (two) times daily as needed for pain.     albuterol  (VENTOLIN  HFA) 108 (90 Base) MCG/ACT inhaler INHALE 2 PUFFS INTO THE LUNGS EVERY 6 HOURS AS NEEDED FOR WHEEZING OR SHORTNESS OF BREATH 8.5 g 3   amLODipine  (NORVASC ) 2.5 MG tablet TAKE 1 TABLET(2.5 MG) BY MOUTH DAILY 90 tablet 2   BAYER ASPIRIN  EC LOW DOSE 81 MG tablet Take 81 mg by mouth in the morning. Swallow whole.     Blood Glucose Monitoring Suppl (ONETOUCH VERIO) w/Device KIT Use to check blood sugar once a day. 1 kit 0   escitalopram (LEXAPRO) 20 MG tablet Take 20 mg by mouth daily.     glucose blood (ONETOUCH VERIO) test strip Use to check blood sugar once a day. 100 strip 3   hydrOXYzine  (VISTARIL ) 25 MG capsule Take 1 to 2 capsules by mouth at bedtime as needed. 180 capsule 2   ibuprofen  (ADVIL ) 200 MG tablet Take 400 mg by mouth every 6 (six) hours as needed for mild pain or headache.     Lancets (ONETOUCH DELICA PLUS LANCET33G) MISC USE TO TEST BLOOD SUGAR EVERYDAY 100 each 5   levocetirizine (XYZAL ) 5 MG tablet TAKE 1 TABLET(5 MG) BY MOUTH EVERY EVENING 90 tablet 2   lovastatin  (MEVACOR ) 20 MG tablet TAKE 1 TABLET(20 MG) BY MOUTH AT BEDTIME 90 tablet 2   mometasone  (NASONEX ) 50 MCG/ACT nasal spray SHAKE LIQUID AND USE 2 SPRAYS IN EACH NOSTRIL DAILY 17 g 2   montelukast  (SINGULAIR ) 10 MG tablet Take 1 tablet (10 mg total) by mouth at bedtime. 90 tablet 2   olmesartan -hydrochlorothiazide  (BENICAR  HCT) 40-25 MG tablet TAKE 1 TABLET BY MOUTH DAILY 90 tablet 3   omeprazole  (PRILOSEC) 40 MG capsule TAKE 1 CAPSULE(40 MG) BY MOUTH DAILY 90 capsule 2   PARoxetine  (PAXIL ) 20 MG tablet Take 1 tablet (20 mg total) by mouth daily.     QUEtiapine (SEROQUEL) 200 MG tablet Take 200 mg by mouth at bedtime.     tirzepatide  (MOUNJARO ) 5 MG/0.5ML Pen Inject 5 mg into the skin once a week. 6 mL 3   WIXELA INHUB 250-50 MCG/ACT AEPB INHALE 1 PUFF INTO THE  LUNGS IN THE MORNING AND AT BEDTIME 60 each 2   No current facility-administered medications on file prior to visit.   Allergies  Allergen Reactions   Codeine Other (See Comments)    Unable to Urinate    Family History  Problem Relation Age of Onset   Hyperlipidemia Mother    Stroke Mother    Heart disease Mother    Hypertension Mother    Diabetes Mother    Colon polyps Mother        TA+   Arthritis Mother    Hyperlipidemia Father    Heart disease Father    Stroke Father    Hypertension Father    Diabetes Father    Colon cancer Father        Dx'd in his 55's    Cancer Father    Early death Father    Alcohol abuse Maternal Uncle    Alcohol abuse Maternal Uncle    Colon cancer Paternal Uncle    Hypertension Maternal Grandmother    Diabetes Mellitus II Maternal Grandmother    Diabetes Brother    Anxiety disorder Brother    Depression Brother    Drug abuse Brother    Learning disabilities Brother    Vision loss Brother    Esophageal cancer Neg Hx    Rectal cancer Neg Hx    Stomach cancer Neg Hx    Breast cancer Neg Hx    BRCA 1/2 Neg Hx    PE: There were no vitals taken for this visit. Wt Readings from Last 10 Encounters:  05/10/23 (!) 303 lb 11.2 oz (137.8 kg)  05/10/23 (!) 307 lb 12.8 oz (139.6 kg)  04/29/23 (!) 320 lb 2 oz (145.2 kg)  12/10/22 292 lb (132.5 kg)  09/14/22 298 lb 9.6 oz (135.4 kg)  09/13/22 298 lb 9.6 oz (135.4 kg)  08/07/22 286 lb 12.8 oz (130.1 kg)  07/30/22 283 lb (128.4 kg)  07/09/22 296 lb (134.3 kg)  06/25/22 295 lb 9.6 oz (134.1 kg)   Constitutional: obese, in NAD Eyes: no exophthalmos ENT:  no thyromegaly, no cervical lymphadenopathy Cardiovascular: tachycardia, RR, No MRG Respiratory: CTA B Musculoskeletal: no deformities Skin: no rashes Neurological: no tremor with outstretched hands  ASSESSMENT: 1. DM2, non-insulin -dependent, uncontrolled, without long-term complications  2. HL  3.  History of hyperthyroidism  PLAN:   1. Patient with longstanding, uncontrolled lately.  HbA1c at last visit was still at goal, slightly higher than 5.7% previously.  She is only on GLP-1/GIP receptor agonist.  She had problems obtaining Mounjaro  in the past but this was approved with a PA in 10/2022 (until 10/2025).  At last visit sugars were excellent, improved, and the HbA1c appeared to be slightly higher than expected from blood sugars at home.  I did not suggest a change in regimen.  She did have weight gain since the previous visit, most likely due to Seroquel.   - I suggested to:  Patient Instructions  Please continue: - Mounjaro  5 mg weekly  Please return in 4-6 months with your sugar log.   - we checked her HbA1c: 7%  - advised to check sugars at different times of the day - 1x a day, rotating check times - advised for yearly eye exams >> she is UTD - return to clinic in 4-6 months  2. HL - Latest lipid panel showed all fractions at goal: Lab Results  Component Value Date   CHOL 165 04/29/2023   HDL 59.60 04/29/2023   LDLCALC 89 04/29/2023   TRIG 79.0 04/29/2023   CHOLHDL 3 04/29/2023  -She continues on atorvastatin 20 mg daily without side effects  3.  History of hyperthyroidism - Previous treatments-in 2006 - Resolved - Latest TSH reviewed and this was normal: Lab Results  Component Value Date   TSH 2.25 08/07/2022  - Will continue to keep an eye on her TFTs  Lela Fendt, MD PhD [facility] Endocrinology

## 2023-11-30 ENCOUNTER — Other Ambulatory Visit: Payer: Self-pay | Admitting: Family Medicine

## 2023-11-30 DIAGNOSIS — J4520 Mild intermittent asthma, uncomplicated: Secondary | ICD-10-CM

## 2023-12-04 ENCOUNTER — Encounter: Payer: Self-pay | Admitting: Family Medicine

## 2023-12-04 ENCOUNTER — Ambulatory Visit: Admitting: Family Medicine

## 2023-12-04 NOTE — Telephone Encounter (Signed)
 If problem is not well controlled, she may need EGD. GI referral can be placed. Thanks, BJ

## 2023-12-16 ENCOUNTER — Ambulatory Visit: Admitting: Family Medicine

## 2023-12-16 ENCOUNTER — Ambulatory Visit: Admitting: Internal Medicine

## 2023-12-18 ENCOUNTER — Ambulatory Visit: Payer: Self-pay | Admitting: Podiatry

## 2023-12-19 ENCOUNTER — Ambulatory Visit: Payer: Self-pay | Admitting: Podiatry

## 2023-12-25 ENCOUNTER — Other Ambulatory Visit: Payer: Self-pay | Admitting: Family Medicine

## 2023-12-25 DIAGNOSIS — K219 Gastro-esophageal reflux disease without esophagitis: Secondary | ICD-10-CM
